# Patient Record
Sex: Female | Born: 1935
Health system: Southern US, Community
[De-identification: ages and names within clinical notes are randomized; demographics above are authoritative.]

## PROBLEM LIST (undated history)

## (undated) DIAGNOSIS — F419 Anxiety disorder, unspecified: Secondary | ICD-10-CM

## (undated) DIAGNOSIS — G629 Polyneuropathy, unspecified: Secondary | ICD-10-CM

## (undated) DIAGNOSIS — F32A Depression, unspecified: Secondary | ICD-10-CM

## (undated) DIAGNOSIS — E785 Hyperlipidemia, unspecified: Secondary | ICD-10-CM

## (undated) DIAGNOSIS — I6529 Occlusion and stenosis of unspecified carotid artery: Secondary | ICD-10-CM

## (undated) DIAGNOSIS — D649 Anemia, unspecified: Secondary | ICD-10-CM

## (undated) DIAGNOSIS — I4892 Unspecified atrial flutter: Secondary | ICD-10-CM

## (undated) DIAGNOSIS — K222 Esophageal obstruction: Secondary | ICD-10-CM

## (undated) DIAGNOSIS — E079 Disorder of thyroid, unspecified: Secondary | ICD-10-CM

## (undated) DIAGNOSIS — E669 Obesity, unspecified: Secondary | ICD-10-CM

## (undated) DIAGNOSIS — IMO0002 Reserved for concepts with insufficient information to code with codable children: Secondary | ICD-10-CM

## (undated) DIAGNOSIS — I10 Essential (primary) hypertension: Secondary | ICD-10-CM

## (undated) DIAGNOSIS — H348112 Central retinal vein occlusion, right eye, stable: Secondary | ICD-10-CM

## (undated) DIAGNOSIS — J449 Chronic obstructive pulmonary disease, unspecified: Secondary | ICD-10-CM

## (undated) DIAGNOSIS — K219 Gastro-esophageal reflux disease without esophagitis: Secondary | ICD-10-CM

## (undated) DIAGNOSIS — F329 Major depressive disorder, single episode, unspecified: Secondary | ICD-10-CM

## (undated) DIAGNOSIS — N189 Chronic kidney disease, unspecified: Secondary | ICD-10-CM

## (undated) DIAGNOSIS — I5032 Chronic diastolic (congestive) heart failure: Secondary | ICD-10-CM

## (undated) DIAGNOSIS — K8020 Calculus of gallbladder without cholecystitis without obstruction: Secondary | ICD-10-CM

## (undated) DIAGNOSIS — K449 Diaphragmatic hernia without obstruction or gangrene: Secondary | ICD-10-CM

## (undated) DIAGNOSIS — I272 Pulmonary hypertension, unspecified: Secondary | ICD-10-CM

## (undated) DIAGNOSIS — C801 Malignant (primary) neoplasm, unspecified: Secondary | ICD-10-CM

## (undated) DIAGNOSIS — I350 Nonrheumatic aortic (valve) stenosis: Secondary | ICD-10-CM

## (undated) HISTORY — DX: Malignant (primary) neoplasm, unspecified: C80.1

## (undated) HISTORY — DX: Polyneuropathy, unspecified: G62.9

## (undated) HISTORY — PX: ABDOMINAL HYSTERECTOMY: SHX81

## (undated) HISTORY — PX: PR VEIN BYPASS GRAFT,AORTO-FEM-POP: 35551

## (undated) HISTORY — DX: Depression, unspecified: F32.A

## (undated) HISTORY — DX: Disorder of thyroid, unspecified: E07.9

## (undated) HISTORY — PX: HIP SURGERY: SHX245

## (undated) HISTORY — DX: Nonrheumatic aortic (valve) stenosis: I35.0

## (undated) HISTORY — PX: BREAST LUMPECTOMY: SHX2

## (undated) HISTORY — DX: Gastro-esophageal reflux disease without esophagitis: K21.9

## (undated) HISTORY — DX: Obesity, unspecified: E66.9

## (undated) HISTORY — DX: Essential (primary) hypertension: I10

## (undated) HISTORY — PX: CHOLECYSTECTOMY: SHX55

## (undated) HISTORY — DX: Esophageal obstruction: K22.2

## (undated) HISTORY — DX: Chronic obstructive pulmonary disease, unspecified: J44.9

## (undated) HISTORY — DX: Calculus of gallbladder without cholecystitis without obstruction: K80.20

## (undated) HISTORY — DX: Hyperlipidemia, unspecified: E78.5

## (undated) HISTORY — DX: Reserved for concepts with insufficient information to code with codable children: IMO0002

## (undated) HISTORY — DX: Major depressive disorder, single episode, unspecified: F32.9

## (undated) HISTORY — DX: Anxiety disorder, unspecified: F41.9

## (undated) HISTORY — DX: Occlusion and stenosis of unspecified carotid artery: I65.29

## (undated) HISTORY — DX: Unspecified atrial flutter: I48.92

## (undated) HISTORY — DX: Pulmonary hypertension, unspecified: I27.20

## (undated) HISTORY — DX: Chronic diastolic (congestive) heart failure: I50.32

## (undated) HISTORY — DX: Diaphragmatic hernia without obstruction or gangrene: K44.9

## (undated) HISTORY — DX: Anemia, unspecified: D64.9

## (undated) HISTORY — DX: Central retinal vein occlusion, right eye, stable: H34.8112

## (undated) HISTORY — DX: Chronic kidney disease, unspecified: N18.9

---

## 1992-03-09 DIAGNOSIS — C801 Malignant (primary) neoplasm, unspecified: Secondary | ICD-10-CM

## 1992-03-09 HISTORY — DX: Malignant (primary) neoplasm, unspecified: C80.1

## 1997-08-30 ENCOUNTER — Ambulatory Visit: Admission: RE | Admit: 1997-08-30 | Discharge: 1997-08-30 | Payer: Self-pay | Admitting: Vascular Surgery

## 1997-08-31 ENCOUNTER — Inpatient Hospital Stay: Admission: RE | Admit: 1997-08-31 | Discharge: 1997-09-04 | Payer: Self-pay | Admitting: Vascular Surgery

## 1997-10-23 ENCOUNTER — Observation Stay (HOSPITAL_COMMUNITY): Admission: RE | Admit: 1997-10-23 | Discharge: 1997-10-24 | Payer: Self-pay | Admitting: Vascular Surgery

## 1997-10-29 ENCOUNTER — Inpatient Hospital Stay (HOSPITAL_COMMUNITY): Admission: RE | Admit: 1997-10-29 | Discharge: 1997-11-02 | Payer: Self-pay | Admitting: Vascular Surgery

## 1998-02-04 ENCOUNTER — Ambulatory Visit (HOSPITAL_COMMUNITY): Admission: RE | Admit: 1998-02-04 | Discharge: 1998-02-04 | Payer: Self-pay | Admitting: Vascular Surgery

## 1998-02-04 ENCOUNTER — Encounter: Payer: Self-pay | Admitting: Vascular Surgery

## 1998-02-05 ENCOUNTER — Ambulatory Visit: Admission: RE | Admit: 1998-02-05 | Discharge: 1998-02-05 | Payer: Self-pay | Admitting: Vascular Surgery

## 1998-02-06 ENCOUNTER — Inpatient Hospital Stay: Admission: RE | Admit: 1998-02-06 | Discharge: 1998-02-09 | Payer: Self-pay | Admitting: Vascular Surgery

## 1998-02-06 ENCOUNTER — Encounter: Payer: Self-pay | Admitting: Vascular Surgery

## 1998-05-23 ENCOUNTER — Encounter: Payer: Self-pay | Admitting: Vascular Surgery

## 1998-05-23 ENCOUNTER — Inpatient Hospital Stay: Admission: RE | Admit: 1998-05-23 | Discharge: 1998-05-27 | Payer: Self-pay | Admitting: Vascular Surgery

## 1998-08-19 ENCOUNTER — Inpatient Hospital Stay: Admission: RE | Admit: 1998-08-19 | Discharge: 1998-08-27 | Payer: Self-pay | Admitting: Vascular Surgery

## 1998-08-19 ENCOUNTER — Encounter: Payer: Self-pay | Admitting: Vascular Surgery

## 1998-08-22 ENCOUNTER — Encounter: Payer: Self-pay | Admitting: Vascular Surgery

## 1998-09-19 ENCOUNTER — Encounter: Payer: Self-pay | Admitting: Vascular Surgery

## 1998-09-19 ENCOUNTER — Inpatient Hospital Stay: Admission: RE | Admit: 1998-09-19 | Discharge: 1998-09-25 | Payer: Self-pay | Admitting: Vascular Surgery

## 1999-05-07 ENCOUNTER — Encounter: Payer: Self-pay | Admitting: Vascular Surgery

## 1999-05-08 ENCOUNTER — Ambulatory Visit: Admission: RE | Admit: 1999-05-08 | Discharge: 1999-05-08 | Payer: Self-pay | Admitting: Vascular Surgery

## 1999-05-09 ENCOUNTER — Inpatient Hospital Stay: Admission: RE | Admit: 1999-05-09 | Discharge: 1999-05-11 | Payer: Self-pay | Admitting: Vascular Surgery

## 1999-11-13 ENCOUNTER — Encounter: Payer: Self-pay | Admitting: Vascular Surgery

## 1999-11-14 ENCOUNTER — Ambulatory Visit: Admission: RE | Admit: 1999-11-14 | Discharge: 1999-11-14 | Payer: Self-pay | Admitting: Vascular Surgery

## 1999-11-17 ENCOUNTER — Encounter: Payer: Self-pay | Admitting: Vascular Surgery

## 1999-11-17 ENCOUNTER — Inpatient Hospital Stay: Admission: RE | Admit: 1999-11-17 | Discharge: 1999-11-21 | Payer: Self-pay | Admitting: Vascular Surgery

## 2002-04-02 ENCOUNTER — Inpatient Hospital Stay (HOSPITAL_COMMUNITY): Admission: EM | Admit: 2002-04-02 | Discharge: 2002-04-10 | Payer: Self-pay | Admitting: Emergency Medicine

## 2002-04-02 ENCOUNTER — Encounter: Payer: Self-pay | Admitting: Orthopaedic Surgery

## 2002-04-02 ENCOUNTER — Encounter: Payer: Self-pay | Admitting: *Deleted

## 2002-04-03 ENCOUNTER — Encounter: Payer: Self-pay | Admitting: Orthopaedic Surgery

## 2002-04-10 ENCOUNTER — Inpatient Hospital Stay (HOSPITAL_COMMUNITY)
Admission: RE | Admit: 2002-04-10 | Discharge: 2002-04-14 | Payer: Self-pay | Admitting: Physical Medicine & Rehabilitation

## 2004-12-01 ENCOUNTER — Encounter: Admission: RE | Admit: 2004-12-01 | Discharge: 2004-12-01 | Payer: Self-pay | Admitting: Family Medicine

## 2005-06-25 ENCOUNTER — Encounter: Admission: RE | Admit: 2005-06-25 | Discharge: 2005-06-25 | Payer: Self-pay | Admitting: Family Medicine

## 2005-07-31 ENCOUNTER — Encounter: Admission: RE | Admit: 2005-07-31 | Discharge: 2005-07-31 | Payer: Self-pay | Admitting: Family Medicine

## 2005-07-31 ENCOUNTER — Inpatient Hospital Stay (HOSPITAL_COMMUNITY): Admission: EM | Admit: 2005-07-31 | Discharge: 2005-08-07 | Payer: Self-pay | Admitting: Emergency Medicine

## 2005-08-04 ENCOUNTER — Encounter (INDEPENDENT_AMBULATORY_CARE_PROVIDER_SITE_OTHER): Payer: Self-pay | Admitting: *Deleted

## 2005-08-12 ENCOUNTER — Encounter: Admission: RE | Admit: 2005-08-12 | Discharge: 2005-08-12 | Payer: Self-pay | Admitting: Surgery

## 2005-09-07 ENCOUNTER — Encounter: Admission: RE | Admit: 2005-09-07 | Discharge: 2005-09-07 | Payer: Self-pay | Admitting: Surgery

## 2005-11-11 ENCOUNTER — Ambulatory Visit (HOSPITAL_BASED_OUTPATIENT_CLINIC_OR_DEPARTMENT_OTHER): Admission: RE | Admit: 2005-11-11 | Discharge: 2005-11-11 | Payer: Self-pay | Admitting: Surgery

## 2006-03-09 DIAGNOSIS — I4892 Unspecified atrial flutter: Secondary | ICD-10-CM

## 2006-03-09 HISTORY — DX: Unspecified atrial flutter: I48.92

## 2006-04-07 ENCOUNTER — Encounter: Admission: RE | Admit: 2006-04-07 | Discharge: 2006-04-07 | Payer: Self-pay | Admitting: Endocrinology

## 2006-05-10 ENCOUNTER — Inpatient Hospital Stay (HOSPITAL_COMMUNITY): Admission: EM | Admit: 2006-05-10 | Discharge: 2006-05-17 | Payer: Self-pay | Admitting: Emergency Medicine

## 2006-05-11 ENCOUNTER — Encounter (INDEPENDENT_AMBULATORY_CARE_PROVIDER_SITE_OTHER): Payer: Self-pay | Admitting: Specialist

## 2006-07-06 ENCOUNTER — Ambulatory Visit: Payer: Self-pay | Admitting: Vascular Surgery

## 2006-07-21 ENCOUNTER — Encounter: Admission: RE | Admit: 2006-07-21 | Discharge: 2006-07-21 | Payer: Self-pay | Admitting: Endocrinology

## 2006-11-26 ENCOUNTER — Ambulatory Visit: Payer: Self-pay | Admitting: Vascular Surgery

## 2006-12-10 ENCOUNTER — Encounter: Admission: RE | Admit: 2006-12-10 | Discharge: 2006-12-10 | Payer: Self-pay | Admitting: Gastroenterology

## 2007-03-10 DIAGNOSIS — IMO0002 Reserved for concepts with insufficient information to code with codable children: Secondary | ICD-10-CM

## 2007-03-10 HISTORY — DX: Reserved for concepts with insufficient information to code with codable children: IMO0002

## 2007-03-17 ENCOUNTER — Ambulatory Visit (HOSPITAL_COMMUNITY): Admission: RE | Admit: 2007-03-17 | Discharge: 2007-03-18 | Payer: Self-pay | Admitting: Surgery

## 2007-03-17 ENCOUNTER — Encounter (INDEPENDENT_AMBULATORY_CARE_PROVIDER_SITE_OTHER): Payer: Self-pay | Admitting: Surgery

## 2007-06-07 ENCOUNTER — Ambulatory Visit: Payer: Self-pay | Admitting: Vascular Surgery

## 2007-11-09 ENCOUNTER — Ambulatory Visit: Payer: Self-pay | Admitting: Vascular Surgery

## 2008-03-29 ENCOUNTER — Encounter: Admission: RE | Admit: 2008-03-29 | Discharge: 2008-03-29 | Payer: Self-pay | Admitting: Family Medicine

## 2008-05-21 ENCOUNTER — Encounter: Admission: RE | Admit: 2008-05-21 | Discharge: 2008-05-21 | Payer: Self-pay | Admitting: Family Medicine

## 2008-06-15 ENCOUNTER — Ambulatory Visit: Payer: Self-pay | Admitting: Vascular Surgery

## 2008-12-11 ENCOUNTER — Ambulatory Visit: Payer: Self-pay | Admitting: Vascular Surgery

## 2009-02-05 ENCOUNTER — Emergency Department (HOSPITAL_COMMUNITY): Admission: EM | Admit: 2009-02-05 | Discharge: 2009-02-05 | Payer: Self-pay | Admitting: Emergency Medicine

## 2009-02-06 HISTORY — PX: WRIST SURGERY: SHX841

## 2009-02-11 ENCOUNTER — Inpatient Hospital Stay (HOSPITAL_COMMUNITY): Admission: EM | Admit: 2009-02-11 | Discharge: 2009-02-13 | Payer: Self-pay | Admitting: Emergency Medicine

## 2009-03-04 ENCOUNTER — Encounter: Payer: Self-pay | Admitting: Plastic Surgery

## 2009-03-07 ENCOUNTER — Ambulatory Visit (HOSPITAL_COMMUNITY): Admission: RE | Admit: 2009-03-07 | Discharge: 2009-03-07 | Payer: Self-pay | Admitting: Plastic Surgery

## 2009-07-02 ENCOUNTER — Ambulatory Visit: Payer: Self-pay | Admitting: Vascular Surgery

## 2010-01-17 ENCOUNTER — Ambulatory Visit: Payer: Self-pay | Admitting: Vascular Surgery

## 2010-03-30 ENCOUNTER — Encounter: Payer: Self-pay | Admitting: Endocrinology

## 2010-06-09 LAB — BASIC METABOLIC PANEL
BUN: 24 mg/dL — ABNORMAL HIGH (ref 6–23)
Creatinine, Ser: 1.34 mg/dL — ABNORMAL HIGH (ref 0.4–1.2)
GFR calc non Af Amer: 39 mL/min — ABNORMAL LOW (ref >60)

## 2010-06-09 LAB — GLUCOSE, CAPILLARY
Glucose-Capillary: 113 mg/dL — ABNORMAL HIGH (ref 70–99)
Glucose-Capillary: 133 mg/dL — ABNORMAL HIGH (ref 70–99)

## 2010-06-09 LAB — CBC
HCT: 37.2 % (ref 36.0–46.0)
Platelets: 218 10*3/uL (ref 150–400)
RDW: 12.8 % (ref 11.5–15.5)

## 2010-06-10 LAB — BRAIN NATRIURETIC PEPTIDE: Pro B Natriuretic peptide (BNP): 114 pg/mL — ABNORMAL HIGH (ref 0.0–100.0)

## 2010-06-10 LAB — GLUCOSE, CAPILLARY
Glucose-Capillary: 150 mg/dL — ABNORMAL HIGH (ref 70–99)
Glucose-Capillary: 161 mg/dL — ABNORMAL HIGH (ref 70–99)
Glucose-Capillary: 165 mg/dL — ABNORMAL HIGH (ref 70–99)
Glucose-Capillary: 166 mg/dL — ABNORMAL HIGH (ref 70–99)
Glucose-Capillary: 179 mg/dL — ABNORMAL HIGH (ref 70–99)
Glucose-Capillary: 200 mg/dL — ABNORMAL HIGH (ref 70–99)
Glucose-Capillary: 201 mg/dL — ABNORMAL HIGH (ref 70–99)
Glucose-Capillary: 254 mg/dL — ABNORMAL HIGH (ref 70–99)

## 2010-06-10 LAB — DIFFERENTIAL
Basophils Absolute: 0 10*3/uL (ref 0.0–0.1)
Basophils Relative: 0 % (ref 0–1)
Monocytes Relative: 3 % (ref 3–12)
Neutro Abs: 6 10*3/uL (ref 1.7–7.7)
Neutrophils Relative %: 77 % (ref 43–77)

## 2010-06-10 LAB — BASIC METABOLIC PANEL
Calcium: 9 mg/dL (ref 8.4–10.5)
Calcium: 9.7 mg/dL (ref 8.4–10.5)
Creatinine, Ser: 1.63 mg/dL — ABNORMAL HIGH (ref 0.4–1.2)
GFR calc Af Amer: 38 mL/min — ABNORMAL LOW (ref >60)
GFR calc non Af Amer: 31 mL/min — ABNORMAL LOW (ref >60)
GFR calc non Af Amer: 32 mL/min — ABNORMAL LOW (ref >60)
Glucose, Bld: 300 mg/dL — ABNORMAL HIGH (ref 70–99)
Potassium: 4 mEq/L (ref 3.5–5.1)
Sodium: 137 mEq/L (ref 135–145)
Sodium: 139 mEq/L (ref 135–145)

## 2010-06-10 LAB — TSH: TSH: 4.422 u[IU]/mL (ref 0.350–4.500)

## 2010-06-10 LAB — POCT CARDIAC MARKERS: Myoglobin, poc: 140 ng/mL (ref 12–200)

## 2010-06-10 LAB — CBC
MCHC: 34.4 g/dL (ref 30.0–36.0)
Platelets: 202 10*3/uL (ref 150–400)
RBC: 3.61 MIL/uL — ABNORMAL LOW (ref 3.87–5.11)

## 2010-06-10 LAB — PROTIME-INR
INR: 1.16 (ref 0.00–1.49)
Prothrombin Time: 14.7 seconds (ref 11.6–15.2)

## 2010-06-10 LAB — CK TOTAL AND CKMB (NOT AT ARMC): Relative Index: 0.7 (ref 0.0–2.5)

## 2010-06-10 LAB — TROPONIN I

## 2010-06-10 LAB — CARDIAC PANEL(CRET KIN+CKTOT+MB+TROPI): CK, MB: 0.6 ng/mL (ref 0.3–4.0)

## 2010-07-22 NOTE — Procedures (Signed)
BYPASS GRAFT EVALUATION   INDICATION:  Left calf claudication, which has not changed since the  previous study.   HISTORY:  Diabetes:  No.  Cardiac:  No.  Hypertension:  Yes.  Smoking:  No.  Previous Surgery:  Dacron patch angioplasty of left common femoral  artery and profunda femoris artery.  Extension of left fem-to-peroneal  artery bypass graft to a distal portion of the peroneal artery on  11/17/99.   SINGLE LEVEL ARTERIAL EXAM                               RIGHT              LEFT  Brachial:                    200                200  Anterior tibial:             136                174  Posterior tibial:            156                168  Peroneal:                                       162  Ankle/brachial index:        0.78               0.87   PREVIOUS ABI:  Date: 06/07/07  RIGHT:  0.63  LEFT:  0.87   LOWER EXTREMITY BYPASS GRAFT DUPLEX EXAM:   DUPLEX:  Doppler arterial waveforms are biphasic proximal to, within,  and distal to the left fem-peroneal artery bypass graft.   IMPRESSION:  1. Ankle brachial indices are stable from the previous study      bilaterally.  2. Patent left femoral-to-peroneal artery bypass graft.   ___________________________________________  Nelda Severe. Kellie Simmering, M.D.   MC/MEDQ  D:  11/09/2007  T:  11/09/2007  Job:  [id number]

## 2010-07-22 NOTE — Procedures (Signed)
BYPASS GRAFT EVALUATION   INDICATION:  Follow up, left leg bypass graft.   HISTORY:  Diabetes:  No.  Cardiac:  No.  Hypertension:  Yes.  Smoking:  No.  Previous Surgery:  DPA of left common femoral and profunda femoris  arteries and extension of previous left femoral to peroneal artery  bypass graft to a more distal peroneal artery with reverse saphenous  vein on 11/17/1999 by Dr. Kellie Simmering.   SINGLE LEVEL ARTERIAL EXAM                               RIGHT              LEFT  Brachial:                    190                196  Anterior tibial:             140                146  Posterior tibial:            160                146  Peroneal:                                       164  Ankle/brachial index:        0.82               0.84   PREVIOUS ABI:  Date: 07/06/2006  RIGHT:  0.83  LEFT:  0.88   LOWER EXTREMITY BYPASS GRAFT DUPLEX EXAM:   DUPLEX:  Doppler arterial waveforms are monophasic and biphasic proximal  to, throughout, and distal to the graft.  Velocities are within normal  limits throughout.   IMPRESSION:  1. Patent left femoral to peroneal artery bypass graft.  2. Ankle brachial indices are stable bilaterally from previous exam.   ___________________________________________  Nelda Severe. Kellie Simmering, M.D.   DP/MEDQ  D:  11/26/2006  T:  11/27/2006  Job:  [id number]

## 2010-07-22 NOTE — Procedures (Signed)
BYPASS GRAFT EVALUATION   INDICATION:  Follow-up evaluation of left leg bypass graft.   HISTORY:  Diabetes:  No.  Cardiac:  No.  Hypertension:  Yes.  Smoking:  No.  Previous Surgery:  Dacron patch angioplasty of left common femoral  artery and profunda femoris artery and extension of left fem-to-peroneal  artery bypass graft to a distal anastomosis site of the peroneal artery  with reverse saphenous vein on 11/17/99 by Dr. Kellie Simmering.   SINGLE LEVEL ARTERIAL EXAM                               RIGHT              LEFT  Brachial:                    170                166  Anterior tibial:             100                148  Posterior tibial:            106                140  Peroneal:                                       148  Ankle/brachial index:        0.63               0.87   PREVIOUS ABI:  Date: 11/26/06  RIGHT:  0.82  LEFT:  0.84   LOWER EXTREMITY BYPASS GRAFT DUPLEX EXAM:   DUPLEX:  Doppler arterial waveforms are biphasic proximal to, within,  and distal to the left fem-to-peroneal artery bypass graft.   IMPRESSION:  1. Patent left femoral-to-peroneal artery bypass graft.  2. Ankle brachial indices are stable from previous studies      bilaterally.   ___________________________________________  Nelda Severe Kellie Simmering, M.D.   MC/MEDQ  D:  06/07/2007  T:  06/07/2007  Job:  [id number]

## 2010-07-22 NOTE — Procedures (Signed)
BYPASS GRAFT EVALUATION   INDICATION:  Followup left lower extremity bypass graft.   HISTORY:  Diabetes:  Yes.  Cardiac:  No.  Hypertension:  Yes.  Smoking:  Previous.  Previous Surgery:  Extension of left femoral to peroneal artery bypass  graft on 11/17/1999.   SINGLE LEVEL ARTERIAL EXAM                               RIGHT              LEFT  Brachial:                    169                172  Anterior tibial:             136                145  Posterior tibial:            131                132  Peroneal:                                       137  Ankle/brachial index:        0.79               0.84   PREVIOUS ABI:  Date:  07/02/2009  RIGHT:  0.76  LEFT:  0.88   LOWER EXTREMITY BYPASS GRAFT DUPLEX EXAM:   DUPLEX:  Monophasic Doppler waveforms noted throughout the left lower  extremity bypass graft and its native vessels with no increase in  velocities.   IMPRESSION:  1. Patent left femoral to peroneal artery bypass graft with no      evidence of stenosis.  2. Stable bilateral ankle brachial indices.   ___________________________________________  Nelda Severe Kellie Simmering, M.D.   CH/MEDQ  D:  01/17/2010  T:  01/17/2010  Job:  [id number]

## 2010-07-22 NOTE — Consult Note (Signed)
NEW PATIENT CONSULTATION   Jackie Alvarez, Jackie Alvarez  DOB:  [birth year]                                       07/02/2009  [id number]   REASON FOR VISIT:  Follow up peripheral vascular disease with history of  left femoral-peroneal bypass grafting.   Patient is a 77 year old Caucasian female with history of peripheral  vascular disease and aortoiliac occlusive disease.  She is status post  aortofemoral bypass grafting by Dr. Kellie Simmering in November 1998.  She then  underwent a left common femoral to popliteal above-knee bypass using a  vein from the left leg by Dr. Kellie Simmering in March 1999.  The left lower  extremity bypass showed signs of failing in June 1999.  She required  conversion of the left femoral above-knee popliteal bypass to a below-  knee popliteal bypass using vein from the right leg.  She also underwent  Dacron patch angioplasty of the right common femoral and proximal  superficial femoral artery at that time.  In August 1999, the bypass was  noted to be occluded, and she underwent left common femoral to tibial  peroneal trunk bypass using vein from the right leg and a vein patch  angioplasty of the distal portion of the femoral to tibial peroneal  trunk bypass graft for an area of narrowing.  She had a Dacron patch  angioplasty of left common and profunda femoris artery and  profundoplasty and limited endarterectomy of the left tibial peroneal  trunk.  Again, this graft showed signs of failing, and in December 1999,  the bypass was converted to a femoral to anterior tibial bypass using  vein from the left leg.  In March 2000, there was distal vein graft  stenosis.  Again, she was taken to the operating room for extension of  the left femoral to anterior tibial bypass to a more distal side of the  anterior tibial artery using vein.  This graft occluded, and in June  2000 this was converted to femoral to distal anterior tibial bypass  using 6 mm Gore-Tex.  In July  2000, again she required redo bypass, this  time left profunda femoris to peroneal artery bypass using reverse  lesser saphenous vein graft from the left leg.  In September 2001, she  had Dacron patch angioplasty of the left common femoral and profunda  femoral artery with profundoplasty and extension of the left femoral  peroneal bypass to a more distal side of the peroneal artery using her  reverse lesser saphenous vein graft in the right leg.  Fortunately this  graft has remained patent.  She has not actually been seen by Dr. Kellie Simmering  since October 2001.  She has, however, been coming to our office  approximately every 6 months for left lower extremity graft scans and  ABIs.  Today she is here for a routine followup.   Her history is significant for hysterectomy, breast cancer on the left,  diabetes mellitus type 2, hypertension, hemorrhoids, hypercholesteremia,  left hip fracture in January 2004, sacral decubitus in September 2007,  upper GI bleed in March 2008 secondary to gastritis, laparoscopic  cholecystectomy in January 2009, hospitalization for bradycardia believe  related to beta blocker therapy in December 2010 and right radial  fracture in December 2010.   FAMILY HISTORY:  Her mother died of stomach cancer at age 55.  Father  died at 45 for liver cancer.  She denies any premature cardiovascular  disease in her first-degree relatives.   SOCIAL HISTORY:  She is widowed with 1 child.  She is retired.  She quit  smoking on 01/26/1997 and smoked approximately 1 pack per day for 45  years prior to that.  She does not drink alcohol on a regular basis.   REVIEW OF SYSTEMS:  She reports of some weight gain with her weight now  at 180 pounds with a height of 5 feet 7 inches.  CARDIAC:  She denies any chest pain.  She did have bradycardia as  mentioned, but none since adjusting her carvedilol.  PULMONARY:  She denies any shortness of breath.  GI:  She denies any hematochezia.  GU:   She denies any dysuria.  VASCULAR:  She does report claudication in both legs about equal in  severity.  She is able to walk about 1 to 2 blocks before having a rest.  She denies any rest pain or nonhealing ulcers.  She said her  claudication symptoms have been stable for the last 10 years.  NEUROLOGIC:  She denies any syncope.  MUSCULOSKELETAL:  She does describe some chronic right wrist pain since  her fracture.  She has been seen at the local hand clinic.  PSYCHIATRIC:  She denies any anxiety or depression.  EENT:  She denies any visual changes.  HEMATOLOGIC:  She does report that she bruises easily.   MEDICATIONS:  Include amlodipine 10 mg daily, carvedilol 6.25 mg b.i.d.,  glipizide 5 mg 2 tablets, Actos 30 mg p.o. q.a.m., benazepril 40 mg p.o.  q.a.m., iron 665 mg p.o. daily, mirtazapine 15 mg p.o. q.a.m.,  omeprazole 20 mg p.o. q.a.m., Vytorin 10/20 mg p.o. daily, Os-Cal 50  with vitamin D 1 tablet b.i.d., hydrochlorothiazide 25 mg p.o. daily.   Bypass graft evaluation today showed ABI of 0.76 on the right and 0.88  on the left.  Her previous ABI was 0.84 on the right and 0.88 on the  left 6 months ago.  These ABIs were based on peroneal artery readings.  Duplex showed that there is patent left femoral-peroneal bypass without  evidence of stenosis.   Physical exam findings show blood pressure 170/65, heart rate 57,  respirations 20.  She is alert, pleasant, in no acute distress.  She  appears her stated age.  Her head is normocephalic atraumatic.  Pupils  are equal.  Sclera nonicteric.  Oral mucosa is pink and moist.  She does  wear dentures.  Lung sounds are clear throughout.  Her heart has a  regular rhythm.  No carotid bruits were auscultated.  She does have mild  bilateral lower extremity edema.  She has 2+ radial pulses.  Abdomen is  soft, nontender with good bowel sounds.  She has a well-healed midline  incisional scar.  Musculoskeletal shows no gross deformities.  She  does  have multiple scars on her lower extremities and her right wrist.  She  does have evidence of left second toe amputation which auto-amputated,  by her report.  She does have palpable femoral pulses 2+ bilaterally.  She has biphasic peroneal, posterior tibial, and dorsalis pedis pulses  bilaterally.  Neurologic:  Grossly intact.  No focal weakness was noted.  Skin:  The skin showed no obvious rashes.  There was no cervical  lymphadenopathy appreciated.   Patient continues to experience bilateral lower extremity claudication,  but none that she feels limiting to her activities of  daily living.  Her  symptoms have remained stable for approximately 10 years.  Her graft  remains patent with stable ABIs.  We will plan to continue to follow her  on up on a 48-month basis.  She will continue to follow up with her  family physician and cardiology for her hypertension and bradycardia.   Jacinta Shoe, PA   Nelda Severe. Kellie Simmering, M.D.  Electronically Signed   AWZ/MEDQ  D:  07/02/2009  T:  07/02/2009  Job:  [id number]   cc:   Teodoro Kil. Jonny Ruiz, M.D.  Fransico Him, M.D.

## 2010-07-22 NOTE — Procedures (Signed)
BYPASS GRAFT EVALUATION   INDICATION:  Followup of a left femoral to peroneal bypass graft.   HISTORY:  Diabetes:  No.  Cardiac:  No.  Hypertension:  Yes.  Smoking:  No.  Previous Surgery:  Angioplasty with fem-to-peroneal bypass graft on  11/17/99.   SINGLE LEVEL ARTERIAL EXAM                               RIGHT              LEFT  Brachial:                    216                190  Anterior tibial:             141                189  Posterior tibial:            182                182  Peroneal:  Ankle/brachial index:        0.84               0.88   PREVIOUS ABI:  Date:  06/15/08  RIGHT:  0.81  LEFT:  0.87   LOWER EXTREMITY BYPASS GRAFT DUPLEX EXAM:   DUPLEX:  Bypass graft is patent with no stenosis noted.   IMPRESSION:  Ankle brachial indices bilaterally suggest mild disease.   ___________________________________________  Nelda Severe. Kellie Simmering, M.D.   CJ/MEDQ  D:  12/11/2008  T:  12/12/2008  Job:  [id number]

## 2010-07-22 NOTE — Op Note (Signed)
Jackie Alvarez, Jackie Alvarez                 ACCOUNT NO.:  [id number]   MEDICAL RECORD NO.:  [id number]          PATIENT TYPE:  AMB   LOCATION:  DAY                          FACILITY:  [facility]   PHYSICIAN:  Fenton Malling. Lucia Gaskins, M.D.  DATE OF BIRTH:  [birth year]   DATE OF PROCEDURE:  03/17/2007  DATE OF DISCHARGE:                               OPERATIVE REPORT   PREOPERATIVE DIAGNOSIS:  Chronic cholecystitis and cholelithiasis.   POSTOPERATIVE DIAGNOSES:  1. Chronic cholecystitis and cholelithiasis.  2. Intra-abdominal adhesions secondary to her prior abdominal surgery.   PROCEDURE:  Laparoscopic cholecystectomy with intraoperative  cholangiogram.   SURGEON:  Fenton Malling. Lucia Gaskins, M.D.   FIRST ASSISTANT:  Isabel Caprice. Hassell Done, MD   ANESTHESIA:  General endotracheal with approximately 15 mL of 0.25%  Marcaine.   INDICATION FOR PROCEDURE:  Ms. Sieminski is a 77 year old white female  patient of Dr. Barney Drain.  She has had some epigastric abdominal  pain which began about 2 or 3 months ago when she ate at the [employer].  She saw Dr. Cristina Gong from a GI standpoint, who obtained an  ultrasound showing multiple gallstones.  She now comes for attempted  laparoscopic cholecystectomy.   The indications and potential complications of surgery were explained to  the patient.  Potential complications include but are not limited to  bleeding, infection, bile duct injury, the possibility of open surgery.   OPERATIVE NOTE:  The patient placed in a supine position, given a  general endotracheal anesthetic.  She was given a gram of Ancef at the  initiation of this procedure.  Because of a questionable BETADINE  allergy, her abdomen was prepped with Hibiclens and sterilely draped.  A  time out was held identigying patient and procedure.   She had a long midline incision from a prior abdominal aortic aneurysm  done by Nelda Severe. Kellie Simmering, M.D., in [phone].  I went through an  infraumbilical incision.  I encountered some  of these adhesions in the  midline.  I was able, however, to get a Hasson trocar and secure this  with 0 Vicryl suture.   I did place three additional trocars, a 10-mm subxiphoid trocar, a 5-mm  right midsubcostal and a 5-mm lateral subcostal trocar.  I spent about  15 minutes taking down some adhesions from her prior aortic surgery,  which included taking this down off the liver edge and the midline.  This appeared to be really related to her old surgery and not related to  her current gallbladder condition.   I then was able to grab the gallbladder, rotate it cephalad, identify  the gallbladder-cystic junction and place a clip on the gallbladder side  of the cystic duct.   I then shot an intraoperative cholangiogram using a cut-off Taut  catheter.  The Taut catheter was inserted into the abdominal cavity  through a 14-gauge Jelco catheter and secured on the side of the cut  cystic duct with an endoclip.   The intraoperative cholangiogram using about 8 mL of half-strength  renograffin showed free flow of contrast down a fairly long  cystic duct  into the common bile duct, into the duodenum, out the hepatic radicles.  There was no filling defect, no mass, and this was felt to be a normal  intraoperative cholangiogram.   The Taut catheter was then removed, the cystic duct triply endoclipped  and divided, the cystic duct divided, and the gallbladder was then  sharply and bluntly dissected from the gallbladder bed using primarily  hook coagulation.   Prior to completely removing the gallbladder from the gallbladder bed, I  revisualized the triangle of Calot.  I revisualized the gallbladder bed,  saw no bleeding, no bile leak.  I put the gallbladder in an EndoCatch  bag, delivered it through the umbilicus.  I irrigated the liver and  gallbladder bed with saline and saw no active bleeding or bile leak.   The trocars were then removed in turn, the umbilical port closed with a  0 Vicryl  suture, the skin at each site closed with a 5-0 Monocryl  suture, and the wounds painted with Dermabond.   The patient tolerated the procedure well, was transported to the  recovery room in good condition.  Of note, her preop blood glucose was  about 280.  I think her diabetes is probably poorly-controlled.  We will  check a hemoglobin A1c on her today.  I will tell her to get in touch  with her primary care doctor when she leaves the hospital.      Fenton Malling. Lucia Gaskins, M.D.  Electronically Signed     DHN/MEDQ  D:  03/17/2007  T:  03/17/2007  Job:  [id number]   cc:   Janifer Adie, M.D.  Fax: [phone]   Ronald Lobo, M.D.  Fax: [phone]   Jacelyn Pi, M.D.  Fax: [phone]

## 2010-07-22 NOTE — Procedures (Signed)
BYPASS GRAFT EVALUATION   INDICATION:  Patient reports no lower extremity ischemic symptoms at  this time.   HISTORY:  Diabetes:  No.  Cardiac:  No.  Hypertension:  Yes.  Smoking:  No.  Previous Surgery:  Dacron patch angioplasty of left common femoral  artery and profunda femoris artery.  Extension of left fem-to-peroneal  artery bypass graft to a distal portion of the peroneal artery on  11/17/99.   SINGLE LEVEL ARTERIAL EXAM                               RIGHT              LEFT  Brachial:                    186                188  Anterior tibial:             138                146  Posterior tibial:            152                168  Peroneal:                                       168  Ankle/brachial index:        0.81               0.89   PREVIOUS ABI:  Date: 11/09/07  RIGHT:  0.78  LEFT:  0.87   LOWER EXTREMITY BYPASS GRAFT DUPLEX EXAM:   DUPLEX:  Doppler arterial waveforms are monophasic proximal, within, and  distal to the left fem-to-peroneal artery bypass graft.   IMPRESSION:  1. Ankle brachial indices are stable compared to previous study      bilaterally.  2. Patent left femoral-to-peroneal artery bypass graft with no      significant change since the previous study.   ___________________________________________  Nelda Severe Kellie Simmering, M.D.   MC/MEDQ  D:  06/15/2008  T:  06/15/2008  Job:  [id number]

## 2010-07-22 NOTE — Procedures (Signed)
BYPASS GRAFT EVALUATION   INDICATION:  Follow up left femoral-peroneal artery bypass graft.   HISTORY:  Diabetes:  Yes.  Cardiac:  No.  Hypertension:  Yes.  Smoking:  Previous.  Previous Surgery:  Extension of left femoral-peroneal artery bypass  graft to more distal portion of the peroneal artery on 11/17/1999 by Dr.  Kellie Simmering.   SINGLE LEVEL ARTERIAL EXAM                               RIGHT              LEFT  Brachial:                    179                178  Anterior tibial:             114                138  Posterior tibial:            129                141  Peroneal:                    136                158  Ankle/brachial index:        0.76               0.88   PREVIOUS ABI:  Date: 12/11/2008  RIGHT:  0.84  LEFT:  0.88   LOWER EXTREMITY BYPASS GRAFT DUPLEX EXAM:   DUPLEX:  1. Patent left femoral-peroneal artery bypass graft with no evidence      of stenosis.  2. Graft is very deep, mid-to-distal thigh.   IMPRESSION:  1. Patent left femoral-peroneal artery bypass graft.  2. Bilateral ankle brachial indices appear stable when compared to      previous studies.  3. No significant changes from previous study.   ___________________________________________  Nelda Severe Kellie Simmering, M.D.   AS/MEDQ  D:  07/02/2009  T:  07/02/2009  Job:  [id number]

## 2010-07-25 NOTE — Discharge Summary (Signed)
Jackie Alvarez, Jackie Alvarez                 ACCOUNT NO.:  [id number]   MEDICAL RECORD NO.:  [id number]          PATIENT TYPE:  INP   LOCATION:  [id number]                         FACILITY:  [facility]   PHYSICIAN:  Marcello Moores A. Cornett, M.D.DATE OF BIRTH:  02-Aug-1931   DATE OF ADMISSION:  07/31/2005  DATE OF DISCHARGE:  08/07/2005                                 DISCHARGE SUMMARY   ADMISSION DIAGNOSES:  1.  Weight loss.  2.  Anorexia.  3.  Free air fluid noted on abdominal CT scan per workup from above.   DISCHARGE DIAGNOSES:  1.  Weight loss.  2.  Anorexia.  3.  Free air fluid noted on abdominal CT scan per workup from above.   PROCEDURES:  1.  Upper gastrointestinal series.  2.  Upper endoscopy.  3.  Colonoscopy.  4.  Abdominal CT scan.   BRIEF HISTORY:  The patient is a 77 year old female with multiple medical  problems but has had a 2-3 month history with weight loss, history of mild  epigastric abdominal pain.  She is noted to be anemic and malnourished.  She  was seen by Dr. Bradd Burner and was worked up by and abdominal/pelvic CT scan  which showed free epigastric air as well as fluid.  She was then sent to the  hospital and admitted by me with this diagnosis.  Upon examination, the  patient was asymptomatic.  Unfortunately, her INR from being on Coumadin due  to her peripheral vascular disease was 11, and she was admitted to have this  reversed and any further workup.  She denied any abdominal pain and had a  completely benign examination.  At this point, her I reviewed her abdominal  CT scan and then consulted with Dr. Cristina Gong and Dr. Penelope Coop for further help.  Her CT scan again showed free air under her diaphragm and some air fluid  levels.  Upper GI showed some abnormality of the stomach and EGD was done.  There was minimal change except for some mild nodularity in her stomach, but  no evidence of perforation in the stomach or duodenum.  She had some mild  gastritis and her H.pylori studies  were negative.  Dr. Penelope Coop then performed  a colonoscopy which showed no abnormality.  Her examination was completely  normal during her hospital stay.  Her white count was 20,000, and she was  admitted.  After a week of antibiotics, it was down to 6.  She was anemic,  and this was partially because of not eating as well as iron deficiency it  looked like.  No obvious source for anemia could be identified.  At that  point in time, she was on TNA and this was stopped.  Her diet was advanced,  and she tolerated her diet without difficulty.  She had a benign  examination.  At this point in time I found no source of her free air at  this point in time.  She was discharged home on hospital day #7 in good  condition.  She had no evidence of fever.  Tachycardia was completely  asymptomatic.  DISCHARGE INSTRUCTIONS:  I will have her repeat her CT scan next week.  I  will premedicate her with prednisone, Benadryl and Tylenol due to her dye  allergy.  She will resume her home medications, and I will have the  hospitalist review that for [place] prior to discharge.  I will see her back in  10-14 days at this point in time since I have  found the source.  I do not think she needs anymore antibiotics since she  has had a week's worth and she has normal white count.  She will follow up  with her medical doctor, Dr. Bradd Burner as he dictates.   CONDITION ON DISCHARGE:  Satisfactory.      Thomas A. Cornett, M.D.  Electronically Signed     TAC/MEDQ  D:  08/07/2005  T:  08/07/2005  Job:  [id number]   cc:   Janifer Adie, M.D.  Fax: [phone]

## 2010-07-25 NOTE — Consult Note (Signed)
Jackie Alvarez, GAVIN NO.:  [id number]   MEDICAL RECORD NO.:  [id number]          PATIENT TYPE:  EMS   LOCATION:  ED                           FACILITY:  [facility]   PHYSICIAN:  Jeryl Columbia, M.D.    DATE OF BIRTH:  [birth year]   DATE OF CONSULTATION:  05/10/2006  DATE OF DISCHARGE:                                 CONSULTATION   HISTORY:  The patient is seen at the request of the ER physician, Dr.  Roderic Palau, with upper GI bleeding, melena, hemoglobin of 4 and a PT too  high to measure. She is well known to some of my partners primarily  [place] who assisted with her hospitalization last May. She  has had multiple procedures by both of them in the past. Her last  colonoscopy in May was negative. Dr. Cristina Gong supposedly did an endoscopy  in our outpatient unit and said he wanted to follow her up in 4 months,  but the patient cannot tell me when that was. She has had no problems  with her procedures before. She has had the flu for about 3 [place] she  says with sinus problems, cough and some nausea, vomiting and diarrhea.  She was supposed to go back to Dr. Etheleen Sia to recheck a PT that was  high but unfortunately she was too sick to go back. On Saturday she  threw up some blood but supposedly she would not let her husband bring  her in and finally came in today when she fell and had increased fatigue  and weakness.   PAST MEDICAL HISTORY:  Significant for significant peripheral vascular  disease.  She also has some DVTs, history of breast cancer,  hysterectomy, appendectomy, type 2 diabetes, hypertension. A hip  fracture, left radius fracture and multiple aortic and vascular  surgeries including popliteal bypass. She has also had a hysterectomy.   CURRENT MEDICINES AT HOME:  Actos, captopril, clonidine, Coumadin,  glipizide, HCTZ, labetalol, metformin, Zocor, DynaCirc, methimazole,  amitriptyline, AcipHex and multivitamins.  She does not take any aspirin  or  nonsteroidals.   SOCIAL HISTORY:  Does not drink or smoke.   ALLERGIES:  IODINE.   FAMILY HISTORY:  Negative except for ulcers in both her father and her  brother.   REVIEW OF SYSTEMS:  Negative except above.   PHYSICAL EXAM:  GENERAL:  Lying comfortably on the bed answering all  questions appropriately.  VITAL SIGNS:  Currently stable. Her pulse is decreased from 140-120 with  fluids and transfusion.  LUNGS:  Clear.  HEART:  Regular rate and rhythm.  ABDOMEN:  Soft, nontender.  She has right greater than, I do not feel  any left pulses.  No pedal edema. Both feet are cold.   LABORATORY DATA:  Labs pertinent for white count of 29.5 with a  hemoglobin of 4, MCV 75, platelet count 519.  There is a left shift.  PT  according to the lab too high to measure. BUN of 38, creatinine 1.31.  Liver tests are normal except for an SGOT of 51, albumin of 2.  ASSESSMENT:  1. Multiple medical problems.  Patient on Coumadin I believe for her      vascular insufficiency.  2. Upper GI bleeding probably secondary to over anticoagulation. It      could be a Mallory Weiss tear.   PLAN:  Endoscopy p.r.n. or tomorrow at 12:30 once PT is much further  down or signs of life threatening bleeding but think correcting her PT  is what I would do first as well as transfuse to a safe level.  Will  begin Protonix, allow sips of clear liquids. Would proceed tomorrow. The  risks, benefits, and methods of that were discussed and in the meantime  will try to get her office chart to see when her previous endoscopy was.  Thank you very much for the consult.  Will follow with you and proceed  as above and I have asked to be called with her stat H&H and PT after 4  units of FFP and 4 units of packed cells.           ______________________________  Jeryl Columbia, M.D.     MEM/MEDQ  D:  05/10/2006  T:  05/10/2006  Job:  [id number]   cc:   Janifer Adie, M.D.  Fax: [phone]   [facility]
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
## 2010-07-25 NOTE — Consult Note (Signed)
NAMERUSSIA, CIABURRI                 ACCOUNT NO.:  [id number]   MEDICAL RECORD NO.:  [id number]          PATIENT TYPE:  INP   LOCATION:  [id number]                         [facility]   PHYSICIAN:  Ronald Lobo, M.D.   DATE OF BIRTH:  06-30-1931   DATE OF CONSULTATION:  08/02/2005  DATE OF DISCHARGE:                                   CONSULTATION   Dr. Brantley Stage asked [place] to see this 77 year old female because of an abnormal  radiographic appearance of the abdomen on CT scanning, associated with  significant anorexia and weight loss.   Mrs. Lysne has no past GI history of any significance apart from fairly  chronic heartburn which she has controlled through the years with Rolaids.   For roughly 5 or 6 months, she has had significant anorexia and perhaps a 50  pound unintentional weight loss.   About a month ago, she had the abrupt onset of severe excruciating left-  sided abdominal pain going into her left shoulder, with a recurrence of  similar but less severe pain a couple of [place] later, which is to say a  couple of [place] ago.  She has not had significant abdominal pain at other  times but the anorexia and weight loss have continued.   Because of these symptoms, an abdominal CT was obtained by her primary  physician which showed a fluid collection in the anterior upper abdomen, and  also a small amount of free air in the epigastric region.  This prompted  hospitalization and evaluation by Dr. Brantley Stage.  The patient was clinically  quite stable with no overt sepsis no abdominal tenderness, no fever and no  significant leukocytosis, so a decision was made to defer surgery while  further evaluation was undertaken.   PAST MEDICAL HISTORY:  Allergy to IV CONTRAST DYE.   OUTPATIENT MEDICATIONS:  Actos, Zocor, captopril, clonidine, Coumadin, HCTZ,  labetalol and metformin.   OPERATIONS:  Multiple (approximately 10) peripheral vascular operations on  her lower extremities, also status  post aortobifemoral bypass and a  hysterectomy.   MEDICAL PROBLEMS:  Type 2 diabetes, hypercholesterolemia, hypertension and  extensive peripheral vascular disease.  To my knowledge the patient has not  had any cardiopulmonary problems.   HABITS:  Former smoker, none for the past 10 years or so. Denies alcohol at  present per chart.   FAMILY HISTORY:  Negative for GI illnesses except ulcer disease in both her  father and brother.   SOCIAL HISTORY:  Married, lives at home, former [employer] cabin crew for  [employer]:  No ongoing dysphagia symptoms although possible  intermittent dysphagia suggestive of Schatzki's ring.  No chronic abdominal  pain.  She does have reflux symptoms treated with Rolaids for many years.  No ongoing problems with constipation or diarrhea although if she gets  diarrhea she might see minimal rectal bleeding.  Severe weight loss per HPI.   PHYSICAL EXAM:  Pertinent for heme-positive stool.  A pleasant female in no  evident distress, anicteric.  No frank pallor.  CHEST:  Unremarkable.  HEART:  Normal.  ABDOMEN:  Normal, no bruits.  No organomegaly, no guarding, mass or  tenderness.  Specifically no palpable abscess or tenderness in the  epigastric area where there has been free air in the fluid collection.  RECTAL:  Shows rather firm stool despite a stated history of diarrhea  following her CT scan contrast consumption.  No visible blood is seen but  the stool which is dark brown in color tests 2+ heme-positive at the bedside  guaiac test.  An additional specimen was sent to the lab for there  interpretation.   LABS:  White count on admission 14,200, today it is 12,500, hemoglobin 9.0,  MCV 86, platelets 405,000.  Differential count shows 78 poly's, 14- lymphs,  6 monocytes.  INR currently 1.5, was 1.9 on admission. Chemistry profile  pertinent for albumin of 2.0 on admission with normal liver chemistries,  normal renal  function.  Tumor antigens for CA19-9 and CEA are both negative,  prealbumin is low at 11.1   IMPRESSION:  1.  Evidence for perforation of an intra-abdominal viscous (intra-abdominal      fluid collection and a small amount of free air), possibly chronic or      controlled, since there is no clinical evidence of acute intra-abdominal      sepsis.  2.  Anorexia and 50 pound unintentional weight loss over the past 6 months.  3.  Heme-positive stool by my exam.  4.  Normocytic anemia.  5.  Two episodes of severe episodic abdominal pain over the past month,      possibly corresponding to transient perforation.   DISCUSSION AND RECOMMENDATIONS:  I share Dr. Josetta Huddle concern that this  could be a gastric malignancy, although perhaps chronic peptic disease could  present in a similar fashion.  Careful questioning discloses no evidence of  exposure to ulcerogenic medications, however.   So as to decrease the risk of causing reopening of a perforation, I agree  with Dr. Josetta Huddle plan to start with an upper GI series before we decide on  whether or not endoscopic evaluation is warranted.           ______________________________  Ronald Lobo, M.D.     RB/MEDQ  D:  08/02/2005  T:  08/03/2005  Job:  [id number]   cc:   Janifer Adie, M.D.  Fax: [place]. Cornett, M.D.  [facility] [address]

## 2010-07-25 NOTE — Discharge Summary (Signed)
Jackie Alvarez, Jackie Alvarez                         ACCOUNT NO.:  [id number]   MEDICAL RECORD NO.:  [id number]                   PATIENT TYPE:  INP   LOCATION:  5030                                 FACILITY:  [facility]   PHYSICIAN:  Rennis Harding, M.D.                     DATE OF BIRTH:  1931-12-28   DATE OF ADMISSION:  04/02/2002  DATE OF DISCHARGE:  04/10/2002                                 DISCHARGE SUMMARY   ADMISSION DIAGNOSES:  1. Left distal radius fracture.  2. Left hip intertrochanteric fracture.  3. Diabetes mellitus type 2.  4. Hypertension.  5. Peripheral vascular disease.  6. Reflux.  7. History of deep venous thromboses.  8. Legally blind in right eye.  9. History of breast cancer.   DISCHARGE DIAGNOSES:  1. Status post closed reduction of left distal radius fracture.  2. Status post left hip open reduction and internal fixation with _____.  3. Postoperative hemorrhagic anemia that required transfusion.  4. Hyponatremia improved prior to discharge.  5. Coumadin therapy.  6. Past history of deep venous thromboses.  7. Legally blind in right eye.  8. History of breast cancer.   CONSULTATIONS:  Internal medicine for medical management and preoperative  clearance.   PROCEDURES:  1. Left distal radius closed reduction and casting.  2. Left hip open reduction and internal fixation with ______ which was done     by Rennis Harding, M.D., assistant [facility]  Anesthesia used was     general.   BRIEF HISTORY:  The patient is a 77 year old female with a left hip fracture  and left distal radius fracture following a fall.  She had immediate onset  of pain in the wrist, as well as the hip, and she was transferred to [facility] Emergency Department and found to have fractures of both, and  Dr. Rennis Harding was consulted for orthopedic evaluation and management.  Risks  and benefits of the procedures were discussed with the patient with Dr.  Patrice Paradise, her and her family  indicated understanding and opted to proceed.   LABORATORY DATA:  On 04/02/02 CBC with diff was within normal limits with the  exception of red cells 2.6, hemoglobin 10.5, hematocrit 31.1.  Postoperatively H&H was monitored and did reach a low of 7.5 and 21.5,  respectively, on 04/04/02 she was transfused two units of packed red blood  cells and responded well increasing up to 9.2 and 26.9.  The following day  she remained at that level or above until the time of discharge.  She  remained asymptomatic thereafter and did not require any future blood  transfusions.  Admission PT, INR and PTT were 25.4, 2.8 and 45 respectively.  Postoperatively her Coumadin therapy was resumed and did return to her  therapeutic levels.  Basic complete metabolic panel on admission, sodium was  134, glucose 220, otherwise normal.  Her VMET was monitored each day  postoperatively, the sodium did reach a low of 128 on 04/05/02, following  treatment this increased up to normal at 135 by 04/09/02.  Her potassium was  slightly low on 04/05/02 at 3.4, otherwise each day was within normal limits.  Glucose remains slightly elevated between 167 and 221 each day in her VMET.  Calcium was slightly decreased at 8.2 and 7.8 respectively on 1/27 and  04/05/02.  On 04/04/02, cholesterol was 129, triglycerides were 181, HDL 35,  total cholesterol HDL ratio of 3.7, VLDL 36 and LDL 58.  Blood typing from  04/04/02 showed blood type O, RH positive, antibody screen negative.  EKG  from 04/02/02 showed normal sinus rhythm, no significant change by Dr. Roe Rutherford.  April 03, 2002, normal sinus rhythm read by Dr. Eden Lathe. Ganji.   X-rays from 04/02/02:  Portable chest showed minimal right bibasilar  atelectasis; 04/03/02 was utilized intraoperatively; 04/02/02 chest shows no  active cardiopulmonary disease; left wrist on 04/02/02 showed Colles fracture  of the distal radial metaphysis; pelvis and left hip show fracture of the  left hip and a  trochanteric fracture.   HOSPITAL COURSE:  Following admission internal medicine consult was obtained  for preoperative clearance as well as medical management throughout her  hospital stay, secondary to her numerous medical diagnoses.  Risks and  benefits of the procedure were discussed with the patient and the patient's  family.  They indicated understanding and opted to proceed.  Following  medical clearance, she was taken to the operating room on 04/03/02 for the  above noted procedures.  She tolerated the procedures very well without any  intraoperative complications  She was transferred to the recovery room in  stable condition.  Postoperatively appropriate antibiotic course was ordered  and delivered without difficulty.  Pain control was utilized with a  combination of PCA as well as p.o. analgesics and remained adequate.  She  was transitioned over to p.o. analgesics by postoperative day two and seemed  to tolerate adequately.  Neurovascular checks were started upon admission to  the hospital and remained intact without any problems or complications  throughout her hospital stay.  Her Coumadin was resumed postoperatively as  she was on when coming into the hospital and remained therapeutic throughout  her stay.  The medical team dealt with all of her medical diagnoses and  issues during the hospital stay.  She did develop some hyponatremia reaching  a low of 128, following treatment this did return to normal prior to  discharge.  On postoperative day two operative dressing was taken down,  revealed good looking incision without any signs or symptoms of infection,  daily dressing changes were done thereafter.  The incision continued to look  good without any signs or symptoms of infection.  On 04/04/02, the patient's  hemoglobin was 7.5 and she was transfused two units of packed red blood cells, she tolerated this well, hemoglobin responded nicely as previously  dictated, she remained  asymptomatic thereafter and did not require any  further transfusions.  Physical therapy and occupational therapy were  consulted for progressive ambulation as well as gait training and safety  issues.  Partial weightbearing to the left lower extremity to 25% and  nonweightbearing strictly to the left upper extremity,  however she was  allowed to bear weight through her elbow with a platform walker.  Rehab  consult was ordered secondary to the patient's limited assistance at home  and  significant limitation considering she has both a left upper as well as  lower extremity limitation.  They thought she would be a good candidate for  them, as a bed became available.  She did progress slowly with physical  therapy.  By 04/07/02, the patient was medically stable and ready for  discharge to [facility], however when a bed is available she will  continue as an inpatient working with therapy, a bed was not arranged in  rehab until 04/10/02, at which time she was transferred there.  She was doing  very well.  She had met all orthopedic goals.  She was medically stable and  ready for discharge to rehab.  She was neurovascularly intact, her incisions  looked good without any signs or symptoms of infection, swelling in the left  hand had improved.   DISCHARGE PLAN:  The patient is a 77 year old female status post left hip  ORIF as well as left distal radius closed reduction and casting, doing well.  Activity is partial weightbearing 25% to the left lower extremity,  nonweightbearing to the left upper extremity.  She may bear weight through  her elbow on the left upper extremity in a platform walker.  Dressing  changes daily to the left hip.  She may shower when there is no drainage  from the left hip.  She should keep the left upper extremity cast dry and  intact.  Diet is regular home diet as tolerated.    CONDITION ON DISCHARGE:  Stable and improved.   DISPOSITION:  The patient is being  discharged to [facility] on  04/10/02.     Karenann Cai, P.A.                       Rennis Harding, M.D.    CM/MEDQ  D:  05/18/2002  T:  05/19/2002  Job:  [id number]

## 2010-07-25 NOTE — H&P (Signed)
NAMEKRISHONDA, LOEFFEL                 ACCOUNT NO.:  [id number]   MEDICAL RECORD NO.:  [id number]          PATIENT TYPE:  EMS   LOCATION:  ED                           FACILITY:  [facility]   PHYSICIAN:  Carey Bullocks. Gertie Exon, MD     DATE OF BIRTH:  [birth year]   DATE OF ADMISSION:  05/10/2006  DATE OF DISCHARGE:                              HISTORY & PHYSICAL   CHIEF COMPLAINT:  Gastrointestinal bleeding and weakness.   HISTORY OF PRESENT ILLNESS:  Ms. Sadek is a 77 year old female with  multiple medical problems including diabetes mellitus and peripheral  veno-occlusive disease.  She was well until approximately 2 [place] ago,  when she developed some cough and runny nose.  This resolved after a few  days but in the intervening two [place] she began feeling lousy.  She was  not able to describe the symptoms any more specifically.  Approximately  one week ago she began to have diarrhea and vomiting, and her husband  noticed that she had blood in both her vomit and her stool 3-4 days ago.  She had two significant falls during these last few days, one on  Saturday, May 08, 2006 and one earlier today.  She describes these as  truly passing out because she has no memory of the events.  She did  injure her face during one of these falls.   PAST MEDICAL HISTORY:  1. Diabetes.  2. Hypertension.  3. Peripheral veno-occlusive disease, status post numerous surgeries.  4. She has had a recent hospital admission for a workup of free air      seen on a CT scan of the abdomen.  No specific diagnosis was found.  5. She has also had a hip fracture treated surgically in 2004.   The patient was quite weak at the time of the history and unable to  provide further details.   MEDICATIONS:  1. Actos, dose unknown.  2. Coumadin.  3. Captopril 75 mg twice daily.  4. Clonidine 0.3 mg before bed.  5. Glipizide 2.5 mg twice daily.  6. Hydrochlorothiazide 25 mg daily.  7. Labetalol 300 mg twice daily.  8. Metformin ER  1,000 mg before bed.  9. Zocor 40 mg daily.  10.DynaCirc CR 5 mg daily.  11.Methimazole 10 mg twice daily.  12.Amitriptyline 25 mg before bed.  13.Aciphex 30 mg daily.  14.Multivitamin one daily.   ALLERGIES:  IODINE.   PHYSICAL EXAMINATION:  VITAL SIGNS:  Temperature 98, heart rate 146,  blood pressure 150/78, respiratory rate 20, oxygen saturation 100% on  room air.  GENERAL APPEARANCE:  The patient was extremely pale and weak.  She had  difficulty providing any history.  HEENT:  Mucous membranes appeared slightly moist.  She had a very large  notable bruise over her left orbit including a scleral hemorrhage in the  left eye.  LUNGS:  Clear to auscultation bilaterally.  CARDIOVASCULAR:  Tachycardic.  Hyperdynamic with a flow murmur.  ABDOMEN:  Obese.  Normal bowel sounds.  Soft, nontender except on deep  palpation.  EXTREMITIES:  Were cool.  Pulses  were palpable.   LABORATORY STUDIES:  White blood cell count 29.5, hemoglobin 4,  hematocrit 12.7, platelets 519, neutrophils 87%, lymphocytes 7%.  Electrolytes:  Sodium 130, potassium 4.2, chloride 95, bicarb 21, BUN  38, creatinine 1.3, glucose 411.  Serum albumin 2.  Total protein 4.4.  The patient had a CT scan of the head and neck.  The results were not  available at the time of this dictation.  A chest x-ray was also  performed and the results were not available.   ASSESSMENT/PLAN:  1. Upper gastrointestinal bleed.  The patient has been evaluated by      gastroenterology.  In addition to replacing her clotting factors      and blood, she will be placed on Protonix 40 mg twice daily, kept      nothing by mouth for four hours an endoscopy which will happen at      approximately noon tomorrow.  2. Severe hemorrhagic anemia and coagulopathy.  The patient will be      given 4 units of fresh frozen plasma and transfused 4 units of      packed red blood cells.  She will also receive Lasix to prevent      volume overload.  3.  Hyponatremia.  This is mild.  I suspect this will resolve with      replacement of her red blood cells and she will have lower cardiac      output.  4. Tachycardia.  This should resolve when her anemia has been      corrected.  5. Leukocytosis and thrombocytosis.  Because the patient has been      bleeding for several days, I think this is a leukemoid reaction to      severe anemia.  There is no evidence that she has an acute      infection.  She will not be investigated for this.  I will repeat      her CBC tomorrow and if the white count has not corrected with her      anemia, then I will consider an evaluation for causes of infection.  6. Diabetes mellitus.  The patient will receive her routine home      medications as well as insulin coverage.  7. Hypertension.  I will continue the patient's regular medications.      It will be important to keep her blood pressure controlled in the      setting of an acute bleeding event.  8. Hypercholesterolemia.  The patient will continue to receive Zocor.  9. Peripheral veno-occlusive disease.  The patient's Coumadin will be      held.      Carey Bullocks. Gertie Exon, MD  Electronically Signed     PML/MEDQ  D:  05/10/2006  T:  05/10/2006  Job:  [id number]   cc:   Janifer Adie, M.D.  Fax: [phone]

## 2010-07-25 NOTE — Discharge Summary (Signed)
Jackie Alvarez, Jackie Alvarez                 ACCOUNT NO.:  [id number]   MEDICAL RECORD NO.:  [id number]          PATIENT TYPE:  INP   LOCATION:  1422                         FACILITY:  [facility]:  Carey Bullocks. Gertie Exon, MD     DATE OF BIRTH:  [birth year]   DATE OF ADMISSION:  05/10/2006  DATE OF DISCHARGE:  05/17/2006                               DISCHARGE SUMMARY   DISCHARGE DIAGNOSES:  1. Upper gastrointestinal bleeding.  2. Hemorrhagic anemia.  3. Supraventricular tachycardia.   HOSPITAL COURSE:  Jackie Alvarez is a 77 year old female with multiple  medical problems including peripheral venous occlusive disease and  atrial fibrillation for which she receives chronic Coumadin.  She was  well until approximately 2 [place] before admission when she developed  upper respiratory infection type symptoms.  1 week before admission, she  began to have diarrhea and vomiting and 3-4 days before admission, her  husband noticed that she was having blood in both her vomit and stool.  During the next few days she had two significant falls in which she  injured her side and her face.  She was ultimately brought to the  emergency department on March 3, where she was found to have a markedly  elevated INR of 7.4, a white blood cell count of 29.5, hemoglobin of 4,  and hematocrit of 12.9.  She received multiple units of packed red blood  cells and fresh frozen plasma.  Gastroenterology was consulted and the  patient was taken for endoscopy.  Endoscopy revealed a diffuse  nonspecific gastritis with some erosions and ulcers seen.  There was no  active single source of bleeding, however.   The patient remained in the intensive care unit for a few days.  During  this time, her course was complicated only by tachycardia.  The patient  had a supraventricular tachycardia with a rate of 140 for approximately  2 days.  This finally was corrected when she received a dose of digoxin,  some intravenous beta blockers and  electrolyte replacement.  Her rhythm  converted to atrial flutter with a rate in the 110's and low 100's.  She  then ultimately reverted to atrial fibrillation with a low rate between  50 and 100.  She was asymptomatic from her arrhythmia through this  entire time.  She was transferred ultimately to a regular medical floor  where she remained stable.   The patient did have one acute drop in her hematocrit, but this was not  associated with any clinical bleeding events.  This was treated with a  transfusion and her hemoglobin was 10.3 at the time of discharge.  Gastroenterology remained involved with her throughout this time and did  not feel that this represented a significant bleeding event.   ASSESSMENT:  1. Upper gastrointestinal bleed due to gastritis.  The patient will go      home on twice daily proton pump inhibitor therapy as well as      Carafate.  She has been given prescriptions for these.  She will      follow up with Herbie Baltimore  Buccini, M.D. as instructed.  2. Atrial arrhythmias and atrial fibrillation.  The patient has been      restarted on her Coumadin.  She should follow up with Janifer Adie, M.D. later this week to have her INR rechecked.      Carey Bullocks. Gertie Exon, MD  Electronically Signed     PML/MEDQ  D:  05/17/2006  T:  05/17/2006  Job:  [id number]   cc:   Ronald Lobo, M.D.  Fax: [phone]   Janifer Adie, M.D.  Fax: [phone]

## 2010-07-25 NOTE — Discharge Summary (Signed)
[facility]  Patient:    Jackie Alvarez, Jackie Alvarez [facility]                      MRN: [id number] Adm. Date:  [id number] Disc. Date: [id number] Attending:  Derry Lory Dictator:   Earnstine Regal, P.A. CC:         Janifer Adie, M.D.   Discharge Summary  DATE OF BIRTH:  April 12, 1931.  ADMISSION DIAGNOSES: 1. Ischemic left foot secondary to failing left femoral profunda to peroneal    bypass graft with a proximal stenosis of distal common femoral and profunda    femoris arteries and severe distal anastomosis of the vein graft to    peroneal artery. 2. Status post multiple revascularization procedures. 3. Adult onset diabetes mellitus, non-insulin-dependent. 4. Hypertension. 5. History of breast cancer. 6. Aortoiliac occlusive disease. 7. Hypercholesterolemia. 8. Chronic obstructive pulmonary disease with a long history of tobacco use.  DISCHARGE DIAGNOSES: 1. Ischemic left foot secondary to failing left femoral profunda to peroneal    bypass graft with a proximal stenosis of distal common femoral and profunda    femoris arteries and severe distal anastomosis of the vein graft to    peroneal artery. 2. Status post multiple revascularization procedures. 3. Adult onset diabetes mellitus, non-insulin-dependent. 4. Hypertension. 5. History of breast cancer. 6. Aortoiliac occlusive disease. 7. Hypercholesterolemia. 8. Chronic obstructive pulmonary disease with a long history of tobacco use.  PROCEDURES:  Dacron patch angioplasty of the left common femoral and femoral profunda, extension of the left femoral peroneal bypass graft to a more distal site using lesser saphenous vein from the right leg and intraoperative arteriogram, November 17, 1999, Dr. Kellie Simmering.  BRIEF HISTORY:  The patient is a 77 year old white female who had undergone multiple revascularization procedures, however, over the past few years she has been having increasing pain in the left  foot and continues to have incomplete healing of the first toe.  She has been known to have mild narrowing in the origin of the femoral peroneal bypass graft with elevated velocities, but evaluation in vascular lab on November 11, 1999, showed there is significant change in the ankle brachial index dropping from 0.74 to 0.47 with evidence of high velocity at the lower end of the bypass.  It was Dr. Mardi Mainland opinion that she has once again developed an anastomotic stenosis where the graft is hooked into the peroneal artery.  She needed arteriograms to confirm this.  Her Coumadin was held, and she was scheduled for angiogram on Friday, September 7 with plans for revision on Monday, September 10 at [facility].  For further History and Physical please see the dictated note.  PAST MEDICAL HISTORY:  Include peripheral vascular occlusive disease with multiple interventions, adult onset diabetes mellitus, non-insulin-dependent, hypertension, history of breast cancer, aortoiliac occlusive disease, hypercholesterolemia, mild COPD.  SOCIAL HISTORY:  History of one and one-half packs per day x 45 years cigarette use.  MEDICATIONS ON ADMISSION: 1. Captopril 50 mg one b.i.d. 2. Clonidine 0.3 mg one h.s. 3. Labetalol 300 mg b.i.d. 4. Zocor 20 mg one h.s. 5. Hydrochlorothiazide 25 mg q.d. 6. Actos 15 mg p.o. q.d. 7. Glucotrol XL 10 mg p.o. q.d. 8. Coumadin 2 mg one Monday, Wednesday, Friday and one-half tablet Tuesday,    Thursday, Saturday, and Sunday.  ALLERGIES: ARTERIOGRAM DYE causes chills, nausea, and vomiting.  She was laced on a prednisone dosepak prior to admission for arteriography.  HOSPITAL COURSE:  The patient  was admitted, taken to the operating room, and underwent left Dacron patch angioplasty of the common and femoral profunda arteries, extension of the distal femoral peroneal vein graft to a more distal site of the peroneal using lesser saphenous vein of the right  leg, and intraoperative arteriogram, November 17, 1999, Dr. Kellie Simmering.  The patient tolerated the procedure well and postoperatively had good Doppler flow.  First postoperative morning hematocrit was 25, potassium was 4.1, creatinine was 1.0, pro time was 17.6 seconds with an INR of 1.8 and a PTT of 35 seconds. She looked good.  She had Doppler flow in her DP and peroneal, and Dr. Kellie Simmering was quite pleased with the results.  Unfortunately, Dopplers showed and index of 0.63 on the right and 0.37 on the left which was below the preoperative studies.  These were repeated the following day as Dr. Kellie Simmering did not believe them.  Repeat ABIs were 1.0 on the right and 0.8 on the left.  The patient has been slowly mobilized.  Her heparin was discontinued on November 20, 1999. At this point her pro time is 20.7 seconds with an INR of 2.4.  She is mildly hypokalemic with this being replaced.  Her hemoglobin is 8.9 with hematocrit of 25.3, and her platelets are 168,000.  Heparin was discontinued, she has been mobilized, and it was Dr. Antonietta Barcelona opinion if she continued to do well she could go home in the a.m., November 21, 1999.  DISCHARGE MEDICATIONS:  1. Captopril 50 mg one b.i.d.  2. Clonidine 0.3 mg one h.s.  3. Labetalol 300 mg b.i.d.  4. Zocor 20 mg one h.s.  5. Hydrochlorothiazide 25 mg q.d.  6. Actos 15 mg p.o. q.d.  7. Glucotrol XL 10 mg p.o. q.d.  8. Coumadin 2 mg one Monday, Wednesday, Friday and one-half tablet Tuesday,     Thursday, Saturday, and Sunday.  9. Tylox one to two p.o. q.4h. p.r.n. for pain. 10. A multivitamin with iron one q.d.  FOLLOWUP:  She will return next week for staple removal and see Dr. Kellie Simmering in two [place], and our office will call and make those appointments for the patient.  She will return to see Dr. Bradd Burner next week to have her Coumadin checked.  DISCHARGE INSTRUCTIONS:  Activity:  Light to moderate, no lifting over 10 pounds, no driving, no strenuous  activity.  CONDITION ON DISCHARGE:  Improving.  DD:  11/20/99 TD:  11/23/99 Job: [id number]

## 2010-07-25 NOTE — Consult Note (Signed)
NAMECARDIN, MILTIMORE                 ACCOUNT NO.:  [id number]   MEDICAL RECORD NO.:  [id number]          PATIENT TYPE:  INP   LOCATION:  [id number]                         FACILITY:  [facility]   PHYSICIAN:  Cyril Mourning, D.O.    DATE OF BIRTH:  01/10/1932   DATE OF CONSULTATION:  07/31/2005  DATE OF DISCHARGE:                                   CONSULTATION   PRIMARY CARE PHYSICIAN:  Sadie Haber, Dr. Jake Bathe. They have been notified and will  follow her subsequently to this initial consultation.   CONSULTATIONS:  Dr. Brantley Stage of [facility].   REASON FOR CONSULTATION:  Hypertension management.   HISTORY OF PRESENT ILLNESS:  Ms. Liberato is a pleasant 77 year old Caucasian  female with extensive history of peripheral vascular disease, who has for  several Garin, been following with Dr. Jake Michaelis for failure to thrive, feeling  un-well, poor appetite and weight loss. Dr. Bradd Burner ordered a CT scan and  discovered that she has some free extraluminal fluid and air. She is  subsequently admitted for exploratory laparotomy. Remarkable, she is mostly  asymptomatic without acute abdomen on examination and normotensive and  hemodynamically stable and ambulatory. Dr. Brantley Stage has initiated her on  Zosyn empirically and she is NPO for her procedure.   PAST MEDICAL HISTORY:  Significant for left hip fracture. She has had a left  radius fracture. She has type 2 diabetes, hypertension, peripheral vascular  disease with multiple, she states over 10 lower extremity procedures. She  states that she has had some aortic surgery as well. She cannot expand on  the nature of this. She has had a femoral popliteal bypass. She has had a  history of a DVT in the past, history of breast cancer, hysterectomy, and  appendectomy.   MEDICATIONS:  She uses [facility] on [employer]. She does not know the  dosages of her medications but are as follows:  Actos, Captopril, Clonidine,  Coumadin, DynaCirc, Glipizide, hydrochlorothiazide,  Labetalol, metformin,  and Zocor.   Her inpatient medications are as follow:  Capoten, Catapres, HCTZ,  Labetalol, and Zosyn. She is being held NPO at this time.   ALLERGIES:  CONTRAST, IODINE.   SOCIAL HISTORY:  She is married. She has one 54 year old son. She formerly  worked as the Consulting civil engineer. She denies alcohol. Does have a distant  tobacco history in the past. No longer is smoking.   FAMILY HISTORY:  Mother died at age 20 with a gastric cancer. Father is 78  with liver cancer.   REVIEW OF SYSTEMS:  Failure to thrive at home. Poor appetite. Some loose  stools. She has had some nausea but no vomiting. Otherwise, no chest pain or  shortness of breath.   PHYSICAL EXAMINATION:  VITAL SIGNS:  Blood pressure 194/84. Respiratory rate  18. Heart rate 98. Temperature 100.3. O2 saturation on room air 100%.  GENERAL:  The patient is alert and oriented. No acute distress. She is  ambulatory, walking around in the room.  HEENT:  Normocephalic and atraumatic. Extraocular muscles intact.  NECK:  Supple. Nontender. No palpable organomegaly or mass.  CARDIOVASCULAR:  Normal S1 and S2. There is a soft mid systolic crescendo to  crescendo murmur along the right sternal border without radiation to the  carotids. She does not exhibit pulses parvus or pulses tardus.  LUNGS: Clear to auscultation. She exhibits normal effort and there is no  dullness to percussion.  ABDOMEN:  Soft. There is an audible abdominal bruit. There is no rebound or  rigidity. She has normal to hyper-active bowel sounds.  EXTREMITIES:  No edema.  NEUROLOGIC:  No focal deficits.   LABORATORY DATA:  Her CT scan revealed some extraluminal free air fluid with  suspicion for perforated gastric ulcer.   Her chemistry revealed a sodium of 124, potassium 3.3, BUN 22, creatinine  1.2, glucose 101, AST and ALT 29/27. Albumin 2.2, calcium 8.3. WBC is 20.  Hemoglobin 8.9. Platelet count 479,000. MCV of 86.   ASSESSMENT:  1.   Questionable perforated ulcer/viscous. For OR exploration per Dr.      Brantley Stage.  2.  Poor controlled hypertension.  3.  Protein and calorie malnutrition.  4.  Peripheral vascular disease.  5.  Anemia, normocytic.  6.  Diabetes mellitus.  7.  Hypernatremia and hypokalemia, perhaps related to hydrochlorothiazide      diuretic.  8.  Abdominal bruit.   PLAN:  At this time, in reference to assisting in the management of Ms.  Glatfelter' hypertension, we will initiate while she is currently NPO, IV  Lopressor and transdermal Clonidine for now. Follow her clinical course.  Will replete her electrolytes including her potassium. Administer IV fluids  and type and cross 2 units of packed red blood cells for OR tomorrow. Her  hemoglobin is 8.9. Will follow this closely and transfuse if she does drop.  She is currently empirically on Zosyn per Dr. Brantley Stage. Would recommend  sequential compression devices for DVT prophylaxis and  low molecular weight heparin, as soon as she is cleared for this peri-  operatively. Would continue IV beta blocker as her rate and pressure  tolerates. Follow her clinical course. [facility] Hospitalist will follow  subsequent to this initial consultation. I have spoken to Dr. Doree Barthel, as she is a patient of Dr. Bradd Burner.      Cyril Mourning, D.O.  Electronically Signed     ESS/MEDQ  D:  07/31/2005  T:  08/02/2005  Job:  [id number]   cc:   Thomas A. Cornett, M.D.  [address]  [place] [address]   Janifer Adie, M.D.  Fax: [place]. Conley Canal, MD

## 2010-07-25 NOTE — Procedures (Signed)
[facility]  Patient:    Jackie Alvarez, Jackie Alvarez                          MRN: [id number] Proc. Date: 11/14/99 Attending:  Nelda Severe. Kellie Simmering, M.D. CC:         [facility]   Procedure Report  PREOPERATIVE DIAGNOSIS:  Failing left femoral-to-peroneal bypass graft secondary to proximal and distal stenoses.  PROCEDURE:  Bilateral iliac angiography via right common femoral approach and selective catheterization of left limb of aortobifemoral bypass graft with bilateral lower extremity angiographies.  SURGEON:  Nelda Severe. Kellie Simmering, M.D.  ANESTHESIA:  Local Xylocaine and Versed 2 mg intravenously.  DESCRIPTION OF PROCEDURE:  Patient was taken to the [facility] and placed in the supine position, at which time the right groin was prepped with Betadine solution and draped in a routine sterile manner.  After infiltration with 1% Xylocaine, the right limb of the aortobifemoral graft was entered percutaneously and the guidewire passed proximally without difficulty.  There was difficulty entering the guidewire with the 5-French sheath and dilator because of buckling at the entrance area and because of the scar tissue; therefore, it was dilated with a 7-French dilator and an end-hole catheter was maneuvered over the guidewire into the right limb of the graft and the guidewire exchanged for an Amplatz super-stiff wire.  A ______ catheter was then passed over the Amplatz wire and reformed in the distal portion of the aortobifemoral graft and the left limb of the aortobifemoral graft was cannulated.  A left lower extremity angiogram was then performed through the catheter, injecting 40 cc of contrast at 8 cc/sec. This revealed the left limb to be widely patent and anastomosed to the common femoral artery.  There was a 50% stenosis distal to the anastomosis in the origin of the profunda and then a false aneurysm originating medially from this  vessel, which probably represents the cul-de-sac of an old femoropopliteal Gore-Tex graft.  Distal to this, the profunda was widely patent and the vein graft originated about six inches distally and was a small but widely patent vein graft that was anastomosed to the proximal peroneal artery.  There was a distal anastomotic stenosis approximating 95% and one-vessel runoff primarily through the peroneal artery, also filling a diseased posterior tibial artery.  The original film was using LAO projection. This was repeated with the RAO projection, confirming the above findings. The ______ catheter was then removed over the guidewire, the guidewire removed and the right lower extremity angiogram performed through the sheath, injecting 40 cc of contrast at 8 cc/sec.  This revealed the right limb of the graft to be widely patent and anastomosed to the common femoral artery.  The right profunda femoris artery was widely patent.  The superficial femoral artery was patent but did have some mild-to-moderate disease, with some irregularity in the distal thigh with about an approximate 70% but was patent across the knee joint and there was three-vessel runoff through mildly diseased tibial vessels on the right.  Having tolerated the procedure well, the sheath was removed, adequate compression applied and the patient taken to outpatient medical in stable condition. DD:  11/14/99 TD:  11/14/99 Job: [id number]

## 2010-07-25 NOTE — Op Note (Signed)
Jackie Alvarez, Jackie Alvarez                 ACCOUNT NO.:  [id number]   MEDICAL RECORD NO.:  [id number]          PATIENT TYPE:  AMB   LOCATION:  [facility]                         FACILITY:  [facility]   PHYSICIAN:  Thomas A. Cornett, M.D.DATE OF BIRTH:  07-Dec-1931   DATE OF PROCEDURE:  11/11/2005  DATE OF DISCHARGE:                                 OPERATIVE REPORT   PREOPERATIVE DIAGNOSIS:  A 2 x 2 cm sacral decubitus.   POSTOPERATIVE DIAGNOSIS:  A 2 x 2 cm sacral decubitus.   PROCEDURE:  Full-thickness debridement of sacral decubitus with closure.   SURGEON:  Marcello Moores A. Cornett, M.D.   ANESTHESIA:  MAC with 0.25% Sensorcaine local.   ESTIMATED BLOOD LOSS:  20 mL.   DRAINS:  None.   INDICATIONS FOR PROCEDURE:  The patient is a 77 year old female with a  nonhealing small sacral decubitus.  She is here today for debridement and  closure of this to promote better wound healing.  The procedure was  discussed with the patient.  She understands and agrees to proceed.   DESCRIPTION OF PROCEDURE:  The patient brought to the operating suite and  placed prone.  The area over her sacrum and coccyx was prepped and draped in  a sterile fashion.  There was a small open to 2 x 2 cm wound.  After  infiltration of local anesthesia, I excised the skin and subcu fat down to  the muscle and to the coccyx.  This was excised back for about a centimeter  to clear clean, healthy tissue.  The was closed in 2 layers with 3-0 Vicryl  and 4-0 Monocryl.  Steri-Strips and dry dressings were applied.  All final  counts of sponge, needle and instruments were found to be correct.  The  patient was taken to recovery in satisfactory condition.      Thomas A. Cornett, M.D.  Electronically Signed     TAC/MEDQ  D:  11/11/2005  T:  11/11/2005  Job:  [id number]   cc:   Janifer Adie, M.D.  Fax: [phone]

## 2010-07-25 NOTE — H&P (Signed)
[facility]  Patient:    Jackie Alvarez, Jackie Alvarez                          MRN: [id number] Adm. Date:  11/14/99 Attending:  Nelda Severe. Kellie Simmering, M.D. Dictator:   Sharene Butters, P.A.                         History and Physical  DATE OF BIRTH:  May 10, 1931  CHIEF COMPLAINT:  Left peripheral vascular occlusive disease.  HISTORY OF PRESENT ILLNESS:  Jackie Alvarez is a pleasant 77 year old white female with significant history of PVOD with multiple lower extremity revascularizations, the last on May 19, 1999, with status post revision of the left femoral/popliteal/to peroneal bypass with an angioplasty of the distal aspect of the vein graft and peroneal artery by Dr. Nelda Severe. Kellie Simmering.  Since surgery, the patient has been followed up at the office.  On September 30, 1999, the patient was seen at [facility]. Dopplers from previous visits have shown good flow and good perfusion.  No abnormalities were seen; however, the patient returned on November 11, 1999, with the complaint of increasing pain in the left foot, and incomplete healing of the first toe since surgery.  She has been known to have mild narrowing at the origin of the left femoral/peroneal bypass graft, with increasing velocities, but todays Dopplers revealed decrease of the left ABIs from 74% to 47%, with evidence of increased velocities at the lower end of the bypass, which is suspicious of an anastomotic stenosis where the graft is hooked to the peroneal artery.  Dr. Kellie Simmering recommended a revision, which is scheduled on November 17, 1999, in order to achieve limb salvage.  Prior to that, the patient will undergo an angiogram scheduled for November 14, 1999.  The patient denies any buttocks pain or hip pain.  The pain is concentrated in the leg, and the foot.  This pain is present at rest and at night.  There is significant edema.  No gangrenous changes, but there is  significant erythema in the area.  She denies  any shortness of breath, but she oes have dyspnea on exertion after walking, secondary to this pain.  No paroxysmal nocturnal dyspnea or orthopnea.  No chest pain or palpitations.  PAST MEDICAL HISTORY: 1. PVOD with multiple interventions, severe. 2. AODM, non-insulin-dependent. 3. Hypertension. 4. History of breast cancer. 5. AIOD. 6. History of hemorrhoids. 7. Hypercholesterolemia.  PAST SURGICAL HISTORY: 1. Status post multiple revascularizations, last procedure in March 2001,    by Dr. Kellie Simmering, as described above. 2. Status post total hysterectomy in 1980.  CURRENT MEDICATIONS: 1. Coumadin 2 mg on Tuesday through Friday, 1 mg on Monday.  The patient    stopped on November 11, 1999. 2. Norvasc 5 mg q.d. 3. Captopril 50 mg p.o. q.d. 4. Clonidine 0.3 mg p.o. q.d. 5. HCTZ 25 mg p.o. q.d. 6. Glucotrol XL 10 mg p.o. q.d. 7. Actos 50 mg p.o. q.d. 8. Pravachol 20 mg p.o. q.h.s.  ALLERGIES:  No known drug allergies.  REVIEW OF SYSTEMS:  See the HPI and past medical history for significant positives. The patient denies any history of PE or DVT.  No dysuria or hematuria.  No symptoms of CVA, TIA, or amaurosis fugax.  FAMILY HISTORY:  Mother died of stomach cancer at age 37.  Father died at age 54 of liver cancer.  SOCIAL HISTORY:  Married, with one  child.  She is on disability.  She used to be a Consulting civil engineer.  She quit a 1-1/2 pack-per-day of cigarettes for 45 years, about three months ago.  She denies any alcohol intake.  PHYSICAL EXAMINATION:  GENERAL:  A well-developed, well-nourished 77 year old white female, in no acut4e distress, alert and oriented x 3.  VITAL SIGNS:  Blood pressure 140/62 on the left, pulse 68, respirations 20.  HEENT:  Head normocephalic, atraumatic.  PERRLA.  EOMI.  Funduscopic examination within normal limits.  NECK:  Supple, no jugular venous distention, no bruits, thyromegaly, or lymphadenopathy.  CHEST:  Symmetrical on  inspiration with no wheezes, rhonchi, or rales. Respirations are unlabored.  NODES:  No axillary or supraclavicular lymphadenopathy.  CARDIOVASCULAR:  A regular rate and rhythm with a 1/6 systolic murmur, with no ubs or gallops.  ABDOMEN:  Soft, nontender.  Bowel sounds x 4.  No hepatosplenomegaly or masses palpable.  There is a very soft periumbilical bruit present.  GENITOURINARY:  Deferred.  RECTAL:  Deferred.  EXTREMITIES:  No clubbing.  There is a significant erythema in the left lower extremity, which extends up to the tibia, accompanied by peripheral edema, and his area is tender to palpation.  The skin reveals no ulcerations or gangrenous changes visible.  There is a mild decrease in temperature to touch.  The second toe in he left lower extremity is amputated, and there are no visible areas of drainage or openings.  PERIPHERAL PULSES:  Carotids 2+ bilaterally with no bruits.  Femorals 2+ bilaterally with a bruit on the left.  Popliteal, dorsalis pedis, and posterior  tibialis nonpalpable on the left.  On the right are 1+.  NEUROLOGIC:  Nonfocal.  The patient takes slow steps secondary to the pain. Deep tendon reflexes 2+ bilaterally.  Muscle strength 5/5.  ASSESSMENT/PLAN:  A 77 year old white female with a significant history of peripheral vascular occlusive disease, with multiple revascularizations, who will undergo an angiogram on November 14, 1999, followed by a revision on September 0, 2001, of the left femoral/popliteal bypass graft with saphenous vein graft from the right leg, by Dr. Kellie Simmering.  Dr. Kellie Simmering has seen and evaluated this patient prior to the admission and has explained the risks and benefits involving the procedure, and the patient has agreed to continue. DD:  11/13/99 TD:  11/13/99 Job: [id number]

## 2010-07-25 NOTE — Op Note (Signed)
Jackie Alvarez, Jackie Alvarez                 ACCOUNT NO.:  [id number]   MEDICAL RECORD NO.:  [id number]          PATIENT TYPE:  INP   LOCATION:  1225                         FACILITY:  [facility]   PHYSICIAN:  Jeryl Columbia, M.D.    DATE OF BIRTH:  11-03-1931   DATE OF PROCEDURE:  05/11/2006  DATE OF DISCHARGE:                               OPERATIVE REPORT   PROCEDURE:  EGD with biopsy.   INDICATION:  Upper GI bleeding.  Consent was signed after risks,  benefits, methods, options thoroughly discussed multiple times with the  patient and her family including yesterday in the ER.   MEDICINES USED:  Fentanyl 50 mcg, Versed 4 mg.   PROCEDURE:  The video endoscope was inserted by direct vision.  Esophagus was normal.  She did have a small hiatal hernia.  Scope passed  in the stomach, a little bit of old food was seen proximally.  Stomach  was pertinent for significant linear ulcers, erosions and gastritis,  moderately impressive, almost looked ischemia or like possible Crohn's.  Supposedly she is not on any aspirin or nonsteroidals.  Scope was  inserted through a normal pylorus, into a normal duodenal bulb and  around the C-loop to a normal second portion of the duodenum.  No blood  was seen distally.  Scope was withdrawn back to the bulb and a good look  there ruled out abnormalities in that location.  Scope was withdrawn  back to the stomach which was evaluated on retroflex and straight  visualization.  The small amount of food in the fundus could not be  washed or suctioned.  Good visualization was obtained of the rest of the  stomach which confirmed above findings.  A few biopsies of the proximal  and distal stomach of areas of erosions and ulcerations were obtained.  There was no significant bleeding, despite her PT still being up.  After  a few biopsies, we elected not to biopsy any further.  Air was suctioned  and scope was slowly withdrawn.  Again a good look at the esophagus was  normal.   Scope was removed.  The patient tolerated the procedure well.  There was no obvious immediate complication.   ENDOSCOPIC DIAGNOSES:  1. Small hiatal hernia.  2. Multiple gastric erosions, tiny and linear ulcers, status post few      biopsies with also gastritis.  3. Otherwise normal esophagogastroduodenoscopy without any active      bleeding being seen.   PLAN:  Await pathology.  Appearance looks almost like Crohn's or  ischemia.  Increase pump inhibitors to b.i.d.  Consider adding Carafate  or even Cytotec as an outpatient.  Reevaluate Coumadin needs.  Dr. Penelope Coop  to follow in the morning but will allow clear liquids and decrease  frequency of blood drawn since no active bleeding.           ______________________________  Jeryl Columbia, M.D.     MEM/MEDQ  D:  05/11/2006  T:  05/11/2006  Job:  [id number]   cc:   Ronald Lobo, M.D.  Fax: [phone]   Herbie Baltimore  Carlynn Spry, M.D.  Fax: [phone]   [facility]

## 2010-07-25 NOTE — Op Note (Signed)
NAMELAINEE, WISHON                 ACCOUNT NO.:  [id number]   MEDICAL RECORD NO.:  [id number]          PATIENT TYPE:  INP   LOCATION:  [id number]                         FACILITY:  [facility]   PHYSICIAN:  Wonda Horner, M.D.   DATE OF BIRTH:  12-Mar-1931   DATE OF PROCEDURE:  08/06/2005  DATE OF DISCHARGE:                                 OPERATIVE REPORT   INDICATIONS FOR PROCEDURE:  Weight loss, anemia, rule out colon lesion.   Informed consent was obtained after explanation of the risks of bleeding,  infection and perforation.   PREMEDICATION:  Fentanyl 60 mcg IV, Versed 6 mg IV.   PROCEDURE:  With the patient in the left lateral decubitus position, a  rectal exam was performed.  No masses were felt.  The Olympus colonoscope  was inserted into the rectum and advanced around the colon to the cecum.  Cecal landmarks were identified.  The cecum and descending colon were  normal.  The transverse colon normal.  The descending colon, sigmoid and  rectum were normal.  She tolerated the procedure well without complications.   IMPRESSION:  Normal colonoscopy to the cecum.           ______________________________  Wonda Horner, M.D.     SFG/MEDQ  D:  08/06/2005  T:  08/06/2005  Job:  [id number]   cc:   Janifer Adie, M.D.  Fax: [phone]   Ronald Lobo, M.D.  Fax: [place], D.O.

## 2010-07-25 NOTE — Consult Note (Signed)
Jackie Alvarez, Jackie Alvarez                 ACCOUNT NO.:  [id number]   MEDICAL RECORD NO.:  [id number]          PATIENT TYPE:  EMS   LOCATION:  ED                           FACILITY:  [facility]   PHYSICIAN:  Joyice Faster. Cornett, M.D.DATE OF BIRTH:  1931-05-30   DATE OF CONSULTATION:  DATE OF DISCHARGE:                                   CONSULTATION   PRIMARY CARE PHYSICIAN:  Dr. Bradd Burner   REASON FOR CONSULTATION:  Abdominal pain.   HISTORY OF PRESENT ILLNESS:  The patient is a 77 year old female who has had  a 50-month history of progressive weight loss, anorexia, epigastric and left  shoulder pain.  This presented about 3 months ago.  She has been worked up  extensively but no cause has been found.  I was asked to see the patient at  the request Dr. Bradd Burner due to the fact that she has had the epigastric  pain, weight loss and had a CT scan today which showed small loculus of  epigastric free air and some air fluid collection in the epigastrium,  worrisome for gastric perforation. The patient denies any current abdominal  pain.  She feels fine with no complaints.  She has been drinking plenty of  liquids without difficulty but has intermittent severe abdominal pain which  comes and goes.  It is not persistent.  It is located in epigastrium and  radiates to her left shoulder from time to time but is not constant.  She  does not eat much anymore since she has lost her appetite, and has had a 25  pound weight loss in the last 2-3 months.  Denies any blood in her stool and  does not have any emesis of blood.  Currently, she feels well with no pain  medicine.  Denies any dysuria, polyuria or any back pain.   PAST MEDICAL HISTORY:  1.  Severe peripheral vascular disease.  2.  Severe hypertension.  3.  Hypercholesterolemia.  4.  Type 2 diabetes mellitus.   PAST SURGICAL HISTORY:  1.  Multiple peripheral vascular procedures for claudication and rest pain.  2.  Aortobifemoral bypass.  3.  Abdominal  hysterectomy.   ALLERGIES:  IV CONTRAST DYE.   MEDICATIONS:  1.  Actos 50 mg daily.  2.  Zocor 20 mg daily.  3.  Captopril 50 mg daily.  4.  Clonidine 0.3 mg daily.  5.  Coumadin 2 mg daily.  6.  Hydrochlorothiazide 25 mg daily.  7.  Labetalol 200 mg daily.  8.  Metformin 5 mg b.i.d.   FAMILY HISTORY:  No history of malignancy, as stated by the patient, are  known by her.   SOCIAL HISTORY:  Used to be a smoker, stopped in 1998. Positive alcohol use.  Is married.   REVIEW OF SYSTEMS:  A 10-point review of systems was done.  Positive for the  above.  Denies any problems with rest pain but does have claudication.  Otherwise 10-point review of systems is negative.   PHYSICAL EXAMINATION:  VITAL SIGNS:  Temperature 98.4, blood pressure is  123/53, pulse 86.  GENERAL APPEARANCE:  Pleasant female in no apparent distress.  HEENT:  Extraocular movements are intact.  No evidence of scleral icterus.  She is blind in her left eye. Normocephalic, atraumatic.  NECK:  Supple, nontender, no thyromegaly, no masses.  CHEST:  Chest wall motion is normal.  Left breast surgically absent.  LUNGS:  Sounds are clear bilaterally.  CARDIOVASCULAR:  Regular rate and rhythm without rub, murmur or gallop.  No  tachycardia.  Poor peripheral pulses but warm extremities.  ABDOMEN:  Midline incision noted.  It is soft, nontender, no rebound, no  guarding or signs of peritonitis whatsoever.  Minimal epigastric discomfort  on palpation. No mass.  GU:  Normal female external genitalia.  No evidence of hernia.  EXTREMITIES:  Muscle tone is normal.  There is no evidence of muscle  wasting.  Multiple scars noted.  Range of motion is excellent.  PSYCHIATRIC EXAMINATION:  Mood and sensation are intact.  The patient is  appropriate in answering questions, oriented to place.  NEUROLOGIC EXAMINATION:  Intact to motor and sensory function.  Cranial  nerves II-XII are intact.  SKIN:  No obvious suspicious lesions  noted.  No nodules noted.   DIAGNOSTIC STUDIES:  I reviewed her CT scan done during her stay.  There are  fluid levels that are in the peritoneal cavity.  There are some small  loculus of air.  This was concerning for possible perforated viscus.  She  has no mass, lesion that I can see in the stomach or upper abdomen.   IMPRESSION:  History of a 25-pound weight loss, abdominal pain but otherwise  with a benign examination with  CT scan showing multiple loculus of intraperitoneal air and some air-fluid  levels worrisome for gastric perforation.   DISCUSSION:  The patient is completely asymptomatic despite her CT scan.  She received pain medicine and has no signs of peritonitis or sepsis.  Apparently she had pain about a month ago which was quite severe and went  away.  I am concerned she has a possible malignancy given the appearance of  her CT scan but I do not feel she needs any emergent exploration at this  point in time.  I would like to get a medical consult on her since she has  multiple medical problems and is a high operative risk.  She will need  surgical exploration at some point, given these findings and my concern for  possible gastric malignancy.  I am reluctant to do an endoscopy on her for  fear that if she does  have a perforation this may accentuate that. I would like to get a medical  consult to get her cleared for surgery when we get her admitted to hospital  for further testing and observation.  I have discussed this with the  patient.  She understands and agrees to proceed.      Thomas A. Cornett, M.D.  Electronically Signed     TAC/MEDQ  D:  07/31/2005  T:  08/01/2005  Job:  [id number]   cc:   Janifer Adie, M.D.  Fax: [phone]

## 2010-07-25 NOTE — Op Note (Signed)
Jackie Alvarez, Jackie Alvarez                         ACCOUNT NO.:  [id number]   MEDICAL RECORD NO.:  [id number]                   PATIENT TYPE:  INP   LOCATION:  5030                                 FACILITY:  [facility]   PHYSICIAN:  Rennis Harding, M.D.                     DATE OF BIRTH:  1931/06/03   DATE OF PROCEDURE:  DATE OF DISCHARGE:                                 OPERATIVE REPORT   DIAGNOSES:  1. Left intertrochanteric hip fracture.  2. Displaced left Colles fracture.   PROCEDURES:  1. Closed reduction and casting of left distal radius fracture using     fluoroscopy.  2. Left hip open reduction and internal fixation utilizing an intramedullary     hip screw.   SURGEON:  Rennis Harding, M.D.   ASSISTANT:  Karenann Cai, P.A.   ANESTHESIA:  General endotracheal.   COMPLICATIONS:  None.   INDICATIONS:  The patient is a 77 year old female who fell yesterday and  presented to the [facility] Emergency Department.  She was found to have a  displaced left Colles fracture and an intertrochanteric left hip fracture.  Risks, benefits, and alternatives to closed reduction of the wrist and  intramedullary nailing of the left hip were reviewed with her in detail.  She elected to proceed.  She is anticoagulated for peripheral vascular  disease and was given vitamin K before the procedure to reverse the  anticoagulation.  Postoperatively, she will be managed by the medical  service.   PROCEDURE:  The patient was preoperatively identified in the holding area,  taken to the operating room.  She underwent general endotracheal anesthesia  without difficulty.  The left wrist was examined under fluoroscopy.  Using  gentle traction and manipulation, the fracture was reduced.  We then placed  a well-padded short arm cast with a good mold dorsally.  Final AP and  lateral fluoroscopic images showed acceptable reduction of the fracture.  These images were saved.   At this point, the patient was  transferred to the fracture table.  The left  leg was placed into the fracture apparatus, gentle traction applied, and  internal rotation.  AP and lateral fluoroscopic images were used to confirm  acceptable reduction of the fracture fragments.  The hip was then prepped  and draped in the usual sterile fashion using a shower curtain.  A 3-cm  incision was made just proximal to the greater trochanter.  Dissection was  carried down through the deep fascia.  An awl was used to enter the tip of  the greater trochanter done under fluoroscopy.  A guide wire was passed  across the fracture fragments.  We used a 17-mm reamer proximally.  The 130-  degree short Gamma nail was then placed over the guide wire, across the  fracture fragments.  The guide wire was removed.  An 105-mm lag screw was  placed into  the center of the femoral head in both the AP and lateral  planes.  Gentle compression was applied across the fracture fragments.  The  set screw was placed in a sliding mode.  A 30-mm distal locking screw was  placed using the jig.  The wounds were irrigated.  Final AP and lateral  fluoroscopic images were saved.  Deep fascia was closed with an 0 Vicryl,  subcutaneous layer closed with a 2-0 Vicryl, followed by skin staples on the  skin.  Sterile dressing applied.  The patient extubated without difficulty  and transferred to the recovery room in stable condition.                                               Rennis Harding, M.D.    MC/MEDQ  D:  04/03/2002  T:  04/03/2002  Job:  [id number]

## 2010-07-25 NOTE — Op Note (Signed)
[facility]  Patient:    Jackie Alvarez, Jackie Alvarez                          MRN: [id number] Proc. Date: 11/17/99 Attending:  Nelda Severe. Kellie Simmering, M.D.                           Operative Report  PREOPERATIVE DIAGNOSIS:  Ischemic left foot secondary to failing left profunda femoris to peroneal bypass graft with proximal stenosis of distal common femoral and profunda femoris artery, and severe distal stenosis at the anastomosis of the vein graft of the peroneal artery.  POSTOPERATIVE DIAGNOSIS:  Ischemic left foot secondary to failing left profunda femoris to peroneal bypass graft with proximal stenosis of distal common femoral and profunda femoris artery, and severe distal stenosis at the anastomosis of the vein graft of the peroneal artery.  OPERATIONS: 1. Dacron patch angioplasty of left common femoral and profunda femoris artery    (profundoplasty). 2. Extension of left femoral peroneal bypass graft to a more distal site on    the peroneal artery using a reversed lesser saphenous vein graft from right    leg with intraoperative arteriogram.  SURGEON:  Nelda Severe. Kellie Simmering, M.D.  FIRST ASSISTANT:  Earnstine Regal, P.A.  ANESTHESIA:  General endotracheal.  PROCEDURE:  The patient was taken to the operating room and placed in the supine position at which time satisfactory general endotracheal anesthesia was administered.  She was then placed in the prone position and the posterior aspect of the right leg was prepped and draped in a routine sterile manner. The lesser saphenous vein was removed from the mid calf up to the distal thigh and branches ligated with 4-0 and 5-0 silk ties and divided.  It was gently dilated with heparinized saline and marked for orientation purposes.  It was a satisfactory vein being about 3 mm in size throughout.  The incision was then closed in layers with Vicryl and clips, sterile dressing applied and the patient turned into the supine  position and the left leg was prepped with Betadine scrubbing solution and draped in a routine sterile manner.  A medial incision was made below the knee through the previous scar and the distal end of the femoral peroneal vein graft was exposed at the popliteal space.  Distal to this it was enveloped with dense scar tissue where previous patch angioplasty had been performed at the distal end of the vein graft.  About five inches distally, the peroneal artery was exposed through this medial incision.  It was a very small and hypoprofused vessel, but was widely patent on the angiogram.  Attention was turned to the groin where an incision was made through the previous scar and distal end of the left limb with aortobifemoral graft was dissected free as well as the proximal common femoral artery and the femoral vessel was then dissected free completely down including the profunda femoris artery and the stump of an old femoral popliteal Gore-Tex graft which was still patent on angiogram for about 3 cm. The patient was then heparinized.  The femoral vessels were occluded with vascular clamps.  The old Gore-Tex graft was excised from the distal common femoral artery where there was a 50% stenosis on the angiogram.  Through this opening, arteriotomy was extended down the profunda about 4 cm and up into the foot of the common femoral artery anastomosis on the Dacron from the  aorta bifemoral graft.  A new piece of Dacron patch was then fashioned appropriately and sewn into place using continuous 5-0 Prolene forming a profundoplasty enlarging the outflow to the profunda which fed the vein graft which was anastomosed distally on the profunda femoris artery.  When this had been sewn in place, the clamps were released and there was an excellent pulse. Attention was turned to the calf region where the distal end of the vein graft was occluded proximally and distally with vessel loops, open with #15  blade, extended with Potts scissors.  The lesser saphenous vein was used in a reversed fashion, spatulated and anastomosed end-to-side to this graftotomy using continuous 6-0 Prolene.  Vessel loops were released and there was good flow through the vein graft.  The peroneal artery was occluded with vessel loops, open with #15 blade, extended with Potts scissors.  It would accept a 2.5 mm dilator distally and had some mild disease.  The vein was carefully measured and spatulated and anastomosed end-to-side using continuous 6-0 Prolene for the heel and interrupted 7-0 Prolene for the toe.  Following appropriate flushing, this was completed.  Vessel loops were released and there was an excellent pulse and Doppler flow in the vein graft and good Doppler flow in the peroneal artery distally.  Intraoperative arteriogram revealed a widely patent anastomosis into the peroneal artery which filled the dorsalis pedis by collaterals foot.  Protamine was then given to reverse the heparin.  Following adequate hemostasis, the wound was irrigated with saline, closed in layers of Vicryl and clips.  Sterile dressing was applied.  The patient was taken to the recovery room in satisfactory condition. DD:  11/17/99 TD:  11/17/99 Job: [id number]

## 2010-07-25 NOTE — Op Note (Signed)
NAMESHANICKA, KALTZ                 ACCOUNT NO.:  [id number]   MEDICAL RECORD NO.:  [id number]          PATIENT TYPE:  INP   LOCATION:  [id number]                         FACILITY:  [facility]   PHYSICIAN:  Ronald Lobo, M.D.   DATE OF BIRTH:  07/27/1931   DATE OF PROCEDURE:  08/04/2005  DATE OF DISCHARGE:                                 OPERATIVE REPORT   PROCEDURE:  Upper endoscopy with biopsies.   ENDOSCOPIST:  Ronald Lobo, M.D.   INDICATIONS:  This is a 77 year old female with two main GI problems, namely  a 50 pound weight loss over the past 6 months and a couple of episodes of  extreme excruciating abdominal pain over the past month, now associated with  a fluid and air collection adjacent to the stomach seen on CT scanning.  The  Gastrografin GI series raised the question of submucosal mass along the  greater curve.   FINDINGS:  No traumatic abnormalities observed.  No obvious source for  weight loss or gastric perforation.   DESCRIPTION OF PROCEDURE:  The nature, purpose, risks of the procedure had  been reviewed with the patient who provided written consent.  She was  brought in a fasted state from her hospital room to the endoscopy unit.  Sedation was fentanyl 100 mcg and Versed 10 mg without arrhythmias or  desaturation.  The Olympus video endoscope was passed under direct vision.   There was very mild distal reflux esophagitis characterized by some linear  erythema and perhaps even minimal erosions extending several centimeters of  the distal esophagus.  I would probably rate this grade 1 or grade 2  esophagitis.   The stomach contained a small amount of bile.  There were no gastric masses  as such, but on the anterior aspect of the greater curve in the midbody of  the stomach, there was an area that appeared to correlate with the area of  abnormality seen on the GI series, specifically, some redundant or puckered  mucosa, although the mucosa itself looked normal, without  nodularity  inflammation or ulceration.  There was a small central defect  raising  question of a possible earlier fistulous tract.  This area was biopsied  extensively.   Contralateral to this on the posterior aspect of the greater curve was an  area of a stellate shallow ulceration with surrounding erythema and slight  friability, without again any obvious mass effect, although part of the  stomach did seem to be slightly indented compared to the remainder of the  wall.  There was certainly no exophytic mass evident.  Multiple biopsies  were obtained from this area.   There was a small hiatal hernia with an esophageal mucosal ring.  Retroflexed viewing of the cardia was normal except for the small hiatal  hernia as seen from its inferior perspective.  The antrum, pylorus, duodenal  bulb and second duodenum looked normal but duodenal biopsies were obtained  to help exclude amyloidosis, celiac disease or other sources of weight loss.  The scope was then removed from the patient who tolerated the procedure well  and without apparent complication.   IMPRESSION:  Multiple mild and nonspecific abnormalities as described above,  without obvious source of weight loss or apparent recent gastric perforation  identified.   PLAN:  Await pathology results.           ______________________________  Ronald Lobo, M.D.     RB/MEDQ  D:  08/04/2005  T:  08/04/2005  Job:  [id number]   cc:   Nelda Severe. Kellie Simmering, M.D.  [address]  [place]  [place] [address]   Janifer Adie, M.D.  Fax: [place]. Cornett, M.D.  [place] [address]

## 2010-11-27 LAB — URINALYSIS, ROUTINE W REFLEX MICROSCOPIC
Bilirubin Urine: NEGATIVE
Glucose, UA: 100 — AB
Hgb urine dipstick: NEGATIVE
Specific Gravity, Urine: 1.021
Urobilinogen, UA: 1

## 2010-11-27 LAB — COMPREHENSIVE METABOLIC PANEL
ALT: 15
Albumin: 3.6
Alkaline Phosphatase: 78
Calcium: 9.4
Potassium: 5.7 — ABNORMAL HIGH
Sodium: 141
Total Protein: 6.1

## 2010-11-27 LAB — BASIC METABOLIC PANEL
Chloride: 102
GFR calc non Af Amer: 35 — ABNORMAL LOW
Potassium: 4.5
Sodium: 136

## 2010-11-27 LAB — CBC
MCHC: 33.8
Platelets: 208
RDW: 14.9

## 2010-11-27 LAB — URINE MICROSCOPIC-ADD ON

## 2010-11-27 LAB — DIFFERENTIAL
Basophils Relative: 0
Eosinophils Absolute: 0.2
Lymphs Abs: 1.3
Monocytes Absolute: 0.3
Monocytes Relative: 6
Neutro Abs: 3.6
Neutrophils Relative %: 65

## 2010-11-27 LAB — HEMOGLOBIN A1C
Hgb A1c MFr Bld: 8.2 — ABNORMAL HIGH
Mean Plasma Glucose: 215

## 2011-01-19 ENCOUNTER — Encounter (INDEPENDENT_AMBULATORY_CARE_PROVIDER_SITE_OTHER): Payer: Medicare Other | Admitting: Vascular Surgery

## 2011-01-19 DIAGNOSIS — I739 Peripheral vascular disease, unspecified: Secondary | ICD-10-CM

## 2011-01-19 DIAGNOSIS — Z48812 Encounter for surgical aftercare following surgery on the circulatory system: Secondary | ICD-10-CM

## 2011-01-21 ENCOUNTER — Encounter: Payer: Self-pay | Admitting: Vascular Surgery

## 2011-01-21 NOTE — Procedures (Unsigned)
BYPASS GRAFT EVALUATION  INDICATION:  Followup peripheral vascular disease  HISTORY: Diabetes:  Yes Cardiac:  No Hypertension:  Yes Smoking:  Previous Previous Surgery:  Left femoral to peroneal artery bypass graft on 11/17/1999  SINGLE LEVEL ARTERIAL EXAM                              RIGHT              LEFT Brachial: Anterior tibial: Posterior tibial: Peroneal: Ankle/brachial index:        0.65               0.65  PREVIOUS ABI:  Date:  01/17/2010  RIGHT:  0.79  LEFT:  0.84  LOWER EXTREMITY BYPASS GRAFT DUPLEX EXAM:  DUPLEX:  Elevated velocity involving the mid/proximal left femoral to peroneal artery bypass graft with peak systolic velocity of 123XX123 cm/s suggesting 50%-75% stenosis. Elevated velocities involving the left distal external iliac and profunda femoral artery suggesting 50%-75% stenosis.  IMPRESSION: 1. Native artery stenosis present as noted above. 2. Left femoral to peroneal artery bypass graft is patent with     stenosis present as noted above. 3. Bilateral ankle brachial indices are 0.65 and have decreased since     the previous study on 01/17/2010.  ___________________________________________ Nelda Severe. Kellie Simmering, M.D.  SH/MEDQ  D:  01/19/2011  T:  01/19/2011  Job:  [id number]

## 2011-01-23 ENCOUNTER — Other Ambulatory Visit: Payer: Self-pay | Admitting: Vascular Surgery

## 2011-01-27 ENCOUNTER — Encounter: Payer: Self-pay | Admitting: Vascular Surgery

## 2011-02-16 ENCOUNTER — Encounter: Payer: Self-pay | Admitting: Vascular Surgery

## 2011-02-17 ENCOUNTER — Ambulatory Visit (INDEPENDENT_AMBULATORY_CARE_PROVIDER_SITE_OTHER): Payer: Medicare Other | Admitting: Vascular Surgery

## 2011-02-17 ENCOUNTER — Other Ambulatory Visit (INDEPENDENT_AMBULATORY_CARE_PROVIDER_SITE_OTHER): Payer: Medicare Other

## 2011-02-17 ENCOUNTER — Encounter: Payer: Self-pay | Admitting: Vascular Surgery

## 2011-02-17 ENCOUNTER — Other Ambulatory Visit: Payer: Self-pay

## 2011-02-17 VITALS — BP 193/66 | HR 60 | Resp 16 | Ht 67.0 in | Wt 199.0 lb

## 2011-02-17 DIAGNOSIS — I6529 Occlusion and stenosis of unspecified carotid artery: Secondary | ICD-10-CM

## 2011-02-17 DIAGNOSIS — I70219 Atherosclerosis of native arteries of extremities with intermittent claudication, unspecified extremity: Secondary | ICD-10-CM | POA: Insufficient documentation

## 2011-02-17 DIAGNOSIS — Z01818 Encounter for other preprocedural examination: Secondary | ICD-10-CM

## 2011-02-17 NOTE — Progress Notes (Signed)
Subjective:     Patient ID: Jackie Alvarez, female   DOB: Jan 25, 1932, 77 y.o.   MRN: [id number]  HPI this 77 year old female is well known to me having had left femoral peroneal bypass grafting many years ago with revision in 2001. The graft has remained patent and we have been following it a regular basis. It now appears that she has developed a vein graft stenosis in the midportion of the bypass in the mid thigh. This could be as significant as 75-80% in severity. She complains of bilateral leg claudication symptoms which are not limiting her but no history of rest pain, numbness, or nonhealing ulcers. He denies any neurologic symptoms such as lateralizing weakness,   aphasia, amaurosis fugax, diplopia, or vision, or syncope.  Past Medical History  Diagnosis Date  . Diabetes mellitus   . Hypertension   . Leg pain   . Peripheral vascular disease   . Cancer     breast  . Hyperlipidemia   . Hemorrhoids   . Carotid artery occlusion   . Ulcer   . COPD (chronic obstructive pulmonary disease)     History  Substance Use Topics  . Smoking status: Former Smoker -- 1.0 packs/day for 45 years    Types: Cigarettes    Quit date: 01/26/1997  . Smokeless tobacco: Not on file  . Alcohol Use: No    Family History  Problem Relation Age of Onset  . Cancer Mother     stomach  . Cancer Father     liver    Allergies  Allergen Reactions  . Omnipaque (Iohexol)     Current outpatient prescriptions:amLODipine (NORVASC) 10 MG tablet, Take 10 mg by mouth daily.  , Disp: , Rfl: ;  benazepril (LOTENSIN) 40 MG tablet, Take 40 mg by mouth daily.  , Disp: , Rfl: ;  calcium-vitamin D (OSCAL 500/200 D-3) 500-200 MG-UNIT per tablet, Take 1 tablet by mouth 2 (two) times daily.  , Disp: , Rfl: ;  carvedilol (COREG) 6.25 MG tablet, Take 6.25 mg by mouth 2 (two) times daily with a meal.  , Disp: , Rfl:  ezetimibe-simvastatin (VYTORIN) 10-20 MG per tablet, Take 1 tablet by mouth at bedtime.  , Disp: , Rfl: ;   Ferrous Sulfate (IRON) 325 (65 FE) MG TABS, Take 65 mg by mouth daily.  , Disp: , Rfl: ;  glipiZIDE (GLUCOTROL) 5 MG tablet, Take 10 mg by mouth 2 (two) times daily before a meal.  , Disp: , Rfl: ;  hydrochlorothiazide (HYDRODIURIL) 25 MG tablet, Take 25 mg by mouth daily.  , Disp: , Rfl:  mirtazapine (REMERON) 15 MG tablet, Take 15 mg by mouth at bedtime.  , Disp: , Rfl: ;  omeprazole (PRILOSEC) 20 MG capsule, Take 20 mg by mouth daily.  , Disp: , Rfl: ;  pioglitazone (ACTOS) 30 MG tablet, Take 30 mg by mouth daily.  , Disp: , Rfl:   BP 193/66  Pulse 60  Resp 16  Ht 5\' 7"  (1.702 m)  Wt 199 lb (90.266 kg)  BMI 31.17 kg/m2  SpO2 95%  Body mass index is 31.17 kg/(m^2).       Review of Systems she denies chest pain, dyspnea on exertion, PND, orthopnea. She does complain of easy fatigability. She has bilateral calf claudication symptoms. She denies any neurologic symptoms. She denies hemoptysis. All other systems are negative and complete review of systems     Objective:   Physical Exam blood pressure 193/66 heart rate 60 respirations  69 General she is well-developed well-nourished female in no apparent distress alert and oriented x3 HEENT normal for age Lungs no rhonchi or wheezing Cardiovascular regular rhythm no murmurs carotid pulses are 3+ with a harsh bruit over the right carotid bifurcation no bruit on the left Abdomen soft nontender-no masses palpable Neurologic exam normal Skin free of rashes Musculoskeletal 3 major deformities Lower extremity exam reveals 3+ femoral popliteal pulse on the right well perfused right foot. Left leg has a 3+ femoral pulse no graft pulse is palpable but she does have 1-2+ posterior tibial pulse in the left foot.  I reviewed her vascular lab study done recently which revealed decrease in the ABI of her left leg from 0.84-0.65 with an area of increased velocity in the midportion of the bypass Also ordered a carotid duplex exam today because of her  carotid bruit over the right side and her history of diffuse atherosclerosis. I reviewed her carotid duplex exam and interpreted. She has mild to moderate right internal carotid flow reduction with no flow reduction in the left internal carotid.    Assessment:    functioning left femoral peroneal bypass with possible vein graft stenosis in midportion No evidence of severe carotid occlusive disease by duplex scanning    Plan:    will schedule angiography of left leg by Dr. Trula Slade on January 8 to see if vein graft stenosis needs treatment which could include PTCA or surgical treatment

## 2011-02-23 NOTE — Procedures (Unsigned)
CAROTID DUPLEX EXAM  INDICATION:  Right carotid bruit.  HISTORY: Diabetes:  Yes. Cardiac:  No. Hypertension:  Yes. Smoking:  Previously. Previous Surgery:  Status post surgery to revascularize the left lower extremity. CV History: Amaurosis Fugax No, Paresthesias No, Hemiparesis No.                                      RIGHT             LEFT Brachial systolic pressure: Brachial Doppler waveforms: Vertebral direction of flow:        Antegrade         Antegrade DUPLEX VELOCITIES (cm/sec) CCA peak systolic                   104               [id number] ECA peak systolic                   203               [id number] ICA peak systolic                   69                123456 ICA end diastolic                   18                21 PLAQUE MORPHOLOGY:                  Mixed             Mixed PLAQUE AMOUNT:                      Mild-to-moderate  Mild-to-moderate PLAQUE LOCATION:                    CCA, ICA, ECA     CCA, ICA, ECA  IMPRESSION: 1. 20% to 39% bilateral internal carotid artery stenosis. 2. Right external carotid artery stenosis.     ___________________________________________ Nelda Severe Kellie Simmering, M.D.  CI/MEDQ  D:  02/17/2011  T:  02/17/2011  Job:  [id number]

## 2011-02-27 ENCOUNTER — Encounter (HOSPITAL_COMMUNITY): Payer: Self-pay

## 2011-03-17 ENCOUNTER — Encounter (HOSPITAL_COMMUNITY)
Admission: RE | Disposition: A | Discharge status: Home / Self Care | Payer: Self-pay | Source: Ambulatory Visit | Attending: Surgery

## 2011-03-17 ENCOUNTER — Ambulatory Visit (HOSPITAL_COMMUNITY)
Admission: RE | Admit: 2011-03-17 | Discharge: 2011-03-17 | Disposition: A | Discharge status: Home / Self Care | Payer: Medicare Other | Source: Ambulatory Visit | Attending: Surgery | Admitting: Surgery

## 2011-03-17 DIAGNOSIS — I10 Essential (primary) hypertension: Secondary | ICD-10-CM | POA: Diagnosis not present

## 2011-03-17 DIAGNOSIS — J449 Chronic obstructive pulmonary disease, unspecified: Secondary | ICD-10-CM | POA: Insufficient documentation

## 2011-03-17 DIAGNOSIS — E119 Type 2 diabetes mellitus without complications: Secondary | ICD-10-CM | POA: Insufficient documentation

## 2011-03-17 DIAGNOSIS — I70409 Unspecified atherosclerosis of autologous vein bypass graft(s) of the extremities, unspecified extremity: Secondary | ICD-10-CM | POA: Insufficient documentation

## 2011-03-17 DIAGNOSIS — J4489 Other specified chronic obstructive pulmonary disease: Secondary | ICD-10-CM | POA: Insufficient documentation

## 2011-03-17 HISTORY — PX: ABDOMINAL AORTAGRAM: SHX5454

## 2011-03-17 LAB — POCT ACTIVATED CLOTTING TIME: Activated Clotting Time: 171 seconds

## 2011-03-17 SURGERY — ABDOMINAL AORTAGRAM
Anesthesia: LOCAL

## 2011-03-17 MED ORDER — MIDAZOLAM HCL 2 MG/2ML IJ SOLN
INTRAMUSCULAR | Status: AC
  Filled 2011-03-17: qty 2

## 2011-03-17 MED ORDER — FENTANYL CITRATE 0.05 MG/ML IJ SOLN
INTRAMUSCULAR | Status: AC
  Filled 2011-03-17: qty 2

## 2011-03-17 MED ORDER — ACETAMINOPHEN 325 MG PO TABS: 650.0000 mg | ORAL_TABLET | ORAL | Status: DC | PRN

## 2011-03-17 MED ORDER — SODIUM CHLORIDE 0.9 % IV SOLN: 1.0000 mL/kg/h | INTRAVENOUS | Status: DC

## 2011-03-17 MED ORDER — HEPARIN (PORCINE) IN NACL 2-0.9 UNIT/ML-% IJ SOLN
INTRAMUSCULAR | Status: AC
  Filled 2011-03-17: qty 1000

## 2011-03-17 MED ORDER — HYDRALAZINE HCL 20 MG/ML IJ SOLN: 10.0000 mg | INTRAMUSCULAR | Status: DC | PRN

## 2011-03-17 MED ORDER — OXYCODONE-ACETAMINOPHEN 5-325 MG PO TABS: 1.0000 | ORAL_TABLET | ORAL | Status: DC | PRN

## 2011-03-17 MED ORDER — LIDOCAINE HCL (PF) 1 % IJ SOLN
INTRAMUSCULAR | Status: AC
  Filled 2011-03-17: qty 30

## 2011-03-17 MED ORDER — LABETALOL HCL 5 MG/ML IV SOLN: 10.0000 mg | INTRAVENOUS | Status: DC | PRN

## 2011-03-17 MED ORDER — ONDANSETRON HCL 4 MG/2ML IJ SOLN: 4.0000 mg | Freq: Four times a day (QID) | INTRAMUSCULAR | Status: DC | PRN

## 2011-03-17 MED ORDER — HEPARIN SODIUM (PORCINE) 1000 UNIT/ML IJ SOLN
INTRAMUSCULAR | Status: AC
  Filled 2011-03-17: qty 1

## 2011-03-17 MED ORDER — SODIUM CHLORIDE 0.9 % IV SOLN
INTRAVENOUS | Status: DC
  Administered 2011-03-17: 07:00:00 via INTRAVENOUS

## 2011-03-17 MED ORDER — METOPROLOL TARTRATE 1 MG/ML IV SOLN: 2.0000 mg | INTRAVENOUS | Status: DC | PRN

## 2011-03-17 MED ORDER — MORPHINE SULFATE 2 MG/ML IJ SOLN: 2.0000 mg | INTRAMUSCULAR | Status: DC | PRN

## 2011-03-17 NOTE — H&P (View-Only) (Signed)
Subjective:     Patient ID: Jackie Alvarez, female   DOB: 1931-06-06, 78 y.o.   MRN: [id number]  HPI this 77 year old female is well known to me having had left femoral peroneal bypass grafting many years ago with revision in 2001. The graft has remained patent and we have been following it a regular basis. It now appears that she has developed a vein graft stenosis in the midportion of the bypass in the mid thigh. This could be as significant as 75-80% in severity. She complains of bilateral leg claudication symptoms which are not limiting her but no history of rest pain, numbness, or nonhealing ulcers. He denies any neurologic symptoms such as lateralizing weakness,   aphasia, amaurosis fugax, diplopia, or vision, or syncope.  Past Medical History  Diagnosis Date  . Diabetes mellitus   . Hypertension   . Leg pain   . Peripheral vascular disease   . Cancer     breast  . Hyperlipidemia   . Hemorrhoids   . Carotid artery occlusion   . Ulcer   . COPD (chronic obstructive pulmonary disease)     History  Substance Use Topics  . Smoking status: Former Smoker -- 1.0 packs/day for 45 years    Types: Cigarettes    Quit date: 01/26/1997  . Smokeless tobacco: Not on file  . Alcohol Use: No    Family History  Problem Relation Age of Onset  . Cancer Mother     stomach  . Cancer Father     liver    Allergies  Allergen Reactions  . Omnipaque (Iohexol)     Current outpatient prescriptions:amLODipine (NORVASC) 10 MG tablet, Take 10 mg by mouth daily.  , Disp: , Rfl: ;  benazepril (LOTENSIN) 40 MG tablet, Take 40 mg by mouth daily.  , Disp: , Rfl: ;  calcium-vitamin D (OSCAL 500/200 D-3) 500-200 MG-UNIT per tablet, Take 1 tablet by mouth 2 (two) times daily.  , Disp: , Rfl: ;  carvedilol (COREG) 6.25 MG tablet, Take 6.25 mg by mouth 2 (two) times daily with a meal.  , Disp: , Rfl:  ezetimibe-simvastatin (VYTORIN) 10-20 MG per tablet, Take 1 tablet by mouth at bedtime.  , Disp: , Rfl: ;   Ferrous Sulfate (IRON) 325 (65 FE) MG TABS, Take 65 mg by mouth daily.  , Disp: , Rfl: ;  glipiZIDE (GLUCOTROL) 5 MG tablet, Take 10 mg by mouth 2 (two) times daily before a meal.  , Disp: , Rfl: ;  hydrochlorothiazide (HYDRODIURIL) 25 MG tablet, Take 25 mg by mouth daily.  , Disp: , Rfl:  mirtazapine (REMERON) 15 MG tablet, Take 15 mg by mouth at bedtime.  , Disp: , Rfl: ;  omeprazole (PRILOSEC) 20 MG capsule, Take 20 mg by mouth daily.  , Disp: , Rfl: ;  pioglitazone (ACTOS) 30 MG tablet, Take 30 mg by mouth daily.  , Disp: , Rfl:   BP 193/66  Pulse 60  Resp 16  Ht 5\' 7"  (1.702 m)  Wt 199 lb (90.266 kg)  BMI 31.17 kg/m2  SpO2 95%  Body mass index is 31.17 kg/(m^2).       Review of Systems she denies chest pain, dyspnea on exertion, PND, orthopnea. She does complain of easy fatigability. She has bilateral calf claudication symptoms. She denies any neurologic symptoms. She denies hemoptysis. All other systems are negative and complete review of systems     Objective:   Physical Exam blood pressure 193/66 heart rate 60 respirations  42 General she is well-developed well-nourished female in no apparent distress alert and oriented x3 HEENT normal for age Lungs no rhonchi or wheezing Cardiovascular regular rhythm no murmurs carotid pulses are 3+ with a harsh bruit over the right carotid bifurcation no bruit on the left Abdomen soft nontender-no masses palpable Neurologic exam normal Skin free of rashes Musculoskeletal 3 major deformities Lower extremity exam reveals 3+ femoral popliteal pulse on the right well perfused right foot. Left leg has a 3+ femoral pulse no graft pulse is palpable but she does have 1-2+ posterior tibial pulse in the left foot.  I reviewed her vascular lab study done recently which revealed decrease in the ABI of her left leg from 0.84-0.65 with an area of increased velocity in the midportion of the bypass Also ordered a carotid duplex exam today because of her  carotid bruit over the right side and her history of diffuse atherosclerosis. I reviewed her carotid duplex exam and interpreted. She has mild to moderate right internal carotid flow reduction with no flow reduction in the left internal carotid.    Assessment:    functioning left femoral peroneal bypass with possible vein graft stenosis in midportion No evidence of severe carotid occlusive disease by duplex scanning    Plan:    will schedule angiography of left leg by Dr. Trula Slade on January 8 to see if vein graft stenosis needs treatment which could include PTCA or surgical treatment

## 2011-03-17 NOTE — Op Note (Signed)
[facility]  Patient name: Jackie Alvarez MRN: [id number] DOB: 12/21/1931 Sex: female  03/17/2011 Pre-operative Diagnosis: Left bypass graft stenosis Post-operative diagnosis:  Same Surgeon:  Eldridge Abrahams Procedure Performed:  1.  bilateral ultrasound-guided access, bilateral common femoral artery  2.  abdominal aortogram  3.  first order catheterization  4.  bilateral lower sternum a runoff  5.  angioplasty left femoral peroneal bypass graft  6.  followup x1   Indications:  The patient was seen in the office by Dr. Kellie Simmering. She is status post aortobifemoral bypass graft and left femoral peroneal bypass graft. Surveillance ultrasound revealed a approximately 85% stenosis within the bypass graft. Her ankle-brachial indices had dropped approximately 20%. She was having symptoms of mild claudication on the left leg. She comes in today for arteriogram and possible intervention.  Procedure:  The patient was identified in the holding area and taken to room 8.  The patient was then placed supine on the table and prepped and draped in the usual sterile fashion.  A time out was called.  Ultrasound was used to evaluate the right common femoral artery.  It was patent .  A digital ultrasound image was acquired.  One percent lidocaine was used for local anesthesia. The right femoral artery was then accessed under ultrasound guidance with an 18-gauge needle. An 035 wire was advanced into the aorta under fluoroscopic visualization and a 5 French sheath was placed. Over the wire an omni-flush catheter was advanced to the level of L1 and an abdominal aortogram was performed. The catheter was then pulled down to the bifurcation and bilateral lower sternum a runoff was performed. In order to obtain more selected images of the left leg a SOS 1 catheter was placed into the left common iliac artery and left leg runoff additional images were acquired.   Findings:   Aortogram:  The  visualized portions of the suprarenal abdominal aorta showed no significant disease. There is no evidence of renal artery stenosis. An aortobifemoral bypass graft is visualized which is widely patent.  Right Lower Extremity:  The right femoral anastomosis is widely patent. The profunda femoral artery is patent. The superficial femoral artery is patent however there is approximately 80% stenosis in the adductor canal. The popliteal artery is patent throughout it's course with three-vessel runoff.  Left Lower Extremity:  The left femoral anastomosis is widely patent. There is a femoral peroneal bypass graft which looks to originate off of the profundofemoral artery. There appears to be a high-grade stenosis at the proximal anastomosis. The bypass graft otherwise is patent throughout it's course the distal anastomosis is widely patent. They're single vessel runoff via the peroneal artery which reconstitutes the posterior tibial artery at the ankle..  Intervention:  At this point in time the decision was made to intervene. Antegrade access in the left groin was required. Ultrasound identified the left femoral artery and a ultrasound image was acquired. The left femoral artery was then accessed under ultrasound guidance with a micropuncture needle. An 018 wire was advanced without resistance and a micropuncture sheath was placed. Over a Careers adviser a 5 French sheath was placed. The patient was then fully heparinized. I then used an 014 [place] Core wire to access the bypass graft. I then selected a 4 x 2 balloon and performed balloon angioplasty within the area of stenosis. The balloon was taken to 12 atmospheres and held for 90 seconds. A completion arteriogram was performed which revealed improved a  suboptimal result. I then upsized to a 5 x 2 balloon to perform balloon angioplasty. The balloon was taken to 11 atmospheres and held for 90 seconds. Completion evaluation revealed significant improvement throughout the  area of stenosis. Runoff remained unchanged. At this point the decision was made to terminate the procedure. Catheters and wires were removed. The patient taken the holding area for sheath pull once her coag laser profile corrects.  Impression:  #1  widely patent aorta bifemoral bypass graft  #2  successful balloon angioplasty of the left femoral peroneal bypass graft bypass graft stenosis using a 5 mm balloon  #3  high grade, 80% stenosis within the right superficial femoral artery     V. Annamarie Major, M.D. [facility] [place] Office: [phone] Pager:  [phone]

## 2011-03-17 NOTE — Interval H&P Note (Signed)
History and Physical Interval Note:  03/17/2011 7:54 AM  Jackie Alvarez  has presented today for surgery, with the diagnosis of pvd / left femoral peroneal vein graft stenosis  The various methods of treatment have been discussed with the patient and family. After consideration of risks, benefits and other options for treatment, the patient has consented to  Procedure(s): ABDOMINAL AORTAGRAM as a surgical intervention .  The patients' history has been reviewed, patient examined, no change in status, stable for surgery.  I have reviewed the patients' chart and labs.  Questions were answered to the patient's satisfaction.     BRABHAM IV, V. WELLS

## 2011-03-17 NOTE — Progress Notes (Signed)
Notified Dr Trula Slade that computer says pt is allergic to the dye, pt states caused her to vomit 1 time.  Reviewed pt's chart , pt has had many CT scans no mention of dye allergy or any pre-treatment for dye allergy.  No new orders.

## 2011-03-19 LAB — POCT I-STAT, CHEM 8
Chloride: 108 mEq/L (ref 96–112)
Glucose, Bld: 195 mg/dL — ABNORMAL HIGH (ref 70–99)
HCT: 34 % — ABNORMAL LOW (ref 36.0–46.0)
Potassium: 4.3 mEq/L (ref 3.5–5.1)
Sodium: 140 mEq/L (ref 135–145)

## 2011-03-27 HISTORY — PX: ABDOMINAL AORTAGRAM: SHX5706

## 2011-04-01 DIAGNOSIS — I10 Essential (primary) hypertension: Secondary | ICD-10-CM | POA: Diagnosis not present

## 2011-04-01 DIAGNOSIS — F329 Major depressive disorder, single episode, unspecified: Secondary | ICD-10-CM | POA: Diagnosis not present

## 2011-04-01 DIAGNOSIS — E1129 Type 2 diabetes mellitus with other diabetic kidney complication: Secondary | ICD-10-CM | POA: Diagnosis not present

## 2011-04-01 DIAGNOSIS — E7800 Pure hypercholesterolemia, unspecified: Secondary | ICD-10-CM | POA: Diagnosis not present

## 2011-04-01 DIAGNOSIS — I798 Other disorders of arteries, arterioles and capillaries in diseases classified elsewhere: Secondary | ICD-10-CM | POA: Diagnosis not present

## 2011-04-06 ENCOUNTER — Encounter: Payer: Self-pay | Admitting: Vascular Surgery

## 2011-04-07 ENCOUNTER — Encounter: Payer: Self-pay | Admitting: Vascular Surgery

## 2011-04-07 ENCOUNTER — Ambulatory Visit (INDEPENDENT_AMBULATORY_CARE_PROVIDER_SITE_OTHER): Payer: Medicare Other | Admitting: Vascular Surgery

## 2011-04-07 VITALS — BP 183/77 | HR 55 | Resp 16 | Ht 67.0 in | Wt 203.0 lb

## 2011-04-07 DIAGNOSIS — I70219 Atherosclerosis of native arteries of extremities with intermittent claudication, unspecified extremity: Secondary | ICD-10-CM | POA: Diagnosis not present

## 2011-04-07 NOTE — Progress Notes (Signed)
Subjective:     Patient ID: Jackie Alvarez, female   DOB: 1931-03-16, 78 y.o.   MRN: [id number]  HPI this 78 year old female returns for initial followup regarding the recent angiogram performed by Dr. Trula Slade to assess a possible vein graft stenosis in the left leg. She has a left femoral to peroneal bypass performed by me in 2001 there was a proximal anastomotic stenosis between the vein graft and the profunda femoris artery which appeared to be 75% and this was treated with balloon angioplasty by Dr. Trula Slade. This looked very good following the angioplasty she states that she is not having significant claudication symptoms in either leg at the present time.  Past Medical History  Diagnosis Date  . Diabetes mellitus   . Hypertension   . Leg pain   . Peripheral vascular disease   . Cancer     breast  . Hyperlipidemia   . Hemorrhoids   . Carotid artery occlusion   . Ulcer   . COPD (chronic obstructive pulmonary disease)     History  Substance Use Topics  . Smoking status: Former Smoker -- 1.0 packs/day for 45 years    Types: Cigarettes    Quit date: 01/26/1997  . Smokeless tobacco: Not on file  . Alcohol Use: No    Family History  Problem Relation Age of Onset  . Cancer Mother     stomach  . Cancer Father     liver    Allergies  Allergen Reactions  . Omnipaque (Iohexol) Other (See Comments)    unknown    Current outpatient prescriptions:amLODipine (NORVASC) 10 MG tablet, Take 10 mg by mouth daily.  , Disp: , Rfl: ;  benazepril (LOTENSIN) 40 MG tablet, Take 40 mg by mouth daily.  , Disp: , Rfl: ;  calcium-vitamin D (OSCAL 500/200 D-3) 500-200 MG-UNIT per tablet, Take 1 tablet by mouth 2 (two) times daily.  , Disp: , Rfl: ;  carvedilol (COREG) 6.25 MG tablet, Take 6.25 mg by mouth 2 (two) times daily with a meal.  , Disp: , Rfl:  Ferrous Sulfate (IRON) 325 (65 FE) MG TABS, Take 325 mg by mouth daily. , Disp: , Rfl: ;  fish oil-omega-3 fatty acids 1000 MG capsule, Take 1 g by  mouth 2 (two) times daily.  , Disp: , Rfl: ;  glipiZIDE (GLUCOTROL) 5 MG tablet, Take 10 mg by mouth 2 (two) times daily before a meal.  , Disp: , Rfl: ;  hydrochlorothiazide (HYDRODIURIL) 25 MG tablet, Take 25 mg by mouth daily.  , Disp: , Rfl:  mirtazapine (REMERON) 15 MG tablet, Take 15 mg by mouth every morning. , Disp: , Rfl: ;  omeprazole (PRILOSEC) 20 MG capsule, Take 20 mg by mouth daily.  , Disp: , Rfl: ;  pioglitazone (ACTOS) 45 MG tablet, Take 45 mg by mouth daily.  , Disp: , Rfl: ;  simvastatin (ZOCOR) 20 MG tablet, Take 20 mg by mouth at bedtime.  , Disp: , Rfl: ;  sitaGLIPtin (JANUVIA) 100 MG tablet, Take 50 mg by mouth daily.  , Disp: , Rfl:   BP 183/77  Pulse 55  Resp 16  Ht 5\' 7"  (1.702 m)  Wt 203 lb (92.08 kg)  BMI 31.79 kg/m2  SpO2 99%  Body mass index is 31.79 kg/(m^2).        Review of Systems*pain, dyspnea on exertion, PND, orthopnea, hemoptysis, or severe claudication or rest pain     Objective:   Physical Exam pressure 183/77 heart  rate 55 respirations 16 General well-developed well-nourished female in no apparent distress alert and oriented x3 Lower extremity exam reveals 2-3+ femoral pulses palpable bilaterally. Popliteal pulses are difficult to palpate. Both feet are well perfused. 2+ left posterior tibial pulse palpable.  Today I reviewed the angiogram and angioplasty performed by Dr. Trula Slade on January 13.    Assessment:    doing well post angioplasty proximal anastomosis left profunda to peroneal bypass graft Plan:     Return in 6 months for scan of left leg bypass to watch for restenosis

## 2011-04-07 NOTE — Progress Notes (Signed)
Patient ID: Jackie Alvarez, female   DOB: 08-16-1931, 78 y.o.   MRN: [id number]

## 2011-04-28 DIAGNOSIS — L988 Other specified disorders of the skin and subcutaneous tissue: Secondary | ICD-10-CM | POA: Diagnosis not present

## 2011-04-28 DIAGNOSIS — B353 Tinea pedis: Secondary | ICD-10-CM | POA: Diagnosis not present

## 2011-04-28 DIAGNOSIS — L608 Other nail disorders: Secondary | ICD-10-CM | POA: Diagnosis not present

## 2011-04-28 DIAGNOSIS — E1149 Type 2 diabetes mellitus with other diabetic neurological complication: Secondary | ICD-10-CM | POA: Diagnosis not present

## 2011-06-30 DIAGNOSIS — R05 Cough: Secondary | ICD-10-CM | POA: Diagnosis not present

## 2011-06-30 DIAGNOSIS — E7800 Pure hypercholesterolemia, unspecified: Secondary | ICD-10-CM | POA: Diagnosis not present

## 2011-06-30 DIAGNOSIS — E1129 Type 2 diabetes mellitus with other diabetic kidney complication: Secondary | ICD-10-CM | POA: Diagnosis not present

## 2011-06-30 DIAGNOSIS — I10 Essential (primary) hypertension: Secondary | ICD-10-CM | POA: Diagnosis not present

## 2011-07-08 DIAGNOSIS — R05 Cough: Secondary | ICD-10-CM | POA: Diagnosis not present

## 2011-07-08 DIAGNOSIS — E1129 Type 2 diabetes mellitus with other diabetic kidney complication: Secondary | ICD-10-CM | POA: Diagnosis not present

## 2011-07-08 DIAGNOSIS — I10 Essential (primary) hypertension: Secondary | ICD-10-CM | POA: Diagnosis not present

## 2011-08-04 DIAGNOSIS — M79609 Pain in unspecified limb: Secondary | ICD-10-CM | POA: Diagnosis not present

## 2011-08-04 DIAGNOSIS — B351 Tinea unguium: Secondary | ICD-10-CM | POA: Diagnosis not present

## 2011-08-05 DIAGNOSIS — Z1231 Encounter for screening mammogram for malignant neoplasm of breast: Secondary | ICD-10-CM | POA: Diagnosis not present

## 2011-09-29 ENCOUNTER — Other Ambulatory Visit: Payer: Self-pay | Admitting: *Deleted

## 2011-09-29 DIAGNOSIS — G609 Hereditary and idiopathic neuropathy, unspecified: Secondary | ICD-10-CM | POA: Diagnosis not present

## 2011-09-29 DIAGNOSIS — E1149 Type 2 diabetes mellitus with other diabetic neurological complication: Secondary | ICD-10-CM | POA: Diagnosis not present

## 2011-09-29 DIAGNOSIS — I70219 Atherosclerosis of native arteries of extremities with intermittent claudication, unspecified extremity: Secondary | ICD-10-CM

## 2011-09-29 DIAGNOSIS — E1129 Type 2 diabetes mellitus with other diabetic kidney complication: Secondary | ICD-10-CM | POA: Diagnosis not present

## 2011-09-29 DIAGNOSIS — E7800 Pure hypercholesterolemia, unspecified: Secondary | ICD-10-CM | POA: Diagnosis not present

## 2011-09-29 DIAGNOSIS — Z48812 Encounter for surgical aftercare following surgery on the circulatory system: Secondary | ICD-10-CM

## 2011-09-29 DIAGNOSIS — J310 Chronic rhinitis: Secondary | ICD-10-CM | POA: Diagnosis not present

## 2011-09-29 DIAGNOSIS — I10 Essential (primary) hypertension: Secondary | ICD-10-CM | POA: Diagnosis not present

## 2011-10-05 ENCOUNTER — Encounter: Payer: Self-pay | Admitting: Neurosurgery

## 2011-10-06 ENCOUNTER — Encounter (INDEPENDENT_AMBULATORY_CARE_PROVIDER_SITE_OTHER): Payer: Medicare Other | Admitting: *Deleted

## 2011-10-06 ENCOUNTER — Ambulatory Visit (INDEPENDENT_AMBULATORY_CARE_PROVIDER_SITE_OTHER): Payer: Medicare Other | Admitting: Neurosurgery

## 2011-10-06 ENCOUNTER — Encounter: Payer: Self-pay | Admitting: Neurosurgery

## 2011-10-06 VITALS — BP 177/61 | HR 52 | Resp 18 | Ht 67.0 in | Wt 204.0 lb

## 2011-10-06 DIAGNOSIS — I739 Peripheral vascular disease, unspecified: Secondary | ICD-10-CM

## 2011-10-06 DIAGNOSIS — Z48812 Encounter for surgical aftercare following surgery on the circulatory system: Secondary | ICD-10-CM

## 2011-10-06 DIAGNOSIS — I70219 Atherosclerosis of native arteries of extremities with intermittent claudication, unspecified extremity: Secondary | ICD-10-CM

## 2011-10-06 DIAGNOSIS — I6529 Occlusion and stenosis of unspecified carotid artery: Secondary | ICD-10-CM | POA: Diagnosis not present

## 2011-10-06 NOTE — Progress Notes (Signed)
[facility] Office Note  CC: Six-month surveillance status post bypass graft for PVD Referring Physician: Kellie Simmering  History of Present Illness: 78 year old female patient of Dr. Kellie Simmering who is a significant peripheral vascular disease history with intervention. Most notable is a left femoral to perineal bypass graft in 2001. The patient also underwent a PTA of the left femoral bypass graft with Dr. Trula Slade in January 2013. The patient states she does have leg pain when she walks however, she states this is nothing new and has been the same for "years". The patient denies rest pain, she denies open ulcerations on the lower extremities.  Past Medical History  Diagnosis Date  . Diabetes mellitus   . Hypertension   . Leg pain   . Peripheral vascular disease   . Cancer     breast  . Hyperlipidemia   . Hemorrhoids   . Carotid artery occlusion   . Ulcer   . COPD (chronic obstructive pulmonary disease)     ROS: [x]  Positive   [ ]  Denies    General: [ ]  Weight loss, [ ]  Fever, [ ]  chills Neurologic: [ ]  Dizziness, [ ]  Blackouts, [ ]  Seizure [ ]  Stroke, [ ]  "Mini stroke", [ ]  Slurred speech, [ ]  Temporary blindness; [ ]  weakness in arms or legs, [ ]  Hoarseness Cardiac: [ ]  Chest pain/pressure, [ ]  Shortness of breath at rest [ ]  Shortness of breath with exertion, [ ]  Atrial fibrillation or irregular heartbeat Vascular: [ ]  Pain in legs with walking, [ ]  Pain in legs at rest, [ ]  Pain in legs at night,  [ ]  Non-healing ulcer, [ ]  Blood clot in vein/DVT,   Pulmonary: [ ]  Home oxygen, [ ]  Productive cough, [ ]  Coughing up blood, [ ]  Asthma,  [ ]  Wheezing Musculoskeletal:  [ ]  Arthritis, [ ]  Low back pain, [ ]  Joint pain Hematologic: [ ]  Easy Bruising, [ ]  Anemia; [ ]  Hepatitis Gastrointestinal: [ ]  Blood in stool, [ ]  Gastroesophageal Reflux/heartburn, [ ]  Trouble swallowing Urinary: [ ]  chronic Kidney disease, [ ]  on HD - [ ]  MWF or [ ]  TTHS, [ ]  Burning  with urination, [ ]  Difficulty urinating Skin: [ ]  Rashes, [ ]  Wounds Psychological: [ ]  Anxiety, [ ]  Depression   Social History History  Substance Use Topics  . Smoking status: Former Smoker -- 1.0 packs/day for 45 years    Types: Cigarettes    Quit date: 01/26/1997  . Smokeless tobacco: Not on file  . Alcohol Use: No    Family History Family History  Problem Relation Age of Onset  . Cancer Mother     stomach  . Cancer Father     liver    Allergies  Allergen Reactions  . Omnipaque (Iohexol) Other (See Comments)    unknown    Current Outpatient Prescriptions  Medication Sig Dispense Refill  . amLODipine (NORVASC) 10 MG tablet Take 10 mg by mouth daily.        . benazepril (LOTENSIN) 40 MG tablet Take 40 mg by mouth daily.        . calcium-vitamin D (OSCAL 500/200 D-3) 500-200 MG-UNIT per tablet Take 1 tablet by mouth 2 (two) times daily.        . carvedilol (COREG) 6.25 MG tablet Take 6.25 mg by mouth 2 (two) times daily with a meal.        . Ferrous Sulfate (IRON) 325 (65 FE) MG TABS Take 325  mg by mouth daily.       . fish oil-omega-3 fatty acids 1000 MG capsule Take 1 g by mouth 2 (two) times daily.        Marland Kitchen glipiZIDE (GLUCOTROL) 5 MG tablet Take 10 mg by mouth 2 (two) times daily before a meal.        . hydrochlorothiazide (HYDRODIURIL) 25 MG tablet Take 25 mg by mouth daily.        . mirtazapine (REMERON) 15 MG tablet Take 15 mg by mouth every morning.       Marland Kitchen omeprazole (PRILOSEC) 20 MG capsule Take 20 mg by mouth daily.        . pioglitazone (ACTOS) 45 MG tablet Take 45 mg by mouth daily.        . simvastatin (ZOCOR) 20 MG tablet Take 20 mg by mouth at bedtime.        . sitaGLIPtin (JANUVIA) 100 MG tablet Take 50 mg by mouth daily.          Physical Examination  Filed Vitals:   10/06/11 1154  BP: 177/61  Pulse: 52  Resp: 18    Body mass index is 31.95 kg/(m^2).  General:  WDWN in NAD Gait: Normal HEENT: WNL Eyes: Pupils equal Pulmonary: normal  non-labored breathing , without Rales, rhonchi,  wheezing Cardiac: RRR, without  Murmurs, rubs or gallops; No carotid bruits Abdomen: soft, NT, no masses Skin: no rashes, ulcers noted Vascular Exam/Pulses: Lower extremity pulses are not palpable, femoral pulses are palpable bilaterally, no bruits are heard  Extremities without ischemic changes, no Gangrene , no cellulitis; no open wounds;  Musculoskeletal: no muscle wasting or atrophy  Neurologic: A&O X 3; Appropriate Affect ; SENSATION: normal; MOTOR FUNCTION:  moving all extremities equally. Speech is fluent/normal  Non-Invasive Vascular Imaging: ABIs today are 0.64 on the right, 0.85 on the left, in November 2012 the right was 0.65 the left was 0.65. Bypass graft evaluation shows a patent left lower extremity graft.  ASSESSMENT/PLAN: This patient is doing well with chronic lower extremity pain but declines any further diagnostics or intervention at this time. The patient will followup in 6 months for repeat bypass graft evaluation and ABIs. The patient's questions were encouraged and answered, she is in agreement with this plan.  Beatris Ship ANP  Clinic M.D.: Kellie Simmering

## 2011-10-06 NOTE — Addendum Note (Signed)
Addended by: Mena Goes on: 10/06/2011 02:34 PM   Modules accepted: Orders

## 2011-10-13 DIAGNOSIS — E1149 Type 2 diabetes mellitus with other diabetic neurological complication: Secondary | ICD-10-CM | POA: Diagnosis not present

## 2011-10-13 DIAGNOSIS — M79609 Pain in unspecified limb: Secondary | ICD-10-CM | POA: Diagnosis not present

## 2011-10-13 DIAGNOSIS — B351 Tinea unguium: Secondary | ICD-10-CM | POA: Diagnosis not present

## 2011-10-19 NOTE — Procedures (Unsigned)
BYPASS GRAFT EVALUATION  INDICATION:  Follow up left lower extremity bypass graft.  HISTORY: Diabetes:  Yes. Cardiac:  No. Hypertension:  Yes. Smoking:  Previous. Previous Surgery:  Aortobifemoral bypass graft November 1988, 2001 left profunda femoris-to-peroneal bypass graft using lesser saphenous vein with multiple revisions of the distal anastomosis 03/17/2011, PTA of left femoral-peroneal bypass graft.  SINGLE LEVEL ARTERIAL EXAM                              RIGHT              LEFT Brachial: Anterior tibial: Posterior tibial: Peroneal: Ankle/brachial index:        0.65               0.85  PREVIOUS ABI:  Date:  01/19/2011  RIGHT:  0.65  LEFT:  0.65  LOWER EXTREMITY BYPASS GRAFT DUPLEX EXAM:  DUPLEX:  Velocities are within normal limits throughout the left lower extremity bypass graft.  IMPRESSION: 1. Patent left profunda femoris-to-peroneal bypass graft. 2. Technically difficult exam due to body habitus/depth of graft and     multiple revascularizations. 3. See attached.  ___________________________________________ Nelda Severe. Kellie Simmering, M.D.  SS/MEDQ  D:  10/06/2011  T:  10/06/2011  Job:  [id number]

## 2011-12-01 DIAGNOSIS — E1129 Type 2 diabetes mellitus with other diabetic kidney complication: Secondary | ICD-10-CM | POA: Diagnosis not present

## 2011-12-01 DIAGNOSIS — Z23 Encounter for immunization: Secondary | ICD-10-CM | POA: Diagnosis not present

## 2011-12-01 DIAGNOSIS — I10 Essential (primary) hypertension: Secondary | ICD-10-CM | POA: Diagnosis not present

## 2011-12-28 ENCOUNTER — Telehealth: Payer: Self-pay

## 2011-12-28 DIAGNOSIS — M7989 Other specified soft tissue disorders: Secondary | ICD-10-CM

## 2011-12-28 NOTE — Telephone Encounter (Signed)
Pt. Called to report swelling and bruising of right lower extremity from foot to knee.  Stated she noticed that the side of her right leg was swollen and bruised yesterday AM.  Denies any pain or tenderness in the right lower leg.  Also states that after elevating yesterday, the swelling did not go down.  Discussed w/ Dr. Kellie Simmering.  Recommends pt. Be scheduled 12/29/11 for a venous duplex and office exam with the nurse practitioner.

## 2011-12-28 NOTE — Telephone Encounter (Signed)
I scheduled an appt per Carol's instructions for 12/29/11 at 9:30am for her venous doppler and to see NP at 10:20am. The pt is aware of this appt. awt

## 2011-12-29 ENCOUNTER — Ambulatory Visit (INDEPENDENT_AMBULATORY_CARE_PROVIDER_SITE_OTHER): Payer: Medicare Other | Admitting: Neurosurgery

## 2011-12-29 ENCOUNTER — Ambulatory Visit (INDEPENDENT_AMBULATORY_CARE_PROVIDER_SITE_OTHER): Payer: Medicare Other | Admitting: Vascular Surgery

## 2011-12-29 ENCOUNTER — Encounter: Payer: Self-pay | Admitting: Neurosurgery

## 2011-12-29 VITALS — BP 186/58 | HR 57 | Resp 18 | Ht 66.5 in | Wt 203.0 lb

## 2011-12-29 DIAGNOSIS — M7989 Other specified soft tissue disorders: Secondary | ICD-10-CM

## 2011-12-29 DIAGNOSIS — T148XXA Other injury of unspecified body region, initial encounter: Secondary | ICD-10-CM | POA: Diagnosis not present

## 2011-12-29 DIAGNOSIS — I739 Peripheral vascular disease, unspecified: Secondary | ICD-10-CM

## 2011-12-29 NOTE — Progress Notes (Signed)
[facility] [place] [facility] Office Note  CC: Bruising to right lower extremity Referring Physician: Kellie Simmering  History of Present Illness: 78 year old female patient of Dr. Kellie Simmering who is a significant peripheral vascular disease history with intervention. Most notable is a left femoral to perineal bypass graft in 2001. The patient also underwent a PTA of the left femoral bypass graft with Dr. Trula Slade in January 2013. The patient called the office yesterday asking to be seen due to to a "bruise" on the right lower extremity. We brought the patient in for evaluation. There is a small reddened area on the lateral right calf. The patient states she does not remember hitting her leg but noticed this Sunday morning when she was getting ready for church and was concerned that there may be a vascular problem.   Past Medical History  Diagnosis Date  . Diabetes mellitus   . Hypertension   . Leg pain   . Peripheral vascular disease   . Hyperlipidemia   . Hemorrhoids   . Carotid artery occlusion   . Ulcer   . COPD (chronic obstructive pulmonary disease)   . Cancer     Left Breast    ROS: [x]  Positive   [ ]  Denies    General: [ ]  Weight loss, [ ]  Fever, [ ]  chills Neurologic: [ ]  Dizziness, [ ]  Blackouts, [ ]  Seizure [ ]  Stroke, [ ]  "Mini stroke", [ ]  Slurred speech, [ ]  Temporary blindness; [ ]  weakness in arms or legs, [ ]  Hoarseness Cardiac: [ ]  Chest pain/pressure, [ ]  Shortness of breath at rest [ ]  Shortness of breath with exertion, [ ]  Atrial fibrillation or irregular heartbeat Vascular: [ ]  Pain in legs with walking, [ ]  Pain in legs at rest, [ ]  Pain in legs at night,  [ ]  Non-healing ulcer, [ ]  Blood clot in vein/DVT,   Pulmonary: [ ]  Home oxygen, [ ]  Productive cough, [ ]  Coughing up blood, [ ]  Asthma,  [ ]  Wheezing Musculoskeletal:  [ ]  Arthritis, [ ]  Low back pain, [ ]  Joint pain Hematologic: [ ]  Easy Bruising, [ ]  Anemia; [ ]  Hepatitis Gastrointestinal: [ ]   Blood in stool, [ ]  Gastroesophageal Reflux/heartburn, [ ]  Trouble swallowing Urinary: [ ]  chronic Kidney disease, [ ]  on HD - [ ]  MWF or [ ]  TTHS, [ ]  Burning with urination, [ ]  Difficulty urinating Skin: [ ]  Rashes, [ ]  Wounds Psychological: [ ]  Anxiety, [ ]  Depression   Social History History  Substance Use Topics  . Smoking status: Former Smoker -- 1.0 packs/day for 45 years    Types: Cigarettes    Quit date: 01/26/1997  . Smokeless tobacco: Not on file  . Alcohol Use: No    Family History Family History  Problem Relation Age of Onset  . Cancer Mother     stomach  . Cancer Father     liver  . Heart disease Sister     Allergies  Allergen Reactions  . Omnipaque (Iohexol) Other (See Comments)    unknown    Current Outpatient Prescriptions  Medication Sig Dispense Refill  . amLODipine (NORVASC) 10 MG tablet Take 10 mg by mouth daily.        . benazepril (LOTENSIN) 40 MG tablet Take 40 mg by mouth daily.        . calcium-vitamin D (OSCAL 500/200 D-3) 500-200 MG-UNIT per tablet Take 1 tablet by mouth 2 (two) times daily.        Marland Kitchen  carvedilol (COREG) 6.25 MG tablet Take 6.25 mg by mouth 2 (two) times daily with a meal.        . Ferrous Sulfate (IRON) 325 (65 FE) MG TABS Take 325 mg by mouth daily.       . fish oil-omega-3 fatty acids 1000 MG capsule Take 1 g by mouth 2 (two) times daily.        Marland Kitchen glipiZIDE (GLUCOTROL) 5 MG tablet Take 10 mg by mouth 2 (two) times daily before a meal.        . hydrochlorothiazide (HYDRODIURIL) 25 MG tablet Take 25 mg by mouth daily.        . mirtazapine (REMERON) 15 MG tablet Take 15 mg by mouth every morning.       Marland Kitchen omeprazole (PRILOSEC) 20 MG capsule Take 20 mg by mouth daily.        . pioglitazone (ACTOS) 45 MG tablet Take 45 mg by mouth daily.        . simvastatin (ZOCOR) 20 MG tablet Take 20 mg by mouth at bedtime.        . sitaGLIPtin (JANUVIA) 100 MG tablet Take 50 mg by mouth daily.          Physical Examination  Filed Vitals:     12/29/11 1043  BP: 186/58  Pulse: 57  Resp: 18    Body mass index is 32.27 kg/(m^2).  General:  WDWN in NAD Gait: Normal HEENT: WNL Eyes: Pupils equal Pulmonary: normal non-labored breathing , without Rales, rhonchi,  wheezing Cardiac: RRR, without  Murmurs, rubs or gallops; No carotid bruits Abdomen: soft, NT, no masses Skin: no rashes, ulcers noted Vascular Exam/Pulses: DP palpable bilaterally, there is a slight reddened area on the lateral aspect of the patient's right calf without obvious bruising or swelling  Extremities without ischemic changes, no Gangrene , no cellulitis; no open wounds;  Musculoskeletal: no muscle wasting or atrophy  Neurologic: A&O X 3; Appropriate Affect ; SENSATION: normal; MOTOR FUNCTION:  moving all extremities equally. Speech is fluent/normal  Non-Invasive Vascular Imaging: Lower extremity venous duplex shows no evidence of a thrombus, contralateral common femoral vein is patent and compressible, patent and compressible right lower extremity deep venous system  ASSESSMENT/PLAN: Very faint bruise to right lateral calf etiology unknown, no vascular issues identified with duplex today. The patient's related to hear she does not have any venous problems that are causing this, the patient will followup as scheduled with ABIs, her questions were encouraged and answered.  Beatris Ship ANP  Clinic M.D.: Kellie Simmering

## 2011-12-29 NOTE — Progress Notes (Signed)
Right lower extremity venous duplex performed @ [facility] 12/29/2011

## 2012-01-05 DIAGNOSIS — M79609 Pain in unspecified limb: Secondary | ICD-10-CM | POA: Diagnosis not present

## 2012-01-05 DIAGNOSIS — B351 Tinea unguium: Secondary | ICD-10-CM | POA: Diagnosis not present

## 2012-02-24 DIAGNOSIS — E1149 Type 2 diabetes mellitus with other diabetic neurological complication: Secondary | ICD-10-CM | POA: Diagnosis not present

## 2012-02-24 DIAGNOSIS — E1129 Type 2 diabetes mellitus with other diabetic kidney complication: Secondary | ICD-10-CM | POA: Diagnosis not present

## 2012-02-24 DIAGNOSIS — E7800 Pure hypercholesterolemia, unspecified: Secondary | ICD-10-CM | POA: Diagnosis not present

## 2012-02-24 DIAGNOSIS — A088 Other specified intestinal infections: Secondary | ICD-10-CM | POA: Diagnosis not present

## 2012-02-24 DIAGNOSIS — H9209 Otalgia, unspecified ear: Secondary | ICD-10-CM | POA: Diagnosis not present

## 2012-02-24 DIAGNOSIS — I10 Essential (primary) hypertension: Secondary | ICD-10-CM | POA: Diagnosis not present

## 2012-03-11 DIAGNOSIS — H35379 Puckering of macula, unspecified eye: Secondary | ICD-10-CM | POA: Diagnosis not present

## 2012-03-11 DIAGNOSIS — H26499 Other secondary cataract, unspecified eye: Secondary | ICD-10-CM | POA: Diagnosis not present

## 2012-03-11 DIAGNOSIS — H04129 Dry eye syndrome of unspecified lacrimal gland: Secondary | ICD-10-CM | POA: Diagnosis not present

## 2012-03-11 DIAGNOSIS — H3410 Central retinal artery occlusion, unspecified eye: Secondary | ICD-10-CM | POA: Diagnosis not present

## 2012-03-11 DIAGNOSIS — Z961 Presence of intraocular lens: Secondary | ICD-10-CM | POA: Diagnosis not present

## 2012-03-23 DIAGNOSIS — E1149 Type 2 diabetes mellitus with other diabetic neurological complication: Secondary | ICD-10-CM | POA: Diagnosis not present

## 2012-03-23 DIAGNOSIS — I10 Essential (primary) hypertension: Secondary | ICD-10-CM | POA: Diagnosis not present

## 2012-03-23 DIAGNOSIS — E669 Obesity, unspecified: Secondary | ICD-10-CM | POA: Diagnosis not present

## 2012-03-23 DIAGNOSIS — E7800 Pure hypercholesterolemia, unspecified: Secondary | ICD-10-CM | POA: Diagnosis not present

## 2012-03-23 DIAGNOSIS — G609 Hereditary and idiopathic neuropathy, unspecified: Secondary | ICD-10-CM | POA: Diagnosis not present

## 2012-03-23 DIAGNOSIS — I739 Peripheral vascular disease, unspecified: Secondary | ICD-10-CM | POA: Diagnosis not present

## 2012-03-23 DIAGNOSIS — E1129 Type 2 diabetes mellitus with other diabetic kidney complication: Secondary | ICD-10-CM | POA: Diagnosis not present

## 2012-04-05 DIAGNOSIS — B351 Tinea unguium: Secondary | ICD-10-CM | POA: Diagnosis not present

## 2012-04-05 DIAGNOSIS — M79609 Pain in unspecified limb: Secondary | ICD-10-CM | POA: Diagnosis not present

## 2012-04-11 ENCOUNTER — Encounter: Payer: Self-pay | Admitting: Neurosurgery

## 2012-04-12 ENCOUNTER — Other Ambulatory Visit (INDEPENDENT_AMBULATORY_CARE_PROVIDER_SITE_OTHER): Payer: Medicare Other | Admitting: *Deleted

## 2012-04-12 ENCOUNTER — Encounter: Payer: Self-pay | Admitting: Neurosurgery

## 2012-04-12 ENCOUNTER — Ambulatory Visit (INDEPENDENT_AMBULATORY_CARE_PROVIDER_SITE_OTHER): Payer: Medicare Other | Admitting: Neurosurgery

## 2012-04-12 ENCOUNTER — Encounter (INDEPENDENT_AMBULATORY_CARE_PROVIDER_SITE_OTHER): Payer: Medicare Other | Admitting: *Deleted

## 2012-04-12 VITALS — BP 140/60 | HR 56 | Resp 16 | Ht 67.0 in | Wt 200.0 lb

## 2012-04-12 DIAGNOSIS — I6529 Occlusion and stenosis of unspecified carotid artery: Secondary | ICD-10-CM

## 2012-04-12 DIAGNOSIS — I739 Peripheral vascular disease, unspecified: Secondary | ICD-10-CM

## 2012-04-12 DIAGNOSIS — Z48812 Encounter for surgical aftercare following surgery on the circulatory system: Secondary | ICD-10-CM

## 2012-04-12 NOTE — Progress Notes (Signed)
[facility] [place] [facility] Office Note  CC: PAD and. carotid surveillance Referring Physician: Kellie Simmering  History of Present Illness: 79 year old female patient of Dr. Kellie Simmering followed for known carotid stenosis as well as a past vascular history of multiple surgeries including an aortobifem bypass graft in 1998 and 2001 a left profunda femoris to peroneal bypass graft using lesser saphenous vein with multiple revisions of the distal anastomosis. The patient states she does have Tranxene claudication and rest pain but this is chronic and she states it is no worse than it has been. The patient denies any signs or symptoms of CVA, TIA, amaurosis fugax or any neural deficit.  Past Medical History  Diagnosis Date  . Diabetes mellitus   . Hypertension   . Leg pain   . Peripheral vascular disease   . Hyperlipidemia   . Hemorrhoids   . Carotid artery occlusion   . Ulcer   . COPD (chronic obstructive pulmonary disease)   . Chronic kidney disease   . Cancer 1994    Left Breast    ROS: [x]  Positive   [ ]  Denies    General: [ ]  Weight loss, [ ]  Fever, [ ]  chills Neurologic: [ ]  Dizziness, [ ]  Blackouts, [ ]  Seizure [ ]  Stroke, [ ]  "Mini stroke", [ ]  Slurred speech, [ ]  Temporary blindness; [ ]  weakness in arms or legs, [ ]  Hoarseness Cardiac: [ ]  Chest pain/pressure, [ ]  Shortness of breath at rest [ ]  Shortness of breath with exertion, [ ]  Atrial fibrillation or irregular heartbeat Vascular: [ ]  Pain in legs with walking, [ ]  Pain in legs at rest, [ ]  Pain in legs at night,  [ ]  Non-healing ulcer, [ ]  Blood clot in vein/DVT,   Pulmonary: [ ]  Home oxygen, [ ]  Productive cough, [ ]  Coughing up blood, [ ]  Asthma,  [ ]  Wheezing Musculoskeletal:  [ ]  Arthritis, [ ]  Low back pain, [ ]  Joint pain Hematologic: [ ]  Easy Bruising, [ ]  Anemia; [ ]  Hepatitis Gastrointestinal: [ ]  Blood in stool, [ ]  Gastroesophageal Reflux/heartburn, [ ]  Trouble swallowing Urinary: [ ]  chronic  Kidney disease, [ ]  on HD - [ ]  MWF or [ ]  TTHS, [ ]  Burning with urination, [ ]  Difficulty urinating Skin: [ ]  Rashes, [ ]  Wounds Psychological: [ ]  Anxiety, [ ]  Depression   Social History History  Substance Use Topics  . Smoking status: Former Smoker -- 1.0 packs/day for 45 years    Types: Cigarettes    Quit date: 01/26/1997  . Smokeless tobacco: Never Used  . Alcohol Use: No    Family History Family History  Problem Relation Age of Onset  . Cancer Mother     stomach  . Cancer Father     liver  . Heart disease Sister     Allergies  Allergen Reactions  . Omnipaque (Iohexol) Other (See Comments)    unknown    Current Outpatient Prescriptions  Medication Sig Dispense Refill  . amLODipine (NORVASC) 10 MG tablet Take 10 mg by mouth daily.        . benazepril (LOTENSIN) 40 MG tablet Take 40 mg by mouth daily.        . calcium-vitamin D (OSCAL 500/200 D-3) 500-200 MG-UNIT per tablet Take 1 tablet by mouth 2 (two) times daily.        . carvedilol (COREG) 6.25 MG tablet Take 6.25 mg by mouth 2 (two) times daily with a meal.        .  Ferrous Sulfate (IRON) 325 (65 FE) MG TABS Take 325 mg by mouth daily.       . fish oil-omega-3 fatty acids 1000 MG capsule Take 1 g by mouth 2 (two) times daily.        Marland Kitchen glipiZIDE (GLUCOTROL) 5 MG tablet Take 10 mg by mouth 2 (two) times daily before a meal.        . hydrochlorothiazide (HYDRODIURIL) 25 MG tablet Take 25 mg by mouth daily.        . mirtazapine (REMERON) 15 MG tablet Take 15 mg by mouth every morning.       Marland Kitchen omeprazole (PRILOSEC) 20 MG capsule Take 20 mg by mouth daily.        . pioglitazone (ACTOS) 45 MG tablet Take 45 mg by mouth daily.        . simvastatin (ZOCOR) 20 MG tablet Take 20 mg by mouth at bedtime.        . sitaGLIPtin (JANUVIA) 100 MG tablet Take 50 mg by mouth daily.          Physical Examination  Filed Vitals:   04/12/12 1530  BP: 140/60  Pulse: 56  Resp: 16    Body mass index is 31.32  kg/(m^2).  General:  WDWN in NAD Gait: Normal HEENT: WNL Eyes: Pupils equal Pulmonary: normal non-labored breathing , without Rales, rhonchi,  wheezing Cardiac: RRR, without  Murmurs, rubs or gallops; No carotid bruits Abdomen: soft, NT, no masses Skin: no rashes, ulcers noted Vascular Exam/Pulses: Palpable femoral pulses however lower extremity pulses are not palpable, carotid pulses to auscultation no bruits are heard  Extremities without ischemic changes, no Gangrene , no cellulitis; no open wounds;  Musculoskeletal: no muscle wasting or atrophy  Neurologic: A&O X 3; Appropriate Affect ; SENSATION: normal; MOTOR FUNCTION:  moving all extremities equally. Speech is fluent/normal  Non-Invasive Vascular Imaging: ABIs today are 0.6 to monophasic on the right, 0.9 and monophasic on the left with a patent left lower extremity bypass with some elevation in velocity at the proximal anastomosis of 331 cm a second today which is consistent with previous exam in July 2013. This was reviewed with Dr. Kellie Simmering who recommends continued surveillance.  Carotid duplex today shows less than 40% ICA stenosis bilaterally  ASSESSMENT/PLAN: Patient with chronic claudication and rest pain that tolerates this well, she is asymptomatic regarding her carotids. Per Dr. Kellie Simmering we will restudy her ABIs and left lower extremity bypass graft in 6 months, we will repeat her carotid duplex in one year. The patient's questions were encouraged and answered, she is in agreement with this plan.  Beatris Ship ANP  Clinic M.D.: Kellie Simmering

## 2012-04-13 ENCOUNTER — Other Ambulatory Visit: Payer: Self-pay | Admitting: *Deleted

## 2012-04-13 DIAGNOSIS — I6529 Occlusion and stenosis of unspecified carotid artery: Secondary | ICD-10-CM

## 2012-04-13 DIAGNOSIS — Z48812 Encounter for surgical aftercare following surgery on the circulatory system: Secondary | ICD-10-CM

## 2012-04-13 DIAGNOSIS — I739 Peripheral vascular disease, unspecified: Secondary | ICD-10-CM

## 2012-06-22 DIAGNOSIS — H6120 Impacted cerumen, unspecified ear: Secondary | ICD-10-CM | POA: Diagnosis not present

## 2012-06-22 DIAGNOSIS — R42 Dizziness and giddiness: Secondary | ICD-10-CM | POA: Diagnosis not present

## 2012-06-22 DIAGNOSIS — E1129 Type 2 diabetes mellitus with other diabetic kidney complication: Secondary | ICD-10-CM | POA: Diagnosis not present

## 2012-06-22 DIAGNOSIS — I10 Essential (primary) hypertension: Secondary | ICD-10-CM | POA: Diagnosis not present

## 2012-06-22 DIAGNOSIS — I498 Other specified cardiac arrhythmias: Secondary | ICD-10-CM | POA: Diagnosis not present

## 2012-06-22 DIAGNOSIS — E669 Obesity, unspecified: Secondary | ICD-10-CM | POA: Diagnosis not present

## 2012-06-23 DIAGNOSIS — I498 Other specified cardiac arrhythmias: Secondary | ICD-10-CM | POA: Diagnosis not present

## 2012-06-23 DIAGNOSIS — I10 Essential (primary) hypertension: Secondary | ICD-10-CM | POA: Diagnosis not present

## 2012-06-28 DIAGNOSIS — E1149 Type 2 diabetes mellitus with other diabetic neurological complication: Secondary | ICD-10-CM | POA: Diagnosis not present

## 2012-06-28 DIAGNOSIS — M79609 Pain in unspecified limb: Secondary | ICD-10-CM | POA: Diagnosis not present

## 2012-06-28 DIAGNOSIS — B351 Tinea unguium: Secondary | ICD-10-CM | POA: Diagnosis not present

## 2012-06-30 DIAGNOSIS — I10 Essential (primary) hypertension: Secondary | ICD-10-CM | POA: Diagnosis not present

## 2012-06-30 DIAGNOSIS — I498 Other specified cardiac arrhythmias: Secondary | ICD-10-CM | POA: Diagnosis not present

## 2012-08-05 DIAGNOSIS — Z853 Personal history of malignant neoplasm of breast: Secondary | ICD-10-CM | POA: Diagnosis not present

## 2012-08-05 DIAGNOSIS — Z1231 Encounter for screening mammogram for malignant neoplasm of breast: Secondary | ICD-10-CM | POA: Diagnosis not present

## 2012-08-12 DIAGNOSIS — E669 Obesity, unspecified: Secondary | ICD-10-CM | POA: Diagnosis not present

## 2012-08-12 DIAGNOSIS — I4892 Unspecified atrial flutter: Secondary | ICD-10-CM | POA: Diagnosis not present

## 2012-08-12 DIAGNOSIS — I10 Essential (primary) hypertension: Secondary | ICD-10-CM | POA: Diagnosis not present

## 2012-08-12 DIAGNOSIS — I495 Sick sinus syndrome: Secondary | ICD-10-CM | POA: Diagnosis not present

## 2012-09-22 DIAGNOSIS — I4892 Unspecified atrial flutter: Secondary | ICD-10-CM | POA: Diagnosis not present

## 2012-09-22 DIAGNOSIS — R0602 Shortness of breath: Secondary | ICD-10-CM | POA: Diagnosis not present

## 2012-09-22 DIAGNOSIS — I10 Essential (primary) hypertension: Secondary | ICD-10-CM | POA: Diagnosis not present

## 2012-09-22 DIAGNOSIS — R21 Rash and other nonspecific skin eruption: Secondary | ICD-10-CM | POA: Diagnosis not present

## 2012-09-22 DIAGNOSIS — I495 Sick sinus syndrome: Secondary | ICD-10-CM | POA: Diagnosis not present

## 2012-09-27 DIAGNOSIS — L608 Other nail disorders: Secondary | ICD-10-CM | POA: Diagnosis not present

## 2012-09-27 DIAGNOSIS — Q828 Other specified congenital malformations of skin: Secondary | ICD-10-CM | POA: Diagnosis not present

## 2012-09-27 DIAGNOSIS — E1149 Type 2 diabetes mellitus with other diabetic neurological complication: Secondary | ICD-10-CM | POA: Diagnosis not present

## 2012-09-29 DIAGNOSIS — L259 Unspecified contact dermatitis, unspecified cause: Secondary | ICD-10-CM | POA: Diagnosis not present

## 2012-10-05 DIAGNOSIS — I498 Other specified cardiac arrhythmias: Secondary | ICD-10-CM | POA: Diagnosis not present

## 2012-10-05 DIAGNOSIS — E1149 Type 2 diabetes mellitus with other diabetic neurological complication: Secondary | ICD-10-CM | POA: Diagnosis not present

## 2012-10-05 DIAGNOSIS — E1129 Type 2 diabetes mellitus with other diabetic kidney complication: Secondary | ICD-10-CM | POA: Diagnosis not present

## 2012-10-05 DIAGNOSIS — L299 Pruritus, unspecified: Secondary | ICD-10-CM | POA: Diagnosis not present

## 2012-10-05 DIAGNOSIS — D649 Anemia, unspecified: Secondary | ICD-10-CM | POA: Diagnosis not present

## 2012-10-05 DIAGNOSIS — I10 Essential (primary) hypertension: Secondary | ICD-10-CM | POA: Diagnosis not present

## 2012-10-05 DIAGNOSIS — G609 Hereditary and idiopathic neuropathy, unspecified: Secondary | ICD-10-CM | POA: Diagnosis not present

## 2012-10-05 DIAGNOSIS — D692 Other nonthrombocytopenic purpura: Secondary | ICD-10-CM | POA: Diagnosis not present

## 2012-10-07 DIAGNOSIS — D649 Anemia, unspecified: Secondary | ICD-10-CM | POA: Diagnosis not present

## 2012-10-07 DIAGNOSIS — E039 Hypothyroidism, unspecified: Secondary | ICD-10-CM | POA: Diagnosis not present

## 2012-10-11 ENCOUNTER — Encounter (INDEPENDENT_AMBULATORY_CARE_PROVIDER_SITE_OTHER): Payer: Medicare Other | Admitting: *Deleted

## 2012-10-11 ENCOUNTER — Telehealth: Payer: Self-pay | Admitting: Hematology and Oncology

## 2012-10-11 ENCOUNTER — Ambulatory Visit: Payer: Medicare Other | Admitting: Neurosurgery

## 2012-10-11 DIAGNOSIS — I739 Peripheral vascular disease, unspecified: Secondary | ICD-10-CM | POA: Diagnosis not present

## 2012-10-11 DIAGNOSIS — Z48812 Encounter for surgical aftercare following surgery on the circulatory system: Secondary | ICD-10-CM | POA: Diagnosis not present

## 2012-10-11 NOTE — Telephone Encounter (Signed)
S/W PT IN RE NP APPT 08/25 @ 10:30 W/DR. [place] Angelina Pih DX-ANEMIA;THROMBOCYTOPENIA WELCOME PACKET MAILED.   REFERRING OFFICE IS AWARE OF APPT.

## 2012-10-11 NOTE — Telephone Encounter (Signed)
LVOM FOR PT TO RETURN CALL IN RE NP APPT.  °

## 2012-10-12 ENCOUNTER — Telehealth: Payer: Self-pay

## 2012-10-12 NOTE — Telephone Encounter (Signed)
C/D 10/12/12 for appt. 10/31/12

## 2012-10-13 ENCOUNTER — Encounter: Payer: Self-pay | Admitting: Vascular Surgery

## 2012-10-13 ENCOUNTER — Other Ambulatory Visit: Payer: Self-pay | Admitting: *Deleted

## 2012-10-13 DIAGNOSIS — I739 Peripheral vascular disease, unspecified: Secondary | ICD-10-CM

## 2012-10-13 DIAGNOSIS — Z48812 Encounter for surgical aftercare following surgery on the circulatory system: Secondary | ICD-10-CM

## 2012-10-18 DIAGNOSIS — I10 Essential (primary) hypertension: Secondary | ICD-10-CM | POA: Diagnosis not present

## 2012-10-18 DIAGNOSIS — I495 Sick sinus syndrome: Secondary | ICD-10-CM | POA: Diagnosis not present

## 2012-10-18 DIAGNOSIS — D500 Iron deficiency anemia secondary to blood loss (chronic): Secondary | ICD-10-CM | POA: Diagnosis not present

## 2012-10-18 DIAGNOSIS — E039 Hypothyroidism, unspecified: Secondary | ICD-10-CM | POA: Diagnosis not present

## 2012-10-31 ENCOUNTER — Ambulatory Visit (HOSPITAL_BASED_OUTPATIENT_CLINIC_OR_DEPARTMENT_OTHER): Payer: Medicare Other | Admitting: Lab

## 2012-10-31 ENCOUNTER — Telehealth: Payer: Self-pay | Admitting: Hematology and Oncology

## 2012-10-31 ENCOUNTER — Ambulatory Visit (HOSPITAL_BASED_OUTPATIENT_CLINIC_OR_DEPARTMENT_OTHER): Payer: Medicare Other | Admitting: Hematology and Oncology

## 2012-10-31 ENCOUNTER — Encounter: Payer: Self-pay | Admitting: Hematology and Oncology

## 2012-10-31 ENCOUNTER — Ambulatory Visit: Payer: Medicare Other

## 2012-10-31 VITALS — BP 162/54 | HR 60 | Temp 97.0°F | Resp 20 | Ht 67.0 in | Wt 204.2 lb

## 2012-10-31 DIAGNOSIS — J449 Chronic obstructive pulmonary disease, unspecified: Secondary | ICD-10-CM | POA: Diagnosis not present

## 2012-10-31 DIAGNOSIS — N189 Chronic kidney disease, unspecified: Secondary | ICD-10-CM

## 2012-10-31 DIAGNOSIS — R58 Hemorrhage, not elsewhere classified: Secondary | ICD-10-CM

## 2012-10-31 DIAGNOSIS — I998 Other disorder of circulatory system: Secondary | ICD-10-CM | POA: Diagnosis not present

## 2012-10-31 DIAGNOSIS — D649 Anemia, unspecified: Secondary | ICD-10-CM | POA: Diagnosis not present

## 2012-10-31 DIAGNOSIS — D696 Thrombocytopenia, unspecified: Secondary | ICD-10-CM | POA: Diagnosis not present

## 2012-10-31 DIAGNOSIS — J4489 Other specified chronic obstructive pulmonary disease: Secondary | ICD-10-CM

## 2012-10-31 LAB — CBC & DIFF AND RETIC
BASO%: 0.3 % (ref 0.0–2.0)
EOS%: 5.8 % (ref 0.0–7.0)
Immature Retic Fract: 12.2 % — ABNORMAL HIGH (ref 1.60–10.00)
MCH: 30.7 pg (ref 25.1–34.0)
MCHC: 32.3 g/dL (ref 31.5–36.0)
RDW: 14.4 % (ref 11.2–14.5)
Retic Ct Abs: 71.6 10*3/uL (ref 33.70–90.70)
lymph#: 1 10*3/uL (ref 0.9–3.3)

## 2012-10-31 NOTE — Telephone Encounter (Signed)
[facility] AD[facility] PRINTED APPT SCHED AND AVS FOR PT...SENT PT BACK TO THE LAB

## 2012-10-31 NOTE — Progress Notes (Signed)
ID: Jackie Alvarez OB: 1931/08/09  MR#: [id number]  [place]  [phone] [phone] [place]  PCP: Hulen Shouts, MD  Reason for Referral: Anemia, Thrombocytopenia.   HISTORY OF PRESENT ILLNESS: Patient is a 79 y.o. Female who was sent for evaluation of anemia and thrombocytopenia. She had long time history of anemia. Her Hgb was 9.8 on 03/17/2007; 11.7 on 02/13/2009; 12.7 on 03/07/2009; 11.6 on 03/17/2011; 12.4 on 06/22/2012; 10.3 on 10/05/2012 her platelets count was normal until 10/05/2012 when it was 127,000. The patient was send for evaluation to hematology clinic.She reported feeling cold and be dyspneic: not able to take deep breath.. Occasionally she has cough. She complaints on clear nasal discharge in morning. Patient hearing is diminished on right side. Her appetite is fine and she gain weight. Occasionally she has diarrhea. She is bruising easily. The patient denied fever, chills, night sweats.  She denied headaches, double vision, blurry vision, nasal congestion,  odynophagia or dysphagia. No chest pain, palpitations,  abdominal pain, nausea, vomiting, constipation, hematochezia. The patient denied dysuria, nocturia, polyuria, hematuria, myalgia, numbness, tingling, psychiatric problems.  Review of Systems  Constitutional: Negative for fever, chills, weight loss, malaise/fatigue and diaphoresis.  HENT: Positive for hearing loss. Negative for ear pain, nosebleeds, congestion, sore throat, neck pain, tinnitus and ear discharge.   Eyes: Negative for blurred vision, double vision, photophobia and pain.  Respiratory: Positive for cough and shortness of breath. Negative for hemoptysis, sputum production and wheezing.   Cardiovascular: Negative for chest pain, palpitations, orthopnea, claudication, leg swelling and PND.  Gastrointestinal: Positive for diarrhea. Negative for heartburn, nausea, vomiting,  constipation, blood in stool and melena.  Genitourinary: Negative for dysuria, urgency, frequency and hematuria.  Musculoskeletal: Negative for myalgias, back pain and joint pain.  Skin: Negative for itching and rash.  Neurological: Negative for dizziness, tingling, tremors, sensory change, speech change, focal weakness, seizures, loss of consciousness, weakness and headaches.  Endo/Heme/Allergies: Bruises/bleeds easily.  Psychiatric/Behavioral: Negative.      PAST MEDICAL HISTORY: Past Medical History  Diagnosis Date  . Diabetes mellitus   . Hypertension   . Leg pain   . Peripheral vascular disease   . Hyperlipidemia   . Hemorrhoids   . Carotid artery occlusion   . Ulcer   . COPD (chronic obstructive pulmonary disease)   . Chronic kidney disease   . Cancer 1994    Left Breast    PAST SURGICAL HISTORY: Past Surgical History  Procedure Laterality Date  . Wrist surgery  02/2009  . Cholecystectomy      Gall Bladder  . Pr vein bypass graft,aorto-fem-pop    . Abdominal hysterectomy    . Carotid endarterectomy    . Breast lumpectomy    . Abdominal aortagram  Jan. 18, 2013    FAMILY HISTORY Family History  Problem Relation Age of Onset  . Cancer Mother     stomach  . Cancer Father     liver  . Heart disease Sister     HEALTH MAINTENANCE: History  Substance Use Topics  . Smoking status: Former Smoker -- 1.00 packs/day for 45 years    Types: Cigarettes    Quit date: 01/26/1997  . Smokeless tobacco: Never Used  . Alcohol Use: No    Allergies  Allergen Reactions  . Omnipaque [Iohexol] Nausea And Vomiting    unknown    Current Outpatient Prescriptions  Medication Sig Dispense Refill  . amLODipine (NORVASC) 10 MG tablet Take  10 mg by mouth daily.        . benazepril (LOTENSIN) 40 MG tablet Take 40 mg by mouth daily.        . calcium-vitamin D (OSCAL 500/200 D-3) 500-200 MG-UNIT per tablet Take 1 tablet by mouth 2 (two) times daily.        . carvedilol (COREG)  6.25 MG tablet Take 6.25 mg by mouth 2 (two) times daily with a meal.        . doxazosin (CARDURA) 2 MG tablet Take 6 mg by mouth at bedtime.      . Ferrous Sulfate (IRON) 325 (65 FE) MG TABS Take 325 mg by mouth daily.       . fish oil-omega-3 fatty acids 1000 MG capsule Take 1 g by mouth 2 (two) times daily.        Marland Kitchen glipiZIDE (GLUCOTROL) 5 MG tablet Take 10 mg by mouth 2 (two) times daily before a meal.        . hydrochlorothiazide (HYDRODIURIL) 25 MG tablet Take 25 mg by mouth daily.        Marland Kitchen levothyroxine (SYNTHROID, LEVOTHROID) 25 MCG tablet Take 25 mcg by mouth daily.      . mirtazapine (REMERON) 15 MG tablet Take 15 mg by mouth every morning.       Marland Kitchen omeprazole (PRILOSEC) 20 MG capsule Take 20 mg by mouth daily.        . pioglitazone (ACTOS) 45 MG tablet Take 45 mg by mouth daily.        . simvastatin (ZOCOR) 20 MG tablet Take 20 mg by mouth at bedtime.        . sitaGLIPtin (JANUVIA) 100 MG tablet Take 50 mg by mouth daily.         No current facility-administered medications for this visit.    OBJECTIVE: Filed Vitals:   10/31/12 1045  BP: 162/54  Pulse: 60  Temp: 97 F (36.1 C)  Resp: 20     Body mass index is 31.97 kg/(m^2).      PHYSICAL EXAMINATION:  HEENT: Sclerae anicteric.  Conjunctivae were pink. Pupils round and reactive bilaterally. Oral mucosa is moist without ulceration or thrush. No occipital, submandibular, cervical, supraclavicular or axillar adenopathy. Lungs: clear to auscultation without wheezes. No rales or rhonchi. Heart: regular rate and rhythm. No murmur, gallop or rubs. Abdomen: soft, non tender. No guarding or rebound tenderness. Bowel sounds are present. No palpable hepatosplenomegaly. MSK: no focal spinal tenderness. Extremities: No clubbing or cyanosis.No calf tenderness to palpitation, no peripheral edema. The patient had grossly intact strength in upper and lower extremities. Skin exam was without ecchymosis, petechiae. Neuro: non-focal, alert  and oriented to time, person and place, appropriate affect  LAB RESULTS:  CMP     Component Value Date/Time   NA 140 03/17/2011 0652   K 4.3 03/17/2011 0652   CL 108 03/17/2011 0652   CO2 23 03/07/2009 1323   GLUCOSE 195* 03/17/2011 0652   BUN 37* 03/17/2011 0652   CREATININE 1.40* 03/17/2011 0652   CALCIUM 8.8 03/07/2009 1323   PROT 6.1 03/15/2007 0915   ALBUMIN 3.6 03/15/2007 0915   AST 20 03/15/2007 0915   ALT 15 03/15/2007 0915   ALKPHOS 78 03/15/2007 0915   BILITOT 0.6 03/15/2007 0915   GFRNONAA 39* 03/07/2009 1323   GFRAA  Value: 47        The eGFR has been calculated using the MDRD equation. This calculation has not been validated in all clinical situations.  eGFR's persistently <60 mL/min signify possible Chronic Kidney Disease.* 03/07/2009 1323    I No results found for this basename: SPEP, UPEP,  kappa and lambda light chains    Lab Results  Component Value Date   WBC 7.4 03/07/2009   NEUTROABS 6.0 02/11/2009   HGB 11.6* 03/17/2011   HCT 34.0* 03/17/2011   MCV 93.8 03/07/2009   PLT 218 03/07/2009      Chemistry      Component Value Date/Time   NA 140 03/17/2011 0652   K 4.3 03/17/2011 0652   CL 108 03/17/2011 0652   CO2 23 03/07/2009 1323   BUN 37* 03/17/2011 0652   CREATININE 1.40* 03/17/2011 0652      Component Value Date/Time   CALCIUM 8.8 03/07/2009 1323   ALKPHOS 78 03/15/2007 0915   AST 20 03/15/2007 0915   ALT 15 03/15/2007 0915   BILITOT 0.6 03/15/2007 0915     10/18/2012  Ferritin 127 Iron 91; TIBC 434 ; % sat 31.  STUDIES: No results found.  ASSESSMENT AND PLAN: 1.. Anemia 2. Thrombocytopenia 3. Easy bruising. We will star initial work up for and order: CBC with retic count; morphology (peripheral smear. work up for Multiple myeloma; haptoglobin, DAT, von Willebrand panel, PT, aPTT Possibility to do bone marrow biopsy and aspiration  was discussed. I will follow patient in 1-2 [place]  Conni Slipper, MD   10/31/2012 11:42 AM

## 2012-10-31 NOTE — Progress Notes (Signed)
Checked in new pt with no financial concerns. °

## 2012-11-02 DIAGNOSIS — D649 Anemia, unspecified: Secondary | ICD-10-CM | POA: Diagnosis not present

## 2012-11-02 LAB — SPEP & IFE WITH QIG
Beta Globulin: 7 % (ref 4.7–7.2)
IgA: 98 mg/dL (ref 69–380)
IgG (Immunoglobin G), Serum: 582 mg/dL — ABNORMAL LOW (ref 690–1700)
Total Protein, Serum Electrophoresis: 6.4 g/dL (ref 6.0–8.3)

## 2012-11-02 LAB — PROTHROMBIN TIME: INR: 1.15 (ref <1.50)

## 2012-11-02 LAB — APTT: aPTT: 36 seconds (ref 24–37)

## 2012-11-02 LAB — VON WILLEBRAND PANEL
Coagulation Factor VIII: 167 % — ABNORMAL HIGH (ref 73–140)
Ristocetin Co-factor, Plasma: 119 % (ref 42–200)

## 2012-11-04 LAB — UIFE/LIGHT CHAINS/TP QN, 24-HR UR
Alpha 1, Urine: DETECTED — AB
Free Kappa Lt Chains,Ur: 2.88 mg/dL — ABNORMAL HIGH (ref 0.14–2.42)
Free Lambda Excretion/Day: 4.8 mg/d
Gamma Globulin, Urine: DETECTED — AB
Time: 24 hours
Volume, Urine: 800 mL

## 2012-11-14 ENCOUNTER — Ambulatory Visit (HOSPITAL_BASED_OUTPATIENT_CLINIC_OR_DEPARTMENT_OTHER): Payer: Medicare Other | Admitting: Lab

## 2012-11-14 ENCOUNTER — Ambulatory Visit (HOSPITAL_COMMUNITY)
Admission: RE | Admit: 2012-11-14 | Discharge: 2012-11-14 | Disposition: A | Discharge status: Home / Self Care | Payer: Medicare Other | Source: Ambulatory Visit | Attending: Hematology and Oncology | Admitting: Hematology and Oncology

## 2012-11-14 ENCOUNTER — Ambulatory Visit (HOSPITAL_BASED_OUTPATIENT_CLINIC_OR_DEPARTMENT_OTHER): Payer: Medicare Other | Admitting: Hematology and Oncology

## 2012-11-14 ENCOUNTER — Encounter (HOSPITAL_COMMUNITY): Payer: Self-pay

## 2012-11-14 VITALS — BP 161/55 | HR 68 | Temp 97.6°F | Resp 17 | Ht 67.0 in | Wt 206.3 lb

## 2012-11-14 DIAGNOSIS — D649 Anemia, unspecified: Secondary | ICD-10-CM | POA: Diagnosis not present

## 2012-11-14 DIAGNOSIS — J90 Pleural effusion, not elsewhere classified: Secondary | ICD-10-CM | POA: Insufficient documentation

## 2012-11-14 DIAGNOSIS — R0600 Dyspnea, unspecified: Secondary | ICD-10-CM

## 2012-11-14 DIAGNOSIS — D696 Thrombocytopenia, unspecified: Secondary | ICD-10-CM

## 2012-11-14 DIAGNOSIS — R918 Other nonspecific abnormal finding of lung field: Secondary | ICD-10-CM | POA: Diagnosis not present

## 2012-11-14 DIAGNOSIS — R0609 Other forms of dyspnea: Secondary | ICD-10-CM

## 2012-11-14 DIAGNOSIS — J438 Other emphysema: Secondary | ICD-10-CM | POA: Diagnosis not present

## 2012-11-14 DIAGNOSIS — R0989 Other specified symptoms and signs involving the circulatory and respiratory systems: Secondary | ICD-10-CM | POA: Diagnosis not present

## 2012-11-14 LAB — CBC & DIFF AND RETIC
Basophils Absolute: 0 10*3/uL (ref 0.0–0.1)
EOS%: 4 % (ref 0.0–7.0)
Eosinophils Absolute: 0.2 10*3/uL (ref 0.0–0.5)
HGB: 9.3 g/dL — ABNORMAL LOW (ref 11.6–15.9)
LYMPH%: 21.1 % (ref 14.0–49.7)
MCH: 31.2 pg (ref 25.1–34.0)
MCV: 96 fL (ref 79.5–101.0)
MONO%: 4.4 % (ref 0.0–14.0)
NEUT#: 3.2 10*3/uL (ref 1.5–6.5)
Platelets: 121 10*3/uL — ABNORMAL LOW (ref 145–400)
RDW: 15 % — ABNORMAL HIGH (ref 11.2–14.5)

## 2012-11-14 LAB — COMPREHENSIVE METABOLIC PANEL (CC13)
CO2: 30 mEq/L — ABNORMAL HIGH (ref 22–29)
Creatinine: 1.7 mg/dL — ABNORMAL HIGH (ref 0.6–1.1)
Glucose: 134 mg/dl (ref 70–140)
Sodium: 149 mEq/L — ABNORMAL HIGH (ref 136–145)
Total Bilirubin: 0.35 mg/dL (ref 0.20–1.20)
Total Protein: 6.6 g/dL (ref 6.4–8.3)

## 2012-11-14 MED ORDER — IOHEXOL 350 MG/ML SOLN
100.0000 mL | Freq: Once | INTRAVENOUS | Status: AC | PRN
  Administered 2012-11-14: 80 mL via INTRAVENOUS

## 2012-11-15 ENCOUNTER — Telehealth: Payer: Self-pay | Admitting: *Deleted

## 2012-11-15 NOTE — Telephone Encounter (Signed)
sw pt [facility] appt for 11/21/12 @ 1pm... Pt is aware...td

## 2012-11-16 DIAGNOSIS — D649 Anemia, unspecified: Secondary | ICD-10-CM | POA: Diagnosis not present

## 2012-11-16 LAB — HEAVY METALS, BLOOD
Arsenic: <3 mcg/L (ref <23)
Lead: <2 ug/dL (ref <10)

## 2012-11-16 LAB — CERULOPLASMIN: Ceruloplasmin: 28 mg/dL (ref 20–60)

## 2012-11-16 LAB — COPPER, SERUM: Copper: 90 ug/dL (ref 70–175)

## 2012-11-16 NOTE — Progress Notes (Signed)
ID: Jackie Alvarez OB: [birth year]  MR#: [id number]  [place]  [phone] [phone]   OFFICE PROGRESS NOTE  PCP: Hulen Shouts, MD   DIAGNOSIS: : Anemia, Thrombocytopenia.   HISTORY OF PRESENT ILLNESS:  Patient is a 79 y.o. Female who was sent for evaluation of anemia and thrombocytopenia. She had long time history of anemia. Her Hgb was 9.8 on 03/17/2007; 11.7 on 02/13/2009; 12.7 on 03/07/2009; 11.6 on 03/17/2011; 12.4 on 06/22/2012; 10.3 on 10/05/2012 her platelets count was normal until 10/05/2012 when it was 127,000. The patient was send for evaluation to hematology clinic.lems. I order initial work up: CBC with retic count; morphology (peripheral smear. work up for Multiple myeloma; haptoglobin, DAT, von Willebrand panel, PT, aPTT  Possibility to do bone marrow biopsy and aspiration was discussed.  INTERVAL HISTORY: Patient is a 79 y.o. Female who presented for follow up visit. She does not feels well. She reported significant dyspnea and feels when she takes deep breath it is still not enough air. She has clear nasal discharge and diminished heating on right. The patient denied fever, chills, night sweats, change in appetite or weight. She denied headaches, double vision, blurry vision, nasal congestion, odynophagia or dysphagia. No chest pain, palpitations, cough, abdominal pain, nausea, vomiting, diarrhea, constipation, hematochezia. The patient denied dysuria, nocturia, polyuria, hematuria, myalgia, numbness, tingling, psychiatric problems.  Review of Systems  Constitutional: Negative for fever, chills, weight loss, malaise/fatigue and diaphoresis.  HENT: Positive for hearing loss. Negative for ear pain, nosebleeds, congestion, sore throat, neck pain and tinnitus.   Eyes: Negative for blurred vision, double vision and photophobia.  Respiratory: Positive for shortness of breath. Negative for cough, hemoptysis, sputum production and  wheezing.   Cardiovascular: Negative for chest pain, palpitations, orthopnea and claudication.  Gastrointestinal: Negative for heartburn, nausea, vomiting, abdominal pain, diarrhea, constipation, blood in stool and melena.  Genitourinary: Negative for dysuria, urgency, frequency and hematuria.  Musculoskeletal: Negative for myalgias, back pain and joint pain.  Skin: Negative for itching and rash.  Neurological: Negative for dizziness, tingling, tremors, sensory change, speech change, focal weakness, seizures, loss of consciousness, weakness and headaches.  Endo/Heme/Allergies: Does not bruise/bleed easily.  Psychiatric/Behavioral: Negative.     PAST MEDICAL HISTORY: Past Medical History  Diagnosis Date  . Diabetes mellitus   . Hypertension   . Leg pain   . Peripheral vascular disease   . Hyperlipidemia   . Hemorrhoids   . Carotid artery occlusion   . Ulcer   . COPD (chronic obstructive pulmonary disease)   . Chronic kidney disease   . Cancer 1994    Left Breast    PAST SURGICAL HISTORY: Past Surgical History  Procedure Laterality Date  . Wrist surgery  02/2009  . Cholecystectomy      Gall Bladder  . Pr vein bypass graft,aorto-fem-pop    . Abdominal hysterectomy    . Carotid endarterectomy    . Breast lumpectomy    . Abdominal aortagram  Jan. 18, 2013    FAMILY HISTORY Family History  Problem Relation Age of Onset  . Cancer Mother     stomach  . Cancer Father     liver  . Heart disease Sister    HEALTH MAINTENANCE: History  Substance Use Topics  . Smoking status: Former Smoker -- 1.00 packs/day for 45 years    Types: Cigarettes    Quit date: 01/26/1997  . Smokeless tobacco: Never Used  . Alcohol Use: No    Allergies  Allergen Reactions  . Omnipaque [Iohexol] Nausea And Vomiting    unknown    Current Outpatient Prescriptions  Medication Sig Dispense Refill  . amLODipine (NORVASC) 10 MG tablet Take 10 mg by mouth daily.        . benazepril (LOTENSIN)  40 MG tablet Take 40 mg by mouth daily.        . calcium-vitamin D (OSCAL 500/200 D-3) 500-200 MG-UNIT per tablet Take 1 tablet by mouth 2 (two) times daily.        . carvedilol (COREG) 6.25 MG tablet Take 6.25 mg by mouth 2 (two) times daily with a meal.        . doxazosin (CARDURA) 2 MG tablet Take 6 mg by mouth at bedtime.      . Ferrous Sulfate (IRON) 325 (65 FE) MG TABS Take 325 mg by mouth daily.       . fish oil-omega-3 fatty acids 1000 MG capsule Take 1 g by mouth 2 (two) times daily.        Marland Kitchen glipiZIDE (GLUCOTROL) 5 MG tablet Take 5 mg by mouth 2 (two) times daily before a meal.       . hydrochlorothiazide (HYDRODIURIL) 25 MG tablet Take 25 mg by mouth daily.        Marland Kitchen levothyroxine (SYNTHROID, LEVOTHROID) 25 MCG tablet Take 25 mcg by mouth daily.      . mirtazapine (REMERON) 15 MG tablet Take 15 mg by mouth every morning.       Marland Kitchen omeprazole (PRILOSEC) 20 MG capsule Take 20 mg by mouth daily.        . pioglitazone (ACTOS) 45 MG tablet Take 45 mg by mouth daily.        . simvastatin (ZOCOR) 20 MG tablet Take 20 mg by mouth at bedtime.        . sitaGLIPtin (JANUVIA) 100 MG tablet Take 50 mg by mouth daily.         No current facility-administered medications for this visit.    OBJECTIVE: Filed Vitals:   11/14/12 0938  BP: 161/55  Pulse: 68  Temp: 97.6 F (36.4 C)  Resp: 17     Body mass index is 32.3 kg/(m^2).      PHYSICAL EXAMINATION:  HEENT: Sclerae anicteric.  Conjunctivae were pink. Pupils round and reactive bilaterally. Oral mucosa is moist without ulceration or thrush. No occipital, submandibular, cervical, supraclavicular or axillar adenopathy. Lungs: clear to auscultation without wheezes. No rales or rhonchi. Heart: regular rate and rhythm. No murmur, gallop or rubs. Abdomen: soft, non tender. No guarding or rebound tenderness. Bowel sounds are present. No palpable hepatosplenomegaly. MSK: no focal spinal tenderness. Extremities: No clubbing or cyanosis.No calf  tenderness to palpitation, no peripheral edema. The patient had grossly intact strength in upper and lower extremities. Skin exam was without ecchymosis, petechiae. Neuro: non-focal, alert and oriented to time, person and place, appropriate affect  LAB RESULTS:  CMP     Component Value Date/Time   NA 149* 11/14/2012 1206   NA 140 03/17/2011 0652   K 3.6 11/14/2012 1206   K 4.3 03/17/2011 0652   CL 108 03/17/2011 0652   CO2 30* 11/14/2012 1206   CO2 23 03/07/2009 1323   GLUCOSE 134 11/14/2012 1206   GLUCOSE 195* 03/17/2011 0652   BUN 30.3* 11/14/2012 1206   BUN 37* 03/17/2011 0652   CREATININE 1.7* 11/14/2012 1206   CREATININE 1.40* 03/17/2011 0652   CALCIUM 10.1 11/14/2012 1206   CALCIUM 8.8 03/07/2009 1323  PROT 6.6 11/14/2012 1206   PROT 6.1 03/15/2007 0915   ALBUMIN 3.4* 11/14/2012 1206   ALBUMIN 3.6 03/15/2007 0915   AST 20 11/14/2012 1206   AST 20 03/15/2007 0915   ALT 19 11/14/2012 1206   ALT 15 03/15/2007 0915   ALKPHOS 65 11/14/2012 1206   ALKPHOS 78 03/15/2007 0915   BILITOT 0.35 11/14/2012 1206   BILITOT 0.6 03/15/2007 0915   GFRNONAA 39* 03/07/2009 1323   GFRAA  Value: 47        The eGFR has been calculated using the MDRD equation. This calculation has not been validated in all clinical situations. eGFR's persistently <60 mL/min signify possible Chronic Kidney Disease.* 03/07/2009 1323    I No results found for this basename: SPEP, UPEP,  kappa and lambda light chains    Lab Results  Component Value Date   WBC 4.5 11/14/2012   NEUTROABS 3.2 11/14/2012   HGB 9.3* 11/14/2012   HCT 28.6* 11/14/2012   MCV 96.0 11/14/2012   PLT 121* 11/14/2012      Chemistry      Component Value Date/Time   NA 149* 11/14/2012 1206   NA 140 03/17/2011 0652   K 3.6 11/14/2012 1206   K 4.3 03/17/2011 0652   CL 108 03/17/2011 0652   CO2 30* 11/14/2012 1206   CO2 23 03/07/2009 1323   BUN 30.3* 11/14/2012 1206   BUN 37* 03/17/2011 0652   CREATININE 1.7* 11/14/2012 1206   CREATININE 1.40* 03/17/2011 0652      Component Value Date/Time   CALCIUM  10.1 11/14/2012 1206   CALCIUM 8.8 03/07/2009 1323   ALKPHOS 65 11/14/2012 1206   ALKPHOS 78 03/15/2007 0915   AST 20 11/14/2012 1206   AST 20 03/15/2007 0915   ALT 19 11/14/2012 1206   ALT 15 03/15/2007 0915   BILITOT 0.35 11/14/2012 1206   BILITOT 0.6 03/15/2007 0915      Urinalysis    Component Value Date/Time   COLORURINE YELLOW 03/15/2007 0920   APPEARANCEUR CLEAR 03/15/2007 0920   LABSPEC 1.021 03/15/2007 0920   PHURINE 5.0 03/15/2007 0920   GLUCOSEU 100* 03/15/2007 0920   HGBUR NEGATIVE 03/15/2007 0920   BILIRUBINUR NEGATIVE 03/15/2007 0920   KETONESUR TRACE* 03/15/2007 0920   PROTEINUR 100* 03/15/2007 0920   UROBILINOGEN 1.0 03/15/2007 0920   NITRITE NEGATIVE 03/15/2007 0920   LEUKOCYTESUR TRACE* 03/15/2007 0920    STUDIES: Ct Angio Chest Pe W/cm &/or Wo Cm  11/14/2012   *RADIOLOGY REPORT*  Clinical Data: Dyspnea.  Rule out pulmonary embolism.  CT ANGIOGRAPHY CHEST  Technique:  Multidetector CT imaging of the chest using the standard protocol during bolus administration of intravenous contrast. Multiplanar reconstructed images including MIPs were obtained and reviewed to evaluate the vascular anatomy.  Contrast: 9mL OMNIPAQUE IOHEXOL 350 MG/ML SOLN  Comparison: 07/31/2005  Findings: No pulmonary embolism is seen. Airways are patent without endobronchial lesions. No pneumothorax. There is a moderate right and small left pleural effusion with adjacent compressive atelectasis. Two 2 mm pulmonary nodules are seen in the left lower lobe (images 64 and 45 of series 7). Other pulmonary nodules are unchanged from 2007 and considered benign. Changes of centrilobular emphysema are noted.  Heart is normal size with no pericardial effusion. Aorta is normal in caliber. Visualized thyroid continues to demonstrate hypodense nodules. Right thyroid lobe calcification is unchanged. No pathologically enlarged hilar, mediastinal, or axillary lymph nodes are seen. No change in prominent AP window lymph node.  Gallbladder is surgically  absent.  Visualized portion of the upper abdomen appears otherwise unremarkable. No evidence of acute osseous abnormalities.  IMPRESSION: 1. No evidence of pulmonary embolism.  2. Mild right and small left pleural effusions.  3. Two 2 mm left lower lobe pulmonary nodules, not seen on the 2007 examination. Given risk factors for bronchogenic carcinoma, follow- up chest CT at 1 year is recommended.  This recommendation follows the consensus statement:  Guidelines for Management of Small Pulmonary Nodules Detected on CT Scans:  A Statement from the [employer] as published in Radiology 2005; 237:395-400.   Original Report Authenticated By: Donavan Burnet, M.D.    ASSESSMENT AND PLAN: 1.. Anemia, grade 2  2. Thrombocytopenia, grade 1  Probably secondary to anemia of chronic kidney disease.  Initial work up with: CBC with retic count; morphology (peripheral smear. work up for Multiple myeloma; haptoglobin, DAT, von Willebrand panel, PT, aPTT was negative. Additional work up for copper deficiency and heavy metal will be done. I will check again report for vitamin B12 level by PCP and if it is not done I will order. Possibility to do bone marrow biopsy and aspiration was discussed..  3. Significant dyspnea.. I will order CT scan per PE protocol.  I will follow patient in 1 [place]     Nechama Escutia, MD   11/16/2012 4:07 AM

## 2012-11-21 ENCOUNTER — Ambulatory Visit (HOSPITAL_BASED_OUTPATIENT_CLINIC_OR_DEPARTMENT_OTHER): Payer: Medicare Other | Admitting: Family

## 2012-11-21 ENCOUNTER — Encounter: Payer: Self-pay | Admitting: Family

## 2012-11-21 ENCOUNTER — Telehealth: Payer: Self-pay | Admitting: *Deleted

## 2012-11-21 VITALS — BP 154/67 | HR 55 | Temp 96.9°F | Resp 18 | Ht 67.0 in | Wt 204.2 lb

## 2012-11-21 DIAGNOSIS — R0609 Other forms of dyspnea: Secondary | ICD-10-CM | POA: Diagnosis not present

## 2012-11-21 NOTE — Telephone Encounter (Signed)
appts made and printed...td 

## 2012-11-21 NOTE — Patient Instructions (Addendum)
Please contact us at [phone] if you have any questions or concerns.  Get plenty of rest, drink plenty of water, eat a balanced diet.  Call Dr. Theodosia Blender office today about low heart rate and tell them you are on Coreg 3.125 mg twice daily [place], [place], [place] [address] [phone].

## 2012-11-21 NOTE — Progress Notes (Signed)
[facility] OFFICE PROGRESS NOTE  Hulen Shouts, MD [place] [address]  DIAGNOSIS: Anemia, thrombocytopenia and Dyspnea.  INTERVAL HISTORY: Jackie Alvarez 79 y.o. female returns for further followup of anemia, thrombocytopenia and dyspnea.  I have reviewed the past medical history, past surgical history, social history and family history with the patient and they are unchanged from previous note.  ALLERGIES:  is allergic to omnipaque.  MEDICATIONS: has a current medication list which includes the following prescription(s): amlodipine, benazepril, calcium-vitamin d, carvedilol, doxazosin, iron, fish oil-omega-3 fatty acids, glipizide, hydrochlorothiazide, levothyroxine, mirtazapine, omeprazole, pioglitazone, simvastatin, and sitagliptin.  REVIEW OF SYSTEMS:   Review of systems from other organs were negative from significant dyspnea on minimal exertion. She has some easy bruising but no frank bleeding such as epistaxis hematuria or hematochezia. She denies any palpitation chest pain or dizziness. Denies any recent fevers, chills, or weight loss.  PHYSICAL EXAMINATION: ECOG PERFORMANCE STATUS: 2 - Symptomatic, <50% confined to bed  Filed Vitals:   11/21/12 1235  BP: 154/67  Pulse: 55  Temp: 96.9 F (36.1 C)  Resp: 18    GENERAL:alert, no distress and comfortable SKIN: skin color, texture, turgor are normal, no rashes or significant lesions EYES: normal, Conjunctiva are pink and non-injected, sclera clear OROPHARYNX:no exudate, no erythema and lips, buccal mucosa, and tongue normal  NECK: supple, thyroid normal size, non-tender, without nodularity LYMPH:  no palpable lymphadenopathy in the cervical, axillary or inguinal LUNGS: clear to auscultation and percussion with normal breathing effort HEART: regular rate & rhythm and no murmurs and no lower extremity edema ABDOMEN:abdomen soft, non-tender and normal bowel  sounds Musculoskeletal:no cyanosis of digits and no clubbing  NEURO: alert & oriented x 3 with fluent speech, no focal motor/sensory deficits  LABORATORY DATA:  I have reviewed the data as listed for the last 3 months. I have noted she does have mild thrombocytopenia as well as anemia. She also had abnormal kidney function.  RADIOGRAPHIC STUDIES: I have personally reviewed the radiological images as listed and agreed with the findings in the report. I have also shared the imaging study from last week that was ordered to evaluate for dyspnea. Apart from mild bilateral pleural effusion and changes compatible with mild emphysema, there were no evidence of blood clots.  ASSESSMENT:  #1 anemia This is likely anemia of chronic disease. She does have evidence of chronic kidney disease. I do not think her severe dyspnea is caused by this. She does not require blood transfusion at this point in time. She would need further workup in the future however would defer that to focus on solving her problems with dyspnea. #2 severe dyspnea on minimal exertion. The cause is unknown. This is likely multifactorial caused by possible undiagnosed COPD, possible cardiac disease, and anemia. This is the major cause of her poor quality of life. I recommend referral to pulmonology for evaluation. She has an appointment to see the cardiologist this week to rule out cardiac disease. #3 thrombocytopenia The cause is unknown. The thrombocytopenia is mild and I recommend we defer further workup to the future. #4 bilateral pleural effusion This could also contribute to her symptoms of severe dyspnea. I am going to defer to the pulmonologist for further workup.  All questions were answered. The patient knows to call the clinic with any problems, questions or concerns. We can certainly see the patient much sooner if necessary. No barriers to learning was detected.  The patient and plan discussed with  [place], [place]  and she is in  agreement with the aforementioned.  I spent 20 minutes counseling the patient face to face. The total time spent in the appointment was 30 minutes and more than 50% was on counseling.     [facility], [place], MD 11/21/2012 4:23 PM

## 2012-11-22 NOTE — Progress Notes (Addendum)
[place]  [phone] [phone]  OFFICE PROGRESS NOTE   Patient ID: Jackie Alvarez, female   DOB: Apr 19, 1931, 79 y.o.  [id number]      [id number]  PCP:  Carlos Levering, PA-C CARDIO:  Fransico Him, MD GI:  Ronald Lobo, M.D.   Diagnosis: Jackie Alvarez is a 80 y.o. female with history of anemia and thrombocytopenia first diagnosed in 2009.    History of Present Illness: Jackie Alvarez is a 79 y.o.  female who was sent to the hematology clinic for evaluation of anemia and thrombocytopenia.  She was consulted by Dr. Erskine Speed on 10/31/2012.  He notes she had long time history of anemia. Her Hgb was 9.8 on 03/17/2007; 11.7 on 02/13/2009; 12.7 on 03/07/2009; 11.6 on 03/17/2011; 12.4 on 06/22/2012; 10.3 on 10/05/2012 her platelets count was normal until 10/05/2012 when it was 127,000.  At that time she reported feeling cold and being dyspneic/not able to take deep breath.  She reported  An occasional cough. She also had complaints of on clear nasal discharge in morning during her consultation.   Prior Therapy: 1.  Workup for anemia and thrombocytopenia by Dr. Erskine Speed on 10/31/2012 included:  (CBC and CMP are listed in laboratories)  Direct antiglobulin test: IgG      Negative DAT (complement)    Negative  Pro time-INR: Prothrombin time    14.6 INR      1.15  SPEP and IFE with QIG:    IgG, serum     582 IgA      98 IgM, serum        46 Total protein, serum electrophoresis   6.4 Albumin ELP     58.9 Alpha-1-globulin    5.7 Alpha-2-globulin    15.4 Beta globulin     7.0 Beta-2      4.5 Gamma globulin    8.5 M-spike, %     Not detectable  Haptoglobin: Haptoglobin     108  UIFE/TP: Volume, urine      800 Time      24 hours Total protein, urine    64.2 Total protein, urine-UR/day   514 Albumin, U     Detected Alpha 1, urine     Detected Alpha 2, urine     Detected Beta, urine     Detected Gamma globulin, urine   Detected Free  kappa light chains, urine   2.88 Free light chains excr rate   23.04 Free lambda Lt chains, urine   0.60 Free lambda excretion/day   4.80 Free kappa/lambda ratio   4.80 Interpretation: No monoclonal free light chains (Bence Jones protein) are detected.  Urine IFE shows polyclonal increase in free kappa and/or free lambda light chains.  Von Willebrand panel: Coagulation factor VIII    167 Von Willebrand antigen, plasma  150 Ristocetin Co-Factor, plasma  119  Morphology: Polychromasia    Slight White cell comments    C/W auto differential PLT EST     decreased   Heavy metals, blood: Arsenic     <3 Mercury, B     <4 Lead       <2 Specimen (C/V)    Venous Collecting site     Venous  Copper, Serum       Copper      90  Ceruloplasmin Ceruloplasmin     28  Peripheral smear available     Current Therapy: None   Interval History: Dr. Alvy Bimler and I saw Jackie Alvarez today  for followup of anemia and thrombocytopenia consult on 10/31/2012 and subsequent CTA of the chest ordered by Dr. Erskine Speed for dyspnea on exertion.  She is accompanied by her church friend Jackie Alvarez for today's office visit.   She continues to have complaints of dyspnea on exertion that is relieved when she is sitting or lying  down.  She reports a decrease in appetite.  She states her right ear has been "plugged" for the past 2 Xu. The patient states that her blood glucose levels have been ranging from the 50s to the 70s for the past month.  She also states that she has developed spontaneous bruising on her arms/hands.  Jackie Alvarez states was evaluated by Dr. Cristina Gong, Gastroenterologist on 11/16/2012.  She states that he did not attribute her anemia/thrombocytopenia to a GI  origin.  Her heart rate is between 45-47 BPM today and her oxygen saturations are between 93% to 95% on room air today.  The patient visibly appears fatigued and has mildly labored breathing.   She is establishing care with Dr. Calton Dach  service today.   Medical History: Past Medical History  Diagnosis Date  . Diabetes mellitus   . Hypertension   . Leg pain   . Peripheral vascular disease   . Hyperlipidemia   . Hemorrhoids   . Carotid artery occlusion   . Ulcer   . COPD (chronic obstructive pulmonary disease)   . Chronic kidney disease   . Cancer 1994    Left Breast    Surgical History: Past Surgical History  Procedure Laterality Date  . Wrist surgery  02/2009  . Cholecystectomy      Gall Bladder  . Pr vein bypass graft,aorto-fem-pop    . Abdominal hysterectomy    . Carotid endarterectomy    . Breast lumpectomy    . Abdominal aortagram  Jan. 18, 2013    Family History: Family History  Problem Relation Age of Onset  . Cancer Mother     stomach  . Cancer Father     liver  . Heart disease Sister     Social History: History  Substance Use Topics  . Smoking status: Former Smoker -- 1.00 packs/day for 45 years    Types: Cigarettes    Quit date: 01/26/1997  . Smokeless tobacco: Never Used  . Alcohol Use: No    Allergies: Allergies  Allergen Reactions  . Omnipaque [Iohexol] Nausea And Vomiting    unknown    Current Medications: Current Outpatient Prescriptions  Medication Sig Dispense Refill  . amLODipine (NORVASC) 10 MG tablet Take 10 mg by mouth daily.        . benazepril (LOTENSIN) 40 MG tablet Take 40 mg by mouth daily.        . calcium-vitamin D (OSCAL 500/200 D-3) 500-200 MG-UNIT per tablet Take 1 tablet by mouth 2 (two) times daily.        . carvedilol (COREG) 3.125 MG tablet Take 3.125 mg by mouth 2 (two) times daily with a meal.      . doxazosin (CARDURA) 2 MG tablet Take 6 mg by mouth at bedtime.      . Ferrous Sulfate (IRON) 325 (65 FE) MG TABS Take 325 mg by mouth 3 (three) times daily.       . fish oil-omega-3 fatty acids 1000 MG capsule Take 1 g by mouth 2 (two) times daily.        Marland Kitchen glipiZIDE (GLUCOTROL) 5 MG tablet Take 5 mg by mouth 2 (two) times  daily before a meal.        . hydrochlorothiazide (HYDRODIURIL) 25 MG tablet Take 25 mg by mouth daily.        Marland Kitchen levothyroxine (SYNTHROID, LEVOTHROID) 25 MCG tablet Take 25 mcg by mouth daily.      . mirtazapine (REMERON) 15 MG tablet Take 15 mg by mouth every morning.       Marland Kitchen omeprazole (PRILOSEC) 20 MG capsule Take 20 mg by mouth daily.        . pioglitazone (ACTOS) 45 MG tablet Take 45 mg by mouth daily.        . simvastatin (ZOCOR) 20 MG tablet Take 20 mg by mouth at bedtime.        . sitaGLIPtin (JANUVIA) 100 MG tablet Take 50 mg by mouth daily.         No current facility-administered medications for this visit.     Review of Systems: A 10 Point review of systems was completed and is negative except as noted above.  Jackie Alvarez denies any other symptomatology including fever or chills, headache, vision changes, swollen glands, cough, chest pain or discomfort, nausea, vomiting, diarrhea, constipation, change in urinary or bowel habits, arthralgias/myalgias, unusual bleeding or any other symptomatology.   Physical Exam:   Blood pressure 154/67, pulse 55, temperature 96.9 F (36.1 C), temperature source Oral, resp. rate 18, height 5\' 7"  (1.702 m), weight 204 lb 3.2 oz (92.625 kg), SpO2 92.00%.  General appearance: Alert, cooperative, well nourished, visible fatigue, mild distress Head: Normocephalic, without obvious abnormality, atraumatic, dentures Eyes: Arcus senilis, PERRLA, EOMI Nose: Nares, septum and mucosa are normal, no drainage or sinus tenderness Neck: No adenopathy, supple, symmetrical, trachea midline, no tenderness Resp: Clear to auscultation bilaterally, no wheezes/rales/rhonchi, mildly labored breathing  Cardio: Sinus bradycardia,  no murmur, click, rub or gallop, +2 bilateral lower extremity dependent edema GI: Soft, distended, non-tender, hypoactive bowel sounds, no organomegaly Skin: No rashes/lesions, skin warm and dry, no erythematous areas, no cyanosis, bilateral lower extremity has signs of  venous stasis, ecchymosis bilateral upper extremities  M/S:  Atraumatic, decreased strength in the upper extremities 3/5, normal range of motion, no clubbing Lymph nodes: Cervical, supraclavicular, and axillary nodes normal Neurologic: Grossly normal, cranial nerves II through XII intact, alert and oriented x 3 Psych: Appropriate affect   ECOG Performance Status: ECOG FS: 1 - Symptomatic but completely ambulatory   Laboratory Data: CBC    Component Value Date/Time   WBC 4.5 11/14/2012 1206   WBC 7.4 03/07/2009 1323   RBC 2.98* 11/14/2012 1206   RBC 3.96 03/07/2009 1323   HGB 9.3* 11/14/2012 1206   HGB 11.6* 03/17/2011 0652   HCT 28.6* 11/14/2012 1206   HCT 34.0* 03/17/2011 0652   PLT 121* 11/14/2012 1206   PLT 218 03/07/2009 1323   MCV 96.0 11/14/2012 1206   MCV 93.8 03/07/2009 1323   MCH 31.2 11/14/2012 1206   MCHC 32.5 11/14/2012 1206   MCHC 34.2 03/07/2009 1323   RDW 15.0* 11/14/2012 1206   RDW 12.8 03/07/2009 1323   LYMPHSABS 1.0 11/14/2012 1206   LYMPHSABS 1.3 02/11/2009 0845   MONOABS 0.2 11/14/2012 1206   MONOABS 0.3 02/11/2009 0845   EOSABS 0.2 11/14/2012 1206   EOSABS 0.2 02/11/2009 0845   BASOSABS 0.0 11/14/2012 1206   BASOSABS 0.0 02/11/2009 0845    CMP     Component Value Date/Time   NA 149* 11/14/2012 1206   NA 140 03/17/2011 0652   K 3.6 11/14/2012 1206  K 4.3 03/17/2011 0652   CL 108 03/17/2011 0652   CO2 30* 11/14/2012 1206   CO2 23 03/07/2009 1323   GLUCOSE 134 11/14/2012 1206   GLUCOSE 195* 03/17/2011 0652   BUN 30.3* 11/14/2012 1206   BUN 37* 03/17/2011 0652   CREATININE 1.7* 11/14/2012 1206   CREATININE 1.40* 03/17/2011 0652   CALCIUM 10.1 11/14/2012 1206   CALCIUM 8.8 03/07/2009 1323   PROT 6.6 11/14/2012 1206   PROT 6.1 03/15/2007 0915   ALBUMIN 3.4* 11/14/2012 1206   ALBUMIN 3.6 03/15/2007 0915   AST 20 11/14/2012 1206   AST 20 03/15/2007 0915   ALT 19 11/14/2012 1206   ALT 15 03/15/2007 0915   ALKPHOS 65 11/14/2012 1206   ALKPHOS 78 03/15/2007 0915   BILITOT 0.35 11/14/2012 1206   BILITOT 0.6 03/15/2007  0915   GFRNONAA 39* 03/07/2009 1323   GFRAA  Value: 47        The eGFR has been calculated using the MDRD equation. This calculation has not been validated in all clinical situations. eGFR's persistently <60 mL/min signify possible Chronic Kidney Disease.* 03/07/2009 1323    Imaging Studies: Ct Angio Chest Pe W/cm &/or Wo Cm 11/14/2012   *RADIOLOGY REPORT*  Clinical Data: Dyspnea.  Rule out pulmonary embolism.  CT ANGIOGRAPHY CHEST  Technique:  Multidetector CT imaging of the chest using the standard protocol during bolus administration of intravenous contrast. Multiplanar reconstructed images including MIPs were obtained and reviewed to evaluate the vascular anatomy.  Contrast: 2mL OMNIPAQUE IOHEXOL 350 MG/ML SOLN  Comparison: 07/31/2005  Findings: No pulmonary embolism is seen. Airways are patent without endobronchial lesions. No pneumothorax. There is a moderate right and small left pleural effusion with adjacent compressive atelectasis. Two 2 mm pulmonary nodules are seen in the left lower lobe (images 64 and 45 of series 7). Other pulmonary nodules are unchanged from 2007 and considered benign. Changes of centrilobular emphysema are noted.  Heart is normal size with no pericardial effusion. Aorta is normal in caliber. Visualized thyroid continues to demonstrate hypodense nodules. Right thyroid lobe calcification is unchanged. No pathologically enlarged hilar, mediastinal, or axillary lymph nodes are seen. No change in prominent AP window lymph node.  Gallbladder is surgically absent. Visualized portion of the upper abdomen appears otherwise unremarkable. No evidence of acute osseous abnormalities.  IMPRESSION: 1. No evidence of pulmonary embolism.  2. Mild right and small left pleural effusions.  3. Two 2 mm left lower lobe pulmonary nodules, not seen on the 2007 examination. Given risk factors for bronchogenic carcinoma, follow- up chest CT at 1 year is recommended.  This recommendation follows the  consensus statement:  Guidelines for Management of Small Pulmonary Nodules Detected on CT Scans:  A Statement from the [place] as published in Radiology 2005; 237:395-400.   Original Report Authenticated By: Donavan Burnet, M.D.     Assessment: 1.  Chronic anemia and thrombocytopenia  2.  Bradycardia  3.  Dyspnea on exertion   Plan: 1.  With regards to chronic anemia and thrombocytopenia, Dr. Alvy Bimler would like the patient to address other metabolic needs at this time including bradycardia and dyspnea.  We will plan to Jackie Alvarez her again in one month at which time they will discuss her care plan for the patient's anemia and thrombocytopenia.    2.  I contacted the patient's cardiologist Dr. Theodosia Blender office and spoke to her CMA Amy.  Explained my concern for her bradycardia and what would they like her to do in  the meantime knowing that Jackie Alvarez has an appointment with Dr. [name] on 11/24/2012.  The patient was instructed to discontinue her Coreg until seen by Dr. [name] in a few days.  3.  The patient has an appointment to see Dr. Danton Alvarez, pulmonologist on 11/24/2012.  Jackie Alvarez was provided a copy of her recent CTA of the chest and given an explanation of the results by Dr. Alvy Bimler.  All questions answered.  Jackie Alvarez and her friend Jackie Alvarez were encouraged to contact us in the interim with any questions, concerns, or problems.   Ailene Ards, NP-C 11/22/2012, 3:27 PM

## 2012-11-24 ENCOUNTER — Encounter: Payer: Self-pay | Admitting: Pulmonary Disease

## 2012-11-24 ENCOUNTER — Ambulatory Visit (INDEPENDENT_AMBULATORY_CARE_PROVIDER_SITE_OTHER): Payer: Medicare Other | Admitting: Pulmonary Disease

## 2012-11-24 VITALS — BP 144/58 | HR 63 | Ht 67.0 in | Wt 203.8 lb

## 2012-11-24 DIAGNOSIS — E7800 Pure hypercholesterolemia, unspecified: Secondary | ICD-10-CM | POA: Diagnosis not present

## 2012-11-24 DIAGNOSIS — I495 Sick sinus syndrome: Secondary | ICD-10-CM | POA: Diagnosis not present

## 2012-11-24 DIAGNOSIS — R0602 Shortness of breath: Secondary | ICD-10-CM | POA: Diagnosis not present

## 2012-11-24 DIAGNOSIS — R609 Edema, unspecified: Secondary | ICD-10-CM | POA: Diagnosis not present

## 2012-11-24 DIAGNOSIS — R918 Other nonspecific abnormal finding of lung field: Secondary | ICD-10-CM | POA: Diagnosis not present

## 2012-11-24 DIAGNOSIS — I10 Essential (primary) hypertension: Secondary | ICD-10-CM | POA: Diagnosis not present

## 2012-11-24 DIAGNOSIS — I4892 Unspecified atrial flutter: Secondary | ICD-10-CM | POA: Diagnosis not present

## 2012-11-24 DIAGNOSIS — J439 Emphysema, unspecified: Secondary | ICD-10-CM | POA: Insufficient documentation

## 2012-11-24 DIAGNOSIS — R0609 Other forms of dyspnea: Secondary | ICD-10-CM

## 2012-11-24 NOTE — Progress Notes (Signed)
  Subjective:    Patient ID: Jackie Alvarez, female    DOB: 1931/10/02, 79 y.o.   MRN: [id number]  HPI The patient is a 79 year old female who I've been asked to see for dyspnea on exertion.  The patient states her breathing has been at her usual baseline until approximately 2 [place] ago, when she began to notice worsening dyspnea on exertion.  She describes a less than one block shortness of breath at a moderate pace on flat ground, and will get winded walking up one flight of stairs.  She will also get winded bringing groceries in from the car.  She has an occasional cough but never produces mucus.  She has no history of asthma as a child, but does have an extensive tobacco abuse history until 1998.  He has never had pulmonary function studies, but has had a CT chest this month.  This shows small bilateral pleural effusions right greater than left with adjacent atelectasis.  There was also 2, 2 mm nodules that will need followup in one year.  There was no evidence for thromboembolic disease.  The patient denies any cardiac history, but does have some lower extremity edema that is chronic.  She also has a history of chronic anemia with a hemoglobin of 9 at baseline.   Review of Systems  Constitutional: Negative for fever and unexpected weight change.  HENT: Negative for ear pain, nosebleeds, congestion, sore throat, rhinorrhea, sneezing, trouble swallowing, dental problem, postnasal drip and sinus pressure.   Eyes: Negative for redness and itching.  Respiratory: Positive for cough and shortness of breath. Negative for chest tightness and wheezing.   Cardiovascular: Negative for palpitations and leg swelling.  Gastrointestinal: Negative for nausea and vomiting.  Genitourinary: Negative for dysuria.  Musculoskeletal: Negative for joint swelling.  Skin: Negative for rash.  Neurological: Negative for headaches.  Hematological: Does not bruise/bleed easily.  Psychiatric/Behavioral: Negative for dysphoric  mood. The patient is not nervous/anxious.        Objective:   Physical Exam Constitutional:  Well developed, no acute distress  HENT:  Nares patent without discharge, enlarged turbinates.  Oropharynx without exudate, palate and uvula are normal  Eyes:  Perrla, eomi, no scleral icterus  Neck:  No JVD, no TMG  Cardiovascular:  Normal rate, regular rhythm, no rubs or gallops. 2/6 sem        Decreased distal pulses.   Pulmonary :  Mildly decreased breath sounds in lower lung zones, no stridor or respiratory distress   No rales, rhonchi, or wheezing  Abdominal:  Soft, nondistended, bowel sounds present.  No tenderness noted.   Musculoskeletal:  +1 lower extremity edema noted.  Lymph Nodes:  No cervical lymphadenopathy noted  Skin:  No cyanosis noted  Neurologic:  Alert, appropriate, moves all 4 extremities without obvious deficit.         Assessment & Plan:

## 2012-11-24 NOTE — Patient Instructions (Addendum)
Will schedule you for breathing studies, and will see you back the same day to review. Keep your apptm with cardiology.

## 2012-11-24 NOTE — Assessment & Plan Note (Signed)
The patient has a history of dyspnea on exertion for the last 2 [place], but states that her breathing was normal for her as little as a month ago.  A long history of smoking, and may have some obstructive airways disease, but her lungs are clear to auscultation today.  I have reviewed the various causes of dyspnea on exertion, including lung issues, heart issues, weight and deconditioning, and also things such as chronic anemia and hypothyroidism.  A lot of these tend to be multifactorial.  She obviously needs to have pulmonary function studies to evaluate for obstructive lung disease, but the presence of small bilateral effusions on CT chest is cardiac in origin until proven otherwise.  If she has a complete cardiac workup and it is unremarkable, we can work up further for autoimmune disease.  In the meantime, I would like to see her back after her pulmonary function studies.

## 2012-11-24 NOTE — Addendum Note (Signed)
Addended by: Virl Cagey on: 11/24/2012 11:12 AM   Modules accepted: Orders

## 2012-11-24 NOTE — Assessment & Plan Note (Signed)
The pt will need a f/u ct in one year given her extensive smoking history.

## 2012-12-01 DIAGNOSIS — I495 Sick sinus syndrome: Secondary | ICD-10-CM | POA: Diagnosis not present

## 2012-12-01 DIAGNOSIS — Z79899 Other long term (current) drug therapy: Secondary | ICD-10-CM | POA: Diagnosis not present

## 2012-12-01 DIAGNOSIS — I5032 Chronic diastolic (congestive) heart failure: Secondary | ICD-10-CM | POA: Diagnosis not present

## 2012-12-01 DIAGNOSIS — I10 Essential (primary) hypertension: Secondary | ICD-10-CM | POA: Diagnosis not present

## 2012-12-02 ENCOUNTER — Other Ambulatory Visit: Payer: Self-pay | Admitting: Cardiology

## 2012-12-02 DIAGNOSIS — Z79899 Other long term (current) drug therapy: Secondary | ICD-10-CM

## 2012-12-05 ENCOUNTER — Other Ambulatory Visit (HOSPITAL_COMMUNITY): Payer: Self-pay | Admitting: Cardiology

## 2012-12-05 DIAGNOSIS — R0602 Shortness of breath: Secondary | ICD-10-CM

## 2012-12-08 ENCOUNTER — Other Ambulatory Visit (HOSPITAL_COMMUNITY): Payer: Self-pay | Admitting: Cardiology

## 2012-12-08 ENCOUNTER — Ambulatory Visit (HOSPITAL_COMMUNITY): Payer: Medicare Other | Attending: Cardiology | Admitting: Radiology

## 2012-12-08 ENCOUNTER — Telehealth: Payer: Self-pay | Admitting: General Surgery

## 2012-12-08 ENCOUNTER — Ambulatory Visit (INDEPENDENT_AMBULATORY_CARE_PROVIDER_SITE_OTHER): Payer: Medicare Other | Admitting: Cardiology

## 2012-12-08 DIAGNOSIS — R0602 Shortness of breath: Secondary | ICD-10-CM

## 2012-12-08 DIAGNOSIS — I359 Nonrheumatic aortic valve disorder, unspecified: Secondary | ICD-10-CM | POA: Diagnosis not present

## 2012-12-08 DIAGNOSIS — Z87891 Personal history of nicotine dependence: Secondary | ICD-10-CM | POA: Insufficient documentation

## 2012-12-08 DIAGNOSIS — R609 Edema, unspecified: Secondary | ICD-10-CM | POA: Insufficient documentation

## 2012-12-08 DIAGNOSIS — E669 Obesity, unspecified: Secondary | ICD-10-CM | POA: Diagnosis not present

## 2012-12-08 DIAGNOSIS — I079 Rheumatic tricuspid valve disease, unspecified: Secondary | ICD-10-CM | POA: Insufficient documentation

## 2012-12-08 DIAGNOSIS — R0609 Other forms of dyspnea: Secondary | ICD-10-CM | POA: Insufficient documentation

## 2012-12-08 DIAGNOSIS — Z79899 Other long term (current) drug therapy: Secondary | ICD-10-CM

## 2012-12-08 DIAGNOSIS — R011 Cardiac murmur, unspecified: Secondary | ICD-10-CM

## 2012-12-08 DIAGNOSIS — R0989 Other specified symptoms and signs involving the circulatory and respiratory systems: Secondary | ICD-10-CM | POA: Insufficient documentation

## 2012-12-08 LAB — BASIC METABOLIC PANEL
Calcium: 10.3 mg/dL (ref 8.4–10.5)
Creatinine, Ser: 1.6 mg/dL — ABNORMAL HIGH (ref 0.4–1.2)
GFR: 32.31 mL/min — ABNORMAL LOW (ref >60.00)
Glucose, Bld: 55 mg/dL — ABNORMAL LOW (ref 70–99)
Sodium: 146 mEq/L — ABNORMAL HIGH (ref 135–145)

## 2012-12-08 MED ORDER — FUROSEMIDE 20 MG PO TABS: 20.0000 mg | ORAL_TABLET | ORAL | Status: DC

## 2012-12-08 MED ORDER — POTASSIUM CHLORIDE CRYS ER 20 MEQ PO TBCR: 20.0000 meq | EXTENDED_RELEASE_TABLET | ORAL | Status: DC

## 2012-12-08 NOTE — Telephone Encounter (Signed)
Made pt aware of ECHO report.

## 2012-12-08 NOTE — Telephone Encounter (Signed)
Message copied by Lily Kocher on Thu Dec 08, 2012  5:22 PM ------      Message from: Fransico Him R      Created: Thu Dec 08, 2012  5:12 PM       Please have her decrease her Lasix to 20mg  every other day due to decreased renal function from diuretic.  Please let patient know that her potassium is low due to Lasix.  Please start Kdur 82meq 2 tablets today then 1 tablet every other day along with Lasix  and recheck on Monday 12/12/12. ------

## 2012-12-08 NOTE — Telephone Encounter (Signed)
Message copied by Jackie Alvarez on Thu Dec 08, 2012  2:44 PM ------      Message from: Fransico Him R      Created: Thu Dec 08, 2012  2:31 PM       Please let patient know that heart function is normal with increased stiffness of heart muscle, mildly thickened AV ------

## 2012-12-08 NOTE — Progress Notes (Signed)
Echocardiogram performed.  

## 2012-12-09 NOTE — Progress Notes (Signed)
Patient stated that she only takes Lasix very other day now so she was instructed to decrease Lasix to 20mg  every 3rd day due to renal insufficiency.Since her potassium level was 3.1 she was instructed to take Kdur 77meq 2 tablets 10/2 then take 1 tablet every 3rd day with her Lasix and followup with BM[facility] in 1 week

## 2012-12-15 DIAGNOSIS — E161 Other hypoglycemia: Secondary | ICD-10-CM | POA: Diagnosis not present

## 2012-12-15 DIAGNOSIS — R7301 Impaired fasting glucose: Secondary | ICD-10-CM | POA: Diagnosis not present

## 2012-12-16 DIAGNOSIS — E162 Hypoglycemia, unspecified: Secondary | ICD-10-CM | POA: Diagnosis not present

## 2012-12-16 DIAGNOSIS — E161 Other hypoglycemia: Secondary | ICD-10-CM | POA: Diagnosis not present

## 2012-12-16 DIAGNOSIS — R7301 Impaired fasting glucose: Secondary | ICD-10-CM | POA: Diagnosis not present

## 2012-12-22 ENCOUNTER — Institutional Professional Consult (permissible substitution): Payer: Medicare Other | Admitting: Pulmonary Disease

## 2012-12-23 ENCOUNTER — Other Ambulatory Visit: Payer: Self-pay | Admitting: Hematology and Oncology

## 2012-12-23 ENCOUNTER — Ambulatory Visit: Payer: Self-pay

## 2012-12-26 ENCOUNTER — Ambulatory Visit: Payer: Medicare Other | Admitting: Hematology and Oncology

## 2012-12-26 ENCOUNTER — Other Ambulatory Visit: Payer: Medicare Other | Admitting: Lab

## 2012-12-27 ENCOUNTER — Ambulatory Visit (INDEPENDENT_AMBULATORY_CARE_PROVIDER_SITE_OTHER): Payer: Medicare Other | Admitting: Pulmonary Disease

## 2012-12-27 ENCOUNTER — Encounter: Payer: Self-pay | Admitting: Cardiology

## 2012-12-27 ENCOUNTER — Encounter: Payer: Self-pay | Admitting: Pulmonary Disease

## 2012-12-27 VITALS — BP 124/58 | HR 78 | Temp 97.4°F | Ht 67.0 in | Wt 192.0 lb

## 2012-12-27 DIAGNOSIS — J439 Emphysema, unspecified: Secondary | ICD-10-CM

## 2012-12-27 DIAGNOSIS — J438 Other emphysema: Secondary | ICD-10-CM | POA: Diagnosis not present

## 2012-12-27 DIAGNOSIS — D649 Anemia, unspecified: Secondary | ICD-10-CM | POA: Insufficient documentation

## 2012-12-27 DIAGNOSIS — Z23 Encounter for immunization: Secondary | ICD-10-CM | POA: Diagnosis not present

## 2012-12-27 DIAGNOSIS — J90 Pleural effusion, not elsewhere classified: Secondary | ICD-10-CM | POA: Diagnosis not present

## 2012-12-27 DIAGNOSIS — I10 Essential (primary) hypertension: Secondary | ICD-10-CM | POA: Insufficient documentation

## 2012-12-27 DIAGNOSIS — E079 Disorder of thyroid, unspecified: Secondary | ICD-10-CM | POA: Insufficient documentation

## 2012-12-27 DIAGNOSIS — I5032 Chronic diastolic (congestive) heart failure: Secondary | ICD-10-CM | POA: Insufficient documentation

## 2012-12-27 DIAGNOSIS — R0609 Other forms of dyspnea: Secondary | ICD-10-CM | POA: Diagnosis not present

## 2012-12-27 DIAGNOSIS — R918 Other nonspecific abnormal finding of lung field: Secondary | ICD-10-CM

## 2012-12-27 LAB — PULMONARY FUNCTION TEST

## 2012-12-27 NOTE — Progress Notes (Signed)
PFT done today. 

## 2012-12-27 NOTE — Progress Notes (Signed)
  Subjective:    Patient ID: Jackie Alvarez, female    DOB: 1931/09/10, 79 y.o.   MRN: [id number]  HPI The patient comes in today for followup after her recent pulmonary function studies, as part of the workup for dyspnea on exertion.  She was found to have moderate airflow obstruction, with an FEV1 percent of 63.  There was significant improvement with bronchodilators.  The patient had no restriction, and her diffusion capacity was moderate to severely reduced.  I have reviewed the study with her in detail, and answered all of her questions.   Review of Systems  Constitutional: Negative for fever and unexpected weight change.  HENT: Negative for congestion, dental problem, ear pain, nosebleeds, postnasal drip, rhinorrhea, sinus pressure, sneezing, sore throat and trouble swallowing.   Eyes: Negative for redness and itching.  Respiratory: Positive for cough ( at night). Negative for chest tightness, shortness of breath and wheezing.   Cardiovascular: Negative for palpitations and leg swelling.  Gastrointestinal: Negative for nausea and vomiting.  Genitourinary: Negative for dysuria.  Musculoskeletal: Negative for joint swelling.  Skin: Negative for rash.  Neurological: Negative for headaches.  Hematological: Does not bruise/bleed easily.  Psychiatric/Behavioral: Negative for dysphoric mood. The patient is not nervous/anxious.        Objective:   Physical Exam Overweight female in no acute distress Nose without purulence or discharge noted Neck without lymphadenopathy or thyromegaly Chest with decreased breath sounds, no wheezing Lower extremities with mild edema, no cyanosis Alert and oriented, moves all 4 extremities.       Assessment & Plan:

## 2012-12-27 NOTE — Patient Instructions (Signed)
Start anoro one inhalation each am.  Rinse mouth well.  Take everyday ,whether you think you need or not. Albuterol for rescue only if you get into breathing difficulty.  Do not take until after you sit down and rest if short of breath.  Can take 2 puffs up to every 6hrs if needed.  Work on weight loss and conditioning.  Please call me in 4 [place] with how things are going.  followup with me again in 21mos.

## 2012-12-27 NOTE — Assessment & Plan Note (Signed)
The patient has moderate airflow obstruction based on her PFTs today, and given her extensive smoking history this is most consistent with emphysema.  I would like to start her on a bronchodilator regimen, and also stressed the importance of weight loss and conditioning.  She also needs to make sure there is not a cardiac component to her disease.

## 2012-12-27 NOTE — Assessment & Plan Note (Signed)
Bilateral pleural effusions are secondary to cardiac disease until proven otherwise.  She has an upcoming appointment with cardiology, and if their workup is unremarkable, she will need to be tested for autoimmune disease.

## 2012-12-28 ENCOUNTER — Ambulatory Visit (INDEPENDENT_AMBULATORY_CARE_PROVIDER_SITE_OTHER): Payer: Medicare Other | Admitting: Cardiology

## 2012-12-28 ENCOUNTER — Encounter: Payer: Self-pay | Admitting: Cardiology

## 2012-12-28 VITALS — BP 138/78 | HR 60 | Ht 67.0 in | Wt 193.4 lb

## 2012-12-28 DIAGNOSIS — I5032 Chronic diastolic (congestive) heart failure: Secondary | ICD-10-CM

## 2012-12-28 DIAGNOSIS — I359 Nonrheumatic aortic valve disorder, unspecified: Secondary | ICD-10-CM

## 2012-12-28 DIAGNOSIS — I10 Essential (primary) hypertension: Secondary | ICD-10-CM

## 2012-12-28 DIAGNOSIS — R609 Edema, unspecified: Secondary | ICD-10-CM

## 2012-12-28 DIAGNOSIS — I4891 Unspecified atrial fibrillation: Secondary | ICD-10-CM | POA: Diagnosis not present

## 2012-12-28 DIAGNOSIS — I272 Pulmonary hypertension, unspecified: Secondary | ICD-10-CM | POA: Insufficient documentation

## 2012-12-28 DIAGNOSIS — I350 Nonrheumatic aortic (valve) stenosis: Secondary | ICD-10-CM

## 2012-12-28 DIAGNOSIS — R600 Localized edema: Secondary | ICD-10-CM | POA: Insufficient documentation

## 2012-12-28 DIAGNOSIS — I480 Paroxysmal atrial fibrillation: Secondary | ICD-10-CM

## 2012-12-28 MED ORDER — APIXABAN 2.5 MG PO TABS: 2.5000 mg | ORAL_TABLET | Freq: Two times a day (BID) | ORAL | Status: DC

## 2012-12-28 MED ORDER — METOPROLOL SUCCINATE ER 25 MG PO TB24: ORAL_TABLET | ORAL | Status: DC

## 2012-12-28 NOTE — Progress Notes (Signed)
[place], [place], [place]  [address] Phone: [phone] Fax:  [phone]  Date:  12/28/2012   ID:  Jackie Alvarez, DOB 1931/05/29, MRN [id number]  PCP:  Hulen Shouts, MD  Cardiologist:  Fransico Him, MD     History of Present Illness: Jackie Alvarez is a 79 y.o. female with a history of atrial fibrillation/PVD and HTN.  She recently was having SOB and was evaluated by me.  Her TSH was elevated and she was anemic.  It was felt that her SOB was due chronic diastolic CHF exacerbated by anemia and hypothyroidism and her synthroid was adjusted.  She saw Dr. [name] yesterday who diagnosed with COPD and was placed on inhalers.  She says that her SOB has improved since adjusting her synthroid.  She denies any chest pain and she has not had any further problems with SOB.  She has some chronic LE edema which is stable.  She denies any palpitations, dizziness or syncope.   Wt Readings from Last 3 Encounters:  12/28/12 193 lb 6.4 oz (87.726 kg)  12/27/12 192 lb (87.091 kg)  11/24/12 203 lb 12.8 oz (92.443 kg)     Past Medical History  Diagnosis Date  . Diabetes mellitus   . Hypertension   . Leg pain   . Peripheral vascular disease   . Hyperlipidemia   . Hemorrhoids   . Ulcer 2009    gatric with history of bleeding - resolved  . COPD (chronic obstructive pulmonary disease)   . Cancer 1994    Left Breast  . Carotid artery occlusion     mild bilateral  . Anxiety   . Central retinal vein occlusion, right eye   . Anemia   . Chronic kidney disease     CKD stage III  . Atrial flutter 2008    remote  . Thyroid disease     toxic multinodular goiter with history of thyrotoxicosis now s/p RAI ablative therapy - now hypothyroid  . Depression   . Peripheral neuropathy   . GERD (gastroesophageal reflux disease)   . Obesity   . Hiatal hernia   . Schatzki's ring   . Cholelithiases   . Anorexia nervosa   . Chronic diastolic CHF (congestive heart failure)      Current Outpatient Prescriptions  Medication Sig Dispense Refill  . amLODipine (NORVASC) 10 MG tablet Take 10 mg by mouth daily.        . benazepril (LOTENSIN) 40 MG tablet Take 40 mg by mouth daily.        . calcium-vitamin D (OSCAL 500/200 D-3) 500-200 MG-UNIT per tablet Take 1 tablet by mouth 2 (two) times daily.        Marland Kitchen doxazosin (CARDURA) 2 MG tablet Take 6 mg by mouth at bedtime.      . Ferrous Sulfate (IRON) 325 (65 FE) MG TABS Take 325 mg by mouth 3 (three) times daily.       . fish oil-omega-3 fatty acids 1000 MG capsule Take 1 g by mouth 2 (two) times daily.        . furosemide (LASIX) 20 MG tablet Take 1 tablet (20 mg total) by mouth every 3 (three) days. Every Three days along with kdur  45 tablet  3  . hydrochlorothiazide (HYDRODIURIL) 25 MG tablet Take 25 mg by mouth daily.        Marland Kitchen levothyroxine (SYNTHROID, LEVOTHROID) 25 MCG tablet Take 50 mcg by mouth daily.       Marland Kitchen  mirtazapine (REMERON) 15 MG tablet Take 15 mg by mouth every morning.       Marland Kitchen omeprazole (PRILOSEC) 20 MG capsule Take 20 mg by mouth daily.        . pioglitazone (ACTOS) 45 MG tablet Take 30 mg by mouth daily.       . potassium chloride SA (K-DUR,KLOR-CON) 20 MEQ tablet Take 1 tablet (20 mEq total) by mouth every 3 (three) days. Every three days with lasix  45 tablet  3  . simvastatin (ZOCOR) 20 MG tablet Take 20 mg by mouth at bedtime.        . sitaGLIPtin (JANUVIA) 100 MG tablet Take 50 mg by mouth daily.         No current facility-administered medications for this visit.    Allergies:    Allergies  Allergen Reactions  . Metformin And Related     Renal insuff  . Omnipaque [Iohexol] Nausea And Vomiting    unknown    Social History:  The patient  reports that she quit smoking about 15 years ago. Her smoking use included Cigarettes. She has a 45 pack-year smoking history. She has never used smokeless tobacco. She reports that she does not drink alcohol or use illicit drugs.   Family History:  The  patient's family history includes Cancer in her father, mother, and sister; Cirrhosis in her father.   ROS:  Please see the history of present illness.      All other systems reviewed and negative.   PHYSICAL EXAM: VS:  BP 138/78  Pulse 60  Ht 5\' 7"  (1.702 m)  Wt 193 lb 6.4 oz (87.726 kg)  BMI 30.28 kg/m2 Well nourished, well developed, in no acute distress HEENT: normal Neck: no JVD Cardiac:  normal S1, S2; RRR; no murmur, occasional ectopy Lungs:  clear to auscultation bilaterally, no wheezing, rhonchi or rales Abd: soft, nontender, no hepatomegaly Ext: 1+edema Skin: warm and dry Neuro:  CNs 2-12 intact, no focal abnormalities noted   EKG:  Atrial fibrillation with CVR with nonspecific ST abnormality with HR 96bpm  ASSESSMENT AND PLAN:  1. Chronic diastolc CHF - resolved  - Continue ACE I/Lasix/potassium 2. HTN - controlled  - continue ACE I/Doxazosin/amlodipine 3. Chronic LE edema  - continue lasix/potassium  - stop HCTZ due to reduced renal function.  She will let me know if her LE edema worsens 4. Mild AS by echo 12/2012 5. Atrial fibrillation with HR in the 90's at rest.  Her CHADS VASC score is 5 (age>65, female,CHF,HTN)  - I will start her on Eliquis 2.5mg  BID (creatinine clearance 26ml/min and she is almost 80)  - start Toprol XL 25mg  1/2 tablet daily 6.  Anemia of chronic disease.  She has recently been evaluated but Dr. Cristina Gong and felt that her anemia was not GI related given her recent normal colonoscopy and heme negative stools.  Followup with me in [place]  Signed, Fransico Him, MD 12/28/2012 11:50 AM

## 2012-12-28 NOTE — Patient Instructions (Signed)
Stop HCTZ Start Eliquis 2.5 MG Twice a day Start Toprol 25 MG 1/2 Tablet daily  Your physician recommends that you return for lab work in: One week on 01/04/13 for a BMET  Your physician recommends that you schedule a follow-up appointment in: 4 [place] with Dr. [name]

## 2012-12-29 ENCOUNTER — Telehealth: Payer: Self-pay | Admitting: *Deleted

## 2012-12-29 NOTE — Telephone Encounter (Signed)
PA request for eliquis throught [facility] approved through 12/28/2012 , [place] [id number]

## 2013-01-04 ENCOUNTER — Other Ambulatory Visit (INDEPENDENT_AMBULATORY_CARE_PROVIDER_SITE_OTHER): Payer: Medicare Other

## 2013-01-04 ENCOUNTER — Telehealth: Payer: Self-pay | Admitting: *Deleted

## 2013-01-04 DIAGNOSIS — I480 Paroxysmal atrial fibrillation: Secondary | ICD-10-CM

## 2013-01-04 DIAGNOSIS — N289 Disorder of kidney and ureter, unspecified: Secondary | ICD-10-CM

## 2013-01-04 DIAGNOSIS — I10 Essential (primary) hypertension: Secondary | ICD-10-CM

## 2013-01-04 DIAGNOSIS — I4891 Unspecified atrial fibrillation: Secondary | ICD-10-CM | POA: Diagnosis not present

## 2013-01-04 LAB — BASIC METABOLIC PANEL
CO2: 26 mEq/L (ref 19–32)
Calcium: 9.8 mg/dL (ref 8.4–10.5)
Chloride: 102 mEq/L (ref 96–112)
GFR: 13.37 mL/min — CL (ref >60.00)
Glucose, Bld: 120 mg/dL — ABNORMAL HIGH (ref 70–99)
Potassium: 4.1 mEq/L (ref 3.5–5.1)
Sodium: 142 mEq/L (ref 135–145)

## 2013-01-04 NOTE — Telephone Encounter (Signed)
Received call from lab with critical BMET.   BUN 35; Creat 3.5; GFR 13.37.  Spoke w/ Dr. Burt Knack (DOD) who suggests stopping Lotensin; set up referral to Nephrology; repeat BMET in 2 wks.; and increase fluid intake.  Spoke w/ pt who states she doesn't have a nephrologist.  She is seeing a new PCP tomorrow; Dr. Maurice Small. States she just doesn't "feel good". Advised to stop her Lotensin and will repeat labs in 2 wks.  Also she will get a call to set up appointment with nephrologist. Rosezena Sensor understanding and if has questions she will call.

## 2013-01-05 ENCOUNTER — Telehealth: Payer: Self-pay | Admitting: General Surgery

## 2013-01-05 DIAGNOSIS — E039 Hypothyroidism, unspecified: Secondary | ICD-10-CM | POA: Diagnosis not present

## 2013-01-05 DIAGNOSIS — Z79899 Other long term (current) drug therapy: Secondary | ICD-10-CM

## 2013-01-05 DIAGNOSIS — I10 Essential (primary) hypertension: Secondary | ICD-10-CM | POA: Diagnosis not present

## 2013-01-05 DIAGNOSIS — E1129 Type 2 diabetes mellitus with other diabetic kidney complication: Secondary | ICD-10-CM | POA: Diagnosis not present

## 2013-01-05 NOTE — Telephone Encounter (Signed)
Pt is aware. She will come in tomorrow.

## 2013-01-05 NOTE — Progress Notes (Signed)
PT is aware and coming in tomorrow.

## 2013-01-05 NOTE — Telephone Encounter (Signed)
Message copied by Lily Kocher on Thu Jan 05, 2013  5:21 PM ------      Message from: Fransico Him R      Created: Thu Jan 05, 2013  5:15 PM       Please have her come back in tomorrow to repeat BMET.  Her creatinine clearance is 65ml/min and she is on Eliquis 2.5mg  BID ------

## 2013-01-06 ENCOUNTER — Inpatient Hospital Stay (HOSPITAL_COMMUNITY)
Admission: EM | Admit: 2013-01-06 | Discharge: 2013-01-20 | DRG: 682 | Disposition: A | Discharge status: Home / Self Care | Payer: Medicare Other | Attending: Internal Medicine | Admitting: Internal Medicine

## 2013-01-06 ENCOUNTER — Encounter (HOSPITAL_COMMUNITY): Payer: Self-pay | Admitting: Emergency Medicine

## 2013-01-06 ENCOUNTER — Other Ambulatory Visit (INDEPENDENT_AMBULATORY_CARE_PROVIDER_SITE_OTHER): Payer: Medicare Other

## 2013-01-06 DIAGNOSIS — M7989 Other specified soft tissue disorders: Secondary | ICD-10-CM

## 2013-01-06 DIAGNOSIS — F329 Major depressive disorder, single episode, unspecified: Secondary | ICD-10-CM | POA: Diagnosis present

## 2013-01-06 DIAGNOSIS — I5033 Acute on chronic diastolic (congestive) heart failure: Secondary | ICD-10-CM

## 2013-01-06 DIAGNOSIS — N183 Chronic kidney disease, stage 3 unspecified: Secondary | ICD-10-CM | POA: Diagnosis present

## 2013-01-06 DIAGNOSIS — E119 Type 2 diabetes mellitus without complications: Secondary | ICD-10-CM | POA: Diagnosis present

## 2013-01-06 DIAGNOSIS — I2789 Other specified pulmonary heart diseases: Secondary | ICD-10-CM | POA: Diagnosis present

## 2013-01-06 DIAGNOSIS — C9000 Multiple myeloma not having achieved remission: Secondary | ICD-10-CM | POA: Diagnosis present

## 2013-01-06 DIAGNOSIS — I959 Hypotension, unspecified: Secondary | ICD-10-CM | POA: Diagnosis not present

## 2013-01-06 DIAGNOSIS — I129 Hypertensive chronic kidney disease with stage 1 through stage 4 chronic kidney disease, or unspecified chronic kidney disease: Secondary | ICD-10-CM | POA: Diagnosis not present

## 2013-01-06 DIAGNOSIS — E039 Hypothyroidism, unspecified: Secondary | ICD-10-CM | POA: Diagnosis present

## 2013-01-06 DIAGNOSIS — Z853 Personal history of malignant neoplasm of breast: Secondary | ICD-10-CM

## 2013-01-06 DIAGNOSIS — I480 Paroxysmal atrial fibrillation: Secondary | ICD-10-CM

## 2013-01-06 DIAGNOSIS — N189 Chronic kidney disease, unspecified: Secondary | ICD-10-CM | POA: Diagnosis not present

## 2013-01-06 DIAGNOSIS — I509 Heart failure, unspecified: Secondary | ICD-10-CM | POA: Diagnosis present

## 2013-01-06 DIAGNOSIS — J90 Pleural effusion, not elsewhere classified: Secondary | ICD-10-CM | POA: Diagnosis not present

## 2013-01-06 DIAGNOSIS — M810 Age-related osteoporosis without current pathological fracture: Secondary | ICD-10-CM | POA: Diagnosis present

## 2013-01-06 DIAGNOSIS — I4892 Unspecified atrial flutter: Secondary | ICD-10-CM | POA: Diagnosis present

## 2013-01-06 DIAGNOSIS — I359 Nonrheumatic aortic valve disorder, unspecified: Secondary | ICD-10-CM | POA: Diagnosis not present

## 2013-01-06 DIAGNOSIS — I5032 Chronic diastolic (congestive) heart failure: Secondary | ICD-10-CM | POA: Diagnosis not present

## 2013-01-06 DIAGNOSIS — D631 Anemia in chronic kidney disease: Secondary | ICD-10-CM | POA: Diagnosis not present

## 2013-01-06 DIAGNOSIS — N2581 Secondary hyperparathyroidism of renal origin: Secondary | ICD-10-CM | POA: Diagnosis present

## 2013-01-06 DIAGNOSIS — J441 Chronic obstructive pulmonary disease with (acute) exacerbation: Secondary | ICD-10-CM

## 2013-01-06 DIAGNOSIS — E785 Hyperlipidemia, unspecified: Secondary | ICD-10-CM | POA: Diagnosis present

## 2013-01-06 DIAGNOSIS — I739 Peripheral vascular disease, unspecified: Secondary | ICD-10-CM | POA: Diagnosis present

## 2013-01-06 DIAGNOSIS — Z87891 Personal history of nicotine dependence: Secondary | ICD-10-CM

## 2013-01-06 DIAGNOSIS — F3289 Other specified depressive episodes: Secondary | ICD-10-CM | POA: Diagnosis present

## 2013-01-06 DIAGNOSIS — K219 Gastro-esophageal reflux disease without esophagitis: Secondary | ICD-10-CM | POA: Diagnosis present

## 2013-01-06 DIAGNOSIS — E669 Obesity, unspecified: Secondary | ICD-10-CM | POA: Diagnosis present

## 2013-01-06 DIAGNOSIS — N390 Urinary tract infection, site not specified: Secondary | ICD-10-CM | POA: Diagnosis present

## 2013-01-06 DIAGNOSIS — D649 Anemia, unspecified: Secondary | ICD-10-CM | POA: Diagnosis not present

## 2013-01-06 DIAGNOSIS — B961 Klebsiella pneumoniae [K. pneumoniae] as the cause of diseases classified elsewhere: Secondary | ICD-10-CM | POA: Diagnosis present

## 2013-01-06 DIAGNOSIS — I482 Chronic atrial fibrillation, unspecified: Secondary | ICD-10-CM | POA: Diagnosis present

## 2013-01-06 DIAGNOSIS — J439 Emphysema, unspecified: Secondary | ICD-10-CM

## 2013-01-06 DIAGNOSIS — J9620 Acute and chronic respiratory failure, unspecified whether with hypoxia or hypercapnia: Secondary | ICD-10-CM | POA: Diagnosis present

## 2013-01-06 DIAGNOSIS — T148XXA Other injury of unspecified body region, initial encounter: Secondary | ICD-10-CM

## 2013-01-06 DIAGNOSIS — T380X5A Adverse effect of glucocorticoids and synthetic analogues, initial encounter: Secondary | ICD-10-CM | POA: Diagnosis present

## 2013-01-06 DIAGNOSIS — I10 Essential (primary) hypertension: Secondary | ICD-10-CM | POA: Diagnosis not present

## 2013-01-06 DIAGNOSIS — J438 Other emphysema: Secondary | ICD-10-CM | POA: Diagnosis not present

## 2013-01-06 DIAGNOSIS — I120 Hypertensive chronic kidney disease with stage 5 chronic kidney disease or end stage renal disease: Secondary | ICD-10-CM | POA: Diagnosis not present

## 2013-01-06 DIAGNOSIS — J9601 Acute respiratory failure with hypoxia: Secondary | ICD-10-CM

## 2013-01-06 DIAGNOSIS — Z7901 Long term (current) use of anticoagulants: Secondary | ICD-10-CM

## 2013-01-06 DIAGNOSIS — R918 Other nonspecific abnormal finding of lung field: Secondary | ICD-10-CM

## 2013-01-06 DIAGNOSIS — I4891 Unspecified atrial fibrillation: Secondary | ICD-10-CM | POA: Diagnosis not present

## 2013-01-06 DIAGNOSIS — N184 Chronic kidney disease, stage 4 (severe): Secondary | ICD-10-CM | POA: Diagnosis present

## 2013-01-06 DIAGNOSIS — I350 Nonrheumatic aortic (valve) stenosis: Secondary | ICD-10-CM

## 2013-01-06 DIAGNOSIS — I272 Pulmonary hypertension, unspecified: Secondary | ICD-10-CM

## 2013-01-06 DIAGNOSIS — I70219 Atherosclerosis of native arteries of extremities with intermittent claudication, unspecified extremity: Secondary | ICD-10-CM

## 2013-01-06 DIAGNOSIS — N179 Acute kidney failure, unspecified: Secondary | ICD-10-CM | POA: Diagnosis not present

## 2013-01-06 DIAGNOSIS — J9600 Acute respiratory failure, unspecified whether with hypoxia or hypercapnia: Secondary | ICD-10-CM | POA: Diagnosis not present

## 2013-01-06 DIAGNOSIS — Z79899 Other long term (current) drug therapy: Secondary | ICD-10-CM

## 2013-01-06 DIAGNOSIS — E079 Disorder of thyroid, unspecified: Secondary | ICD-10-CM | POA: Diagnosis present

## 2013-01-06 DIAGNOSIS — R600 Localized edema: Secondary | ICD-10-CM

## 2013-01-06 DIAGNOSIS — D509 Iron deficiency anemia, unspecified: Secondary | ICD-10-CM | POA: Diagnosis present

## 2013-01-06 DIAGNOSIS — E1129 Type 2 diabetes mellitus with other diabetic kidney complication: Secondary | ICD-10-CM | POA: Diagnosis not present

## 2013-01-06 LAB — CBC WITH DIFFERENTIAL/PLATELET
Basophils Relative: 1 % (ref 0–1)
Eosinophils Absolute: 0.1 10*3/uL (ref 0.0–0.7)
HCT: 29.1 % — ABNORMAL LOW (ref 36.0–46.0)
Hemoglobin: 9.5 g/dL — ABNORMAL LOW (ref 12.0–15.0)
Lymphocytes Relative: 23 % (ref 12–46)
MCHC: 32.6 g/dL (ref 30.0–36.0)
MCV: 95.4 fL (ref 78.0–100.0)
Monocytes Absolute: 0.3 10*3/uL (ref 0.1–1.0)
Monocytes Relative: 6 % (ref 3–12)
Neutro Abs: 2.8 10*3/uL (ref 1.7–7.7)
Neutrophils Relative %: 68 % (ref 43–77)
RBC: 3.05 MIL/uL — ABNORMAL LOW (ref 3.87–5.11)
WBC: 4.2 10*3/uL (ref 4.0–10.5)

## 2013-01-06 LAB — BASIC METABOLIC PANEL
BUN: 41 mg/dL — ABNORMAL HIGH (ref 6–23)
CO2: 26 mEq/L (ref 19–32)
Chloride: 101 mEq/L (ref 96–112)
Creatinine, Ser: 4.2 mg/dL — ABNORMAL HIGH (ref 0.4–1.2)
Glucose, Bld: 123 mg/dL — ABNORMAL HIGH (ref 70–99)

## 2013-01-06 LAB — COMPREHENSIVE METABOLIC PANEL
ALT: 14 U/L (ref 0–35)
Albumin: 3.5 g/dL (ref 3.5–5.2)
BUN: 45 mg/dL — ABNORMAL HIGH (ref 6–23)
CO2: 26 mEq/L (ref 19–32)
Calcium: 10.3 mg/dL (ref 8.4–10.5)
Creatinine, Ser: 4.09 mg/dL — ABNORMAL HIGH (ref 0.50–1.10)
GFR calc Af Amer: 11 mL/min — ABNORMAL LOW (ref >90)
GFR calc non Af Amer: 10 mL/min — ABNORMAL LOW (ref >90)
Glucose, Bld: 128 mg/dL — ABNORMAL HIGH (ref 70–99)
Sodium: 144 mEq/L (ref 135–145)
Total Protein: 6.5 g/dL (ref 6.0–8.3)

## 2013-01-06 NOTE — ED Notes (Signed)
Pt states that she went to her PCP on the 29th and she had leb work done. Pt states that she had lab work done today as well at [facility] office. The Lab work from today shows that she has an elevated BUN and Creatine (more than the 29th.) so pt was told to come and be seen in the E.D. Pt has no symptoms, denies SOB, denies Pain.

## 2013-01-06 NOTE — ED Provider Notes (Signed)
CSN: [id number]     Arrival date & time 01/06/13  2157 History   First MD Initiated Contact with Patient 01/06/13 2326     Chief Complaint  Patient presents with  . Abnormal Lab   (Consider location/radiation/quality/duration/timing/severity/associated sxs/prior Treatment) The history is provided by the patient.   79 year old female states at that for the last several days to week, she has felt generally weak and has had little appetite. She states that food doesn't taste right. She denies fever or chills. She denies hurting anywhere but states that she just doesn't have any energy. She states that she's been having normal bowel movements and urinating normally. She had lab work 2 days ago showing worsening renal function and had lab work repeated today showing further worsening and was told to come to the ED. She had been on furosemide and benazepril, both of which were stopped 2 days ago. She has recently started on apixoban, and denies any other new medications.  Past Medical History  Diagnosis Date  . Diabetes mellitus   . Hypertension   . Leg pain   . Peripheral vascular disease   . Hyperlipidemia   . Hemorrhoids   . Ulcer 2009    gatric with history of bleeding - resolved  . COPD (chronic obstructive pulmonary disease)   . Cancer 1994    Left Breast  . Carotid artery occlusion     mild bilateral  . Anxiety   . Central retinal vein occlusion, right eye   . Anemia   . Chronic kidney disease     CKD stage III  . Atrial flutter 2008    remote  . Thyroid disease     toxic multinodular goiter with history of thyrotoxicosis now s/p RAI ablative therapy - now hypothyroid  . Depression   . Peripheral neuropathy   . GERD (gastroesophageal reflux disease)   . Obesity   . Hiatal hernia   . Schatzki's ring   . Cholelithiases   . Anorexia nervosa   . Chronic diastolic CHF (congestive heart failure)   . Aortic stenosis, mild     echo 12/2012  . Pulmonary HTN     mild PASP  50mmHg by echo 12/2012   Past Surgical History  Procedure Laterality Date  . Wrist surgery  02/2009  . Cholecystectomy      Gall Bladder  . Pr vein bypass graft,aorto-fem-pop    . Abdominal hysterectomy      with BSO  . Carotid endarterectomy    . Breast lumpectomy    . Abdominal aortagram  Jan. 18, 2013  . Hip surgery Left    Family History  Problem Relation Age of Onset  . Cancer Mother     stomach  . Cancer Father     liver  . Cirrhosis Father   . Cancer Sister    History  Substance Use Topics  . Smoking status: Former Smoker -- 1.00 packs/day for 45 years    Types: Cigarettes    Quit date: 01/26/1997  . Smokeless tobacco: Never Used  . Alcohol Use: No   OB History   Grav Para Term Preterm Abortions TAB SAB Ect Mult Living                 Review of Systems  All other systems reviewed and are negative.    Allergies  Metformin and related and Omnipaque  Home Medications   Current Outpatient Rx  Name  Route  Sig  Dispense  Refill  .  amLODipine (NORVASC) 10 MG tablet   Oral   Take 10 mg by mouth daily.           Marland Kitchen apixaban (ELIQUIS) 2.5 MG TABS tablet   Oral   Take 1 tablet (2.5 mg total) by mouth 2 (two) times daily.   60 tablet   11   . calcium-vitamin D (OSCAL 500/200 D-3) 500-200 MG-UNIT per tablet   Oral   Take 1 tablet by mouth 2 (two) times daily.           Marland Kitchen doxazosin (CARDURA) 2 MG tablet   Oral   Take 6 mg by mouth at bedtime.         . Ferrous Sulfate (IRON) 325 (65 FE) MG TABS   Oral   Take 325 mg by mouth 3 (three) times daily.          . fish oil-omega-3 fatty acids 1000 MG capsule   Oral   Take 1 g by mouth 2 (two) times daily.           Marland Kitchen levothyroxine (SYNTHROID, LEVOTHROID) 50 MCG tablet   Oral   Take 50 mcg by mouth daily before breakfast.         . metoprolol succinate (TOPROL-XL) 25 MG 24 hr tablet      1/2 tablet daily   15 tablet   11   . mirtazapine (REMERON) 15 MG tablet   Oral   Take 15 mg by  mouth every morning.          Marland Kitchen omeprazole (PRILOSEC) 20 MG capsule   Oral   Take 20 mg by mouth daily.           . pioglitazone (ACTOS) 30 MG tablet   Oral   Take 30 mg by mouth daily.         . simvastatin (ZOCOR) 20 MG tablet   Oral   Take 20 mg by mouth at bedtime.           . sitaGLIPtin (JANUVIA) 100 MG tablet   Oral   Take 50 mg by mouth daily.           Marland Kitchen Umeclidinium-Vilanterol (ANORO ELLIPTA) 62.5-25 MCG/INH AEPB   Inhalation   Inhale 25 mcg into the lungs daily as needed (shortness of breath).          BP 115/53  Pulse 88  Resp 16  SpO2 97% Physical Exam  Nursing note and vitals reviewed.  79 year old female, resting comfortably and in no acute distress. Vital signs are normal. Oxygen saturation is 97%, which is normal. Head is normocephalic and atraumatic. PERRLA, EOMI. Oropharynx is clear. Neck is nontender and supple without adenopathy or JVD. Back is nontender and there is no CVA tenderness. There is trace presacral edema. Lungs are clear without rales, wheezes, or rhonchi. Chest is nontender. Heart has regular rate and rhythm without murmur. Abdomen is soft, flat, nontender without masses or hepatosplenomegaly and peristalsis is normoactive. Extremities have 2+ pretibial edema, full range of motion is present. Skin is warm and dry without rash. Neurologic: Mental status is normal, cranial nerves are intact, there are no motor or sensory deficits.  ED Course  Procedures (including critical care time) Labs Review Results for orders placed during the hospital encounter of 01/06/13  COMPREHENSIVE METABOLIC PANEL      Result Value Range   Sodium 144  135 - 145 mEq/L   Potassium 4.3  3.5 - 5.1 mEq/L   Chloride  103  96 - 112 mEq/L   CO2 26  19 - 32 mEq/L   Glucose, Bld 128 (*) 70 - 99 mg/dL   BUN 45 (*) 6 - 23 mg/dL   Creatinine, Ser 4.09 (*) 0.50 - 1.10 mg/dL   Calcium 10.3  8.4 - 10.5 mg/dL   Total Protein 6.5  6.0 - 8.3 g/dL   Albumin  3.5  3.5 - 5.2 g/dL   AST 21  0 - 37 U/L   ALT 14  0 - 35 U/L   Alkaline Phosphatase 51  39 - 117 U/L   Total Bilirubin 0.3  0.3 - 1.2 mg/dL   GFR calc non Af Amer 10 (*) >90 mL/min   GFR calc Af Amer 11 (*) >90 mL/min  CBC WITH DIFFERENTIAL      Result Value Range   WBC 4.2  4.0 - 10.5 K/uL   RBC 3.05 (*) 3.87 - 5.11 MIL/uL   Hemoglobin 9.5 (*) 12.0 - 15.0 g/dL   HCT 29.1 (*) 36.0 - 46.0 %   MCV 95.4  78.0 - 100.0 fL   MCH 31.1  26.0 - 34.0 pg   MCHC 32.6  30.0 - 36.0 g/dL   RDW 14.0  11.5 - 15.5 %   Platelets 153  150 - 400 K/uL   Neutrophils Relative % 68  43 - 77 %   Neutro Abs 2.8  1.7 - 7.7 K/uL   Lymphocytes Relative 23  12 - 46 %   Lymphs Abs 1.0  0.7 - 4.0 K/uL   Monocytes Relative 6  3 - 12 %   Monocytes Absolute 0.3  0.1 - 1.0 K/uL   Eosinophils Relative 2  0 - 5 %   Eosinophils Absolute 0.1  0.0 - 0.7 K/uL   Basophils Relative 1  0 - 1 %   Basophils Absolute 0.0  0.0 - 0.1 K/uL   MDM   1. Acute on chronic renal failure   2. Chronic diastolic heart failure   3. Anemia    Acute on chronic renal failure. Old records were reviewed and her creatinine had been 1.64 Calef ago, 3.52 days ago, and is 4.1 today. She is noted to be anemic, but this preceded the worsening of her renal function and is stable. A review of her current medications showed nothing that is typically associated with nephrotoxicity or renal failure. Case is discussed with Dr. Arnoldo Morale of [facility] who agrees to admit the patient.    Delora Fuel, MD [phone] [id number]

## 2013-01-06 NOTE — Telephone Encounter (Signed)
Today's labs BUN 41, Creatinine 4.2 higher that the results on 10/29 "BUN 35, creatinine 3.5" GFR 10.78 from 13.37  Lower than 13.37 on  10/29. Dr. [place] DOD spoke with Dr. Florene Glen at [facility]. Nephrologist   recommends for pt to stop Lotensin "pt stop on 10/29 th" and lasix medication. Pt is to have repeat BMET on Monday. If pt does not feel well, pt is to go to the ER. Pt was called and is aware of results and recommendations. Pt states she is not feeling worse than two days ago. She will do what MD recommends and come Monday for labs. Pt takes Lasix 3 times a week with the potassium. Pt  already took the Lasix this AM. Pt is aware to hold the potassium also. Pt verbalized understanding.

## 2013-01-06 NOTE — Telephone Encounter (Signed)
Instructed patient to go to ER and patient agreed to go

## 2013-01-07 ENCOUNTER — Encounter (HOSPITAL_COMMUNITY): Payer: Self-pay | Admitting: Urology

## 2013-01-07 DIAGNOSIS — R609 Edema, unspecified: Secondary | ICD-10-CM

## 2013-01-07 DIAGNOSIS — I5032 Chronic diastolic (congestive) heart failure: Secondary | ICD-10-CM

## 2013-01-07 DIAGNOSIS — I10 Essential (primary) hypertension: Secondary | ICD-10-CM

## 2013-01-07 DIAGNOSIS — N179 Acute kidney failure, unspecified: Principal | ICD-10-CM

## 2013-01-07 DIAGNOSIS — N189 Chronic kidney disease, unspecified: Secondary | ICD-10-CM | POA: Diagnosis present

## 2013-01-07 DIAGNOSIS — E079 Disorder of thyroid, unspecified: Secondary | ICD-10-CM

## 2013-01-07 DIAGNOSIS — J438 Other emphysema: Secondary | ICD-10-CM

## 2013-01-07 DIAGNOSIS — I4891 Unspecified atrial fibrillation: Secondary | ICD-10-CM

## 2013-01-07 DIAGNOSIS — D649 Anemia, unspecified: Secondary | ICD-10-CM

## 2013-01-07 DIAGNOSIS — I739 Peripheral vascular disease, unspecified: Secondary | ICD-10-CM

## 2013-01-07 LAB — BASIC METABOLIC PANEL
BUN: 45 mg/dL — ABNORMAL HIGH (ref 6–23)
CO2: 26 mEq/L (ref 19–32)
Calcium: 10 mg/dL (ref 8.4–10.5)
Creatinine, Ser: 4.16 mg/dL — ABNORMAL HIGH (ref 0.50–1.10)
Glucose, Bld: 104 mg/dL — ABNORMAL HIGH (ref 70–99)
Potassium: 3.9 mEq/L (ref 3.5–5.1)
Sodium: 141 mEq/L (ref 135–145)

## 2013-01-07 LAB — CBC
HCT: 26.6 % — ABNORMAL LOW (ref 36.0–46.0)
MCH: 31.8 pg (ref 26.0–34.0)
MCV: 95 fL (ref 78.0–100.0)
Platelets: 139 10*3/uL — ABNORMAL LOW (ref 150–400)
RDW: 13.9 % (ref 11.5–15.5)

## 2013-01-07 LAB — GLUCOSE, CAPILLARY
Glucose-Capillary: 100 mg/dL — ABNORMAL HIGH (ref 70–99)
Glucose-Capillary: 135 mg/dL — ABNORMAL HIGH (ref 70–99)
Glucose-Capillary: 127 mg/dL — ABNORMAL HIGH (ref 70–99)

## 2013-01-07 LAB — URINALYSIS, ROUTINE W REFLEX MICROSCOPIC
Glucose, UA: NEGATIVE mg/dL
Hgb urine dipstick: NEGATIVE
Ketones, ur: NEGATIVE mg/dL
Protein, ur: 30 mg/dL — AB

## 2013-01-07 LAB — HEMOGLOBIN A1C
Hgb A1c MFr Bld: 5.7 % — ABNORMAL HIGH (ref <5.7)
Mean Plasma Glucose: 117 mg/dL — ABNORMAL HIGH (ref <117)

## 2013-01-07 LAB — URINE MICROSCOPIC-ADD ON

## 2013-01-07 MED ORDER — AMLODIPINE BESYLATE 10 MG PO TABS
10.0000 mg | ORAL_TABLET | Freq: Every day | ORAL | Status: DC
  Filled 2013-01-07: qty 1

## 2013-01-07 MED ORDER — FERROUS SULFATE 325 (65 FE) MG PO TABS
325.0000 mg | ORAL_TABLET | Freq: Three times a day (TID) | ORAL | Status: DC
  Administered 2013-01-07 – 2013-01-20 (×41): 325 mg via ORAL
  Filled 2013-01-07 (×43): qty 1

## 2013-01-07 MED ORDER — MIRTAZAPINE 15 MG PO TABS
15.0000 mg | ORAL_TABLET | Freq: Every day | ORAL | Status: DC
  Administered 2013-01-07 – 2013-01-20 (×14): 15 mg via ORAL
  Filled 2013-01-07 (×14): qty 1

## 2013-01-07 MED ORDER — SODIUM CHLORIDE 0.9 % IV SOLN
INTRAVENOUS | Status: DC
  Administered 2013-01-07 – 2013-01-09 (×7): via INTRAVENOUS

## 2013-01-07 MED ORDER — ONDANSETRON HCL 4 MG/2ML IJ SOLN: 4.0000 mg | Freq: Four times a day (QID) | INTRAMUSCULAR | Status: DC | PRN

## 2013-01-07 MED ORDER — OXYCODONE HCL 5 MG PO TABS: 5.0000 mg | ORAL_TABLET | ORAL | Status: DC | PRN

## 2013-01-07 MED ORDER — OMEGA-3 FATTY ACIDS 1000 MG PO CAPS: 1.0000 g | ORAL_CAPSULE | Freq: Two times a day (BID) | ORAL | Status: DC

## 2013-01-07 MED ORDER — OMEGA-3-ACID ETHYL ESTERS 1 G PO CAPS
1.0000 g | ORAL_CAPSULE | Freq: Two times a day (BID) | ORAL | Status: DC
  Administered 2013-01-07 – 2013-01-20 (×27): 1 g via ORAL
  Filled 2013-01-07 (×28): qty 1

## 2013-01-07 MED ORDER — IRON 325 (65 FE) MG PO TABS: 325.0000 mg | ORAL_TABLET | Freq: Three times a day (TID) | ORAL | Status: DC

## 2013-01-07 MED ORDER — APIXABAN 2.5 MG PO TABS
2.5000 mg | ORAL_TABLET | Freq: Two times a day (BID) | ORAL | Status: DC
  Administered 2013-01-07 – 2013-01-08 (×3): 2.5 mg via ORAL
  Filled 2013-01-07 (×4): qty 1

## 2013-01-07 MED ORDER — ACETAMINOPHEN 325 MG PO TABS: 650.0000 mg | ORAL_TABLET | Freq: Four times a day (QID) | ORAL | Status: DC | PRN

## 2013-01-07 MED ORDER — CALCIUM CARBONATE-VITAMIN D 500-200 MG-UNIT PO TABS
1.0000 | ORAL_TABLET | Freq: Two times a day (BID) | ORAL | Status: DC
  Filled 2013-01-07 (×2): qty 1

## 2013-01-07 MED ORDER — INSULIN ASPART 100 UNIT/ML ~~LOC~~ SOLN
0.0000 [IU] | Freq: Three times a day (TID) | SUBCUTANEOUS | Status: DC
  Administered 2013-01-07: 14:00:00 1 [IU] via SUBCUTANEOUS
  Administered 2013-01-10: 18:00:00 2 [IU] via SUBCUTANEOUS
  Administered 2013-01-11: 06:00:00 3 [IU] via SUBCUTANEOUS
  Administered 2013-01-11: 12:00:00 7 [IU] via SUBCUTANEOUS
  Administered 2013-01-11: 3 [IU] via SUBCUTANEOUS
  Administered 2013-01-12 (×2): 2 [IU] via SUBCUTANEOUS
  Administered 2013-01-12: 18:00:00 7 [IU] via SUBCUTANEOUS
  Administered 2013-01-13: 13:00:00 2 [IU] via SUBCUTANEOUS
  Administered 2013-01-13: 17:00:00 9 [IU] via SUBCUTANEOUS
  Administered 2013-01-14: 06:00:00 1 [IU] via SUBCUTANEOUS
  Administered 2013-01-14 (×2): 5 [IU] via SUBCUTANEOUS
  Administered 2013-01-15: 06:00:00 2 [IU] via SUBCUTANEOUS
  Administered 2013-01-15: 16:00:00 9 [IU] via SUBCUTANEOUS
  Administered 2013-01-15: 12:00:00 3 [IU] via SUBCUTANEOUS
  Administered 2013-01-16: 5 [IU] via SUBCUTANEOUS
  Administered 2013-01-16: 08:00:00 2 [IU] via SUBCUTANEOUS
  Administered 2013-01-16: 7 [IU] via SUBCUTANEOUS
  Administered 2013-01-17: 5 [IU] via SUBCUTANEOUS
  Administered 2013-01-17: 3 [IU] via SUBCUTANEOUS

## 2013-01-07 MED ORDER — ACETAMINOPHEN 650 MG RE SUPP: 650.0000 mg | Freq: Four times a day (QID) | RECTAL | Status: DC | PRN

## 2013-01-07 MED ORDER — HYDROMORPHONE HCL PF 1 MG/ML IJ SOLN: 0.5000 mg | INTRAMUSCULAR | Status: DC | PRN

## 2013-01-07 MED ORDER — LEVOTHYROXINE SODIUM 50 MCG PO TABS
50.0000 ug | ORAL_TABLET | Freq: Every day | ORAL | Status: DC
  Administered 2013-01-07 – 2013-01-20 (×14): 50 ug via ORAL
  Filled 2013-01-07 (×15): qty 1

## 2013-01-07 MED ORDER — ZOLPIDEM TARTRATE 5 MG PO TABS: 5.0000 mg | ORAL_TABLET | Freq: Every evening | ORAL | Status: DC | PRN

## 2013-01-07 MED ORDER — PANTOPRAZOLE SODIUM 40 MG PO TBEC: 40.0000 mg | DELAYED_RELEASE_TABLET | Freq: Every day | ORAL | Status: DC

## 2013-01-07 MED ORDER — ONDANSETRON HCL 4 MG PO TABS: 4.0000 mg | ORAL_TABLET | Freq: Four times a day (QID) | ORAL | Status: DC | PRN

## 2013-01-07 MED ORDER — DOXAZOSIN MESYLATE 2 MG PO TABS
6.0000 mg | ORAL_TABLET | Freq: Every day | ORAL | Status: DC
  Filled 2013-01-07: qty 1

## 2013-01-07 MED ORDER — METOPROLOL SUCCINATE ER 25 MG PO TB24
25.0000 mg | ORAL_TABLET | Freq: Every day | ORAL | Status: DC
  Administered 2013-01-07: 25 mg via ORAL
  Filled 2013-01-07 (×2): qty 1

## 2013-01-07 MED ORDER — INSULIN ASPART 100 UNIT/ML ~~LOC~~ SOLN
0.0000 [IU] | Freq: Every day | SUBCUTANEOUS | Status: DC
  Administered 2013-01-10 – 2013-01-11 (×2): 2 [IU] via SUBCUTANEOUS

## 2013-01-07 MED ORDER — SIMVASTATIN 20 MG PO TABS
20.0000 mg | ORAL_TABLET | Freq: Every day | ORAL | Status: DC
  Filled 2013-01-07: qty 1

## 2013-01-07 NOTE — ED Notes (Signed)
Family at bedside. 

## 2013-01-07 NOTE — ED Notes (Signed)
Pt ambulated to restroom with one assist. Unable to collect UA

## 2013-01-07 NOTE — H&P (Signed)
[facility] History and Physical  Jackie Alvarez [id number] DOB: 1932-01-01 DOA: 01/06/2013  Referring physician: EDP PCP: Jonathon Bellows, MD  Specialists:   Chief Complaint: Worsening Kidney Numbers  HPI: Jackie Alvarez is a 79 y.o. female who presents to the ED after lab results this week revealed rapid worsening of her renal function  She had been seen in her PCP's office 3 days ago and had labs done which revealed an increase in her BUN and Cr to 35/3.5 and she was told to stop her Lotensin Rx, and her Lasix Rx.    She had a repeat check on Friday and the numbers were further elevated at  41/4.2  and she was referred to the ED for evalauation.   Her Creatinine 1 month ago had been 1.6.  She denies having any vomiting or diarrhea, but she reports having a loss of appetite due to a "bad taste" in her mouth for the past week.  She has had weakness and fatigue as well as malaise.   She denies having any fevers or chills.      Review of Systems: The patient denies anorexia, fever, chills, headaches, weight loss, vision loss, diplopia, dizziness, decreased hearing, rhinitis, hoarseness, chest pain, syncope, dyspnea on exertion, peripheral edema, balance deficits, cough, hemoptysis, abdominal pain, vomiting, diarrhea, constipation, hematemesis, melena, hematochezia, severe indigestion/heartburn, dysuria, hematuria, incontinence, suspicious skin lesions, transient blindness, difficulty walking, depression, unusual weight change, abnormal bleeding, enlarged lymph nodes, angioedema, and breast masses.    Past Medical History  Diagnosis Date  . Diabetes mellitus   . Hypertension   . Leg pain   . Peripheral vascular disease   . Hyperlipidemia   . Hemorrhoids   . Ulcer 2009    gatric with history of bleeding - resolved  . COPD (chronic obstructive pulmonary disease)   . Cancer 1994    Left Breast  . Carotid artery occlusion     mild bilateral  . Anxiety   . Central retinal vein  occlusion, right eye   . Anemia   . Chronic kidney disease     CKD stage III  . Atrial flutter 2008    remote  . Thyroid disease     toxic multinodular goiter with history of thyrotoxicosis now s/p RAI ablative therapy - now hypothyroid  . Depression   . Peripheral neuropathy   . GERD (gastroesophageal reflux disease)   . Obesity   . Hiatal hernia   . Schatzki's ring   . Cholelithiases   . Anorexia nervosa   . Chronic diastolic CHF (congestive heart failure)   . Aortic stenosis, mild     echo 12/2012  . Pulmonary HTN     mild PASP 3mmHg by echo 12/2012    Past Surgical History  Procedure Laterality Date  . Wrist surgery  02/2009  . Cholecystectomy      Gall Bladder  . Pr vein bypass graft,aorto-fem-pop    . Abdominal hysterectomy      with BSO  . Carotid endarterectomy    . Breast lumpectomy    . Abdominal aortagram  Jan. 18, 2013  . Hip surgery Left     Prior to Admission medications   Medication Sig Start Date End Date Taking? Authorizing Provider  amLODipine (NORVASC) 10 MG tablet Take 10 mg by mouth daily.     Yes Historical Provider, MD  apixaban (ELIQUIS) 2.5 MG TABS tablet Take 1 tablet (2.5 mg total) by mouth 2 (two) times daily. 12/28/12  Yes Sueanne Margarita, MD  calcium-vitamin D (OSCAL 500/200 D-3) 500-200 MG-UNIT per tablet Take 1 tablet by mouth 2 (two) times daily.     Yes Historical Provider, MD  doxazosin (CARDURA) 2 MG tablet Take 6 mg by mouth at bedtime. 10/10/12  Yes Historical Provider, MD  Ferrous Sulfate (IRON) 325 (65 FE) MG TABS Take 325 mg by mouth 3 (three) times daily.    Yes Historical Provider, MD  fish oil-omega-3 fatty acids 1000 MG capsule Take 1 g by mouth 2 (two) times daily.     Yes Historical Provider, MD  levothyroxine (SYNTHROID, LEVOTHROID) 50 MCG tablet Take 50 mcg by mouth daily before breakfast.   Yes Historical Provider, MD  metoprolol succinate (TOPROL-XL) 25 MG 24 hr tablet 1/2 tablet daily 12/28/12  Yes Sueanne Margarita, MD   mirtazapine (REMERON) 15 MG tablet Take 15 mg by mouth every morning.    Yes Historical Provider, MD  omeprazole (PRILOSEC) 20 MG capsule Take 20 mg by mouth daily.     Yes Historical Provider, MD  pioglitazone (ACTOS) 30 MG tablet Take 30 mg by mouth daily.   Yes Historical Provider, MD  simvastatin (ZOCOR) 20 MG tablet Take 20 mg by mouth at bedtime.     Yes Historical Provider, MD  sitaGLIPtin (JANUVIA) 100 MG tablet Take 50 mg by mouth daily.     Yes Historical Provider, MD  Umeclidinium-Vilanterol (ANORO ELLIPTA) 62.5-25 MCG/INH AEPB Inhale 25 mcg into the lungs daily as needed (shortness of breath).   Yes Historical Provider, MD    Allergies  Allergen Reactions  . Metformin And Related     Renal insuff  . Omnipaque [Iohexol] Nausea And Vomiting    unknown    Social History:  reports that she quit smoking about 15 years ago. Her smoking use included Cigarettes. She has a 45 pack-year smoking history. She has never used smokeless tobacco. She reports that she does not drink alcohol or use illicit drugs.     Family History  Problem Relation Age of Onset  . Cancer Mother     stomach  . Cancer Father     liver  . Cirrhosis Father   . Cancer Sister     Physical Exam:  GEN:  Pleasant  Well Nourished and Well Developed Elderly 79 y.o. Caucasin female  examined  and in no acute distress; cooperative with exam Filed Vitals:   01/07/13 0124 01/07/13 0139 01/07/13 0215 01/07/13 0238  BP: 117/43   122/91  Pulse: 63   70  Temp:  94.3 F (34.6 C) 94 F (34.4 C) 97.2 F (36.2 C)  TempSrc:  Rectal Rectal Oral  Resp: 16   18  Height:    5\' 7"  (1.702 m)  Weight:    86.7 kg (191 lb 2.2 oz)  SpO2: 96%   96%   Blood pressure 122/91, pulse 70, temperature 97.2 F (36.2 C), temperature source Oral, resp. rate 18, height 5\' 7"  (1.702 m), weight 86.7 kg (191 lb 2.2 oz), SpO2 96.00%. PSYCH: She is alert and oriented x4; does not appear anxious does not appear depressed; affect is  normal HEENT: Normocephalic and Atraumatic, Mucous membranes pink; PERRLA; EOM intact; Fundi:  Benign;  No scleral icterus, Nares: Patent, Oropharynx: Clear,  Fair Dentition, Neck:  FROM, no cervical lymphadenopathy nor thyromegaly or carotid bruit; no JVD; Breasts:: Not examined CHEST WALL: No tenderness CHEST: Normal respiration, clear to auscultation bilaterally HEART: Regular rate and rhythm; no murmurs rubs or gallops  BACK: No kyphosis or scoliosis; no CVA tenderness ABDOMEN: Positive Bowel Sounds,  soft non-tender; no masses, no organomegaly. Rectal Exam: Not done EXTREMITIES: No cyanosis, clubbing or edema; no ulcerations. Genitalia: not examined PULSES: 2+ and symmetric SKIN: Normal hydration no rash or ulceration CNS: Cranial nerves 2-12 grossly intact no focal neurologic deficit    Labs on Admission:  Basic Metabolic Panel:  Recent Labs Lab 01/04/13 1019 01/06/13 1024 01/06/13 2231  NA 142 140 144  K 4.1 4.1 4.3  CL 102 101 103  CO2 26 26 26   GLUCOSE 120* 123* 128*  BUN 35* 41* 45*  CREATININE 3.5* 4.2* 4.09*  CALCIUM 9.8 10.1 10.3   Liver Function Tests:  Recent Labs Lab 01/06/13 2231  AST 21  ALT 14  ALKPHOS 51  BILITOT 0.3  PROT 6.5  ALBUMIN 3.5   No results found for this basename: LIPASE, AMYLASE,  in the last 168 hours No results found for this basename: AMMONIA,  in the last 168 hours CBC:  Recent Labs Lab 01/06/13 2231  WBC 4.2  NEUTROABS 2.8  HGB 9.5*  HCT 29.1*  MCV 95.4  PLT 153   Cardiac Enzymes: No results found for this basename: CKTOTAL, CKMB, CKMBINDEX, TROPONINI,  in the last 168 hours  BNP (last 3 results) No results found for this basename: PROBNP,  in the last 8760 hours CBG: No results found for this basename: GLUCAP,  in the last 168 hours  Radiological Exams on Admission: No results found.   EKG: Independently reviewed.    Assessment/Plan Principal Problem:   Acute on chronic renal failure Active Problems:    Peripheral vascular disease, unspecified   COPD (chronic obstructive pulmonary disease) with emphysema   Thyroid disease   Anemia   Paroxysmal atrial fibrillation   HTN (hypertension)   Chronic diastolic heart failure    DM2   1.    Acute on Chronic Renal Failure-  Gentle IVFs, and Discontinue Ace Inhibitor Rx and Furosemide Rx.   Moniotr Bun/Cr.  May need Renal consult of not improving.    2.    Chronic Diastolic CHFf-  Monitor for Signs of Fluid Overload.    3.    Hypothyroid-  Continue Levothyroxine and check TSH Level.    4.    Paroxysmal Atria Fibrillation - on Metoprolol RX and Elliquis Rx.    5.    HTN-  Continue on Amlodipine, and Metoprolol Rx.    6.    COPD- stable   7.    DM2- on Januvia and Actos RX, Hold for now due to ARF, SSI coverage ordered PRN elevated glucose levels, and check HbA1c..    8.    Anemia-  Hx Iron deficiency Anemia, continue Iron Rx, check Anemia Panel.        Code Status:   FULL CODE Family Communication:  No Family Present Disposition Plan:    Inpatient  Time spent: [place] [facility] Pager [phone]  If 7PM-7AM, please contact night-coverage [email or web address] Password [facility] 01/07/2013, 3:48 AM

## 2013-01-07 NOTE — Progress Notes (Signed)
Pt arrived to floor in NAD. VSS. Pt alert and oriented. Pt educated to room and floor and verbalized understanding. Pt updated on plan of care. Will continue to monitor. Shelby Mattocks, RN

## 2013-01-07 NOTE — Progress Notes (Signed)
Jackie Alvarez [id number] DOB: [birth year] DOA: 01/06/2013 PCP: Maurice Small, Keturah Barre, MD  Brief narrative: 79 year old female, known history atrial fibrillation, chronic diastolic CHF ef [id number] [grade 2 dd] +mild AoS Echo 12/2012,  PVD, hypertension admitted from physician office w/ Acute Renal failure [basleine creat 1.6--->4.0], Nomocytic anemia  Past medical history-As per Problem list Chart reviewed as below- 10/21 -FEV1 percent of 63 10/22-c Afib Chad2vasc=5, recnetly started Eliquis 2.5 bid, Toprol Xl 12.5 bid Sees Hem for Anemia and TCP [work up for Multiple myeloma; haptoglobin, DAT, von Willebrand panel, PT, aPTT]? Admission 05/10/06 for AUGIB Admission 08/07/05 for Free Air Admission 05/18/02 for Multiple #'s Admission 11/17/99 for Ischemic L foot  Consultants:  Cardiology  Renal  Procedures:  none  Antibiotics:  none   Subjective  Doing well. States that she was recently diagnosed with atrial fibrillation in cardiologist office and started on Anticoagulation Currently passing urine. Had 2 episodes today which were not captured. Denies chest pain nausea vomiting shortness of breath Note that patient was slightly hypothermic on admission 94.3   Objective    Interim History:  none  Telemetry:  = atrial fibrillation, rate controlled, rate 70-90 per telemetry  Objective: Filed Vitals:   01/07/13 0139 01/07/13 0215 01/07/13 0238 01/07/13 0602  BP:   122/91 106/62  Pulse:   70 61  Temp: 94.3 F (34.6 C) 94 F (34.4 C) 97.2 F (36.2 C) 97.2 F (36.2 C)  TempSrc: Rectal Rectal Oral Oral  Resp:   18 17  Height:   5\' 7"  (1.702 m)   Weight:   86.7 kg (191 lb 2.2 oz)   SpO2:   96% 97%    Intake/Output Summary (Last 24 hours) at 01/07/13 0808 Last data filed at 01/07/13 0559  Gross per 24 hour  Intake 588.75 ml  Output    100 ml  Net 488.75 ml    Exam:  General:  EOMI, NCAT poor dentition Cardiovascular:  S1-S2 irregularly irregular rate  controlled Respiratory:  clinically clear Abdomen:  soft nontender nondistended SkinRight lower strength he seems larger than left, patient states she's had 10 surgeries in both legs over the years  NeuroRange of motion intact, neurologically intact, moving all 4 limbs equally  Data Reviewed: Basic Metabolic Panel:  Recent Labs Lab 01/04/13 1019 01/06/13 1024 01/06/13 2231 01/07/13 0405  NA 142 140 144 141  K 4.1 4.1 4.3 3.9  CL 102 101 103 102  CO2 26 26 26 26   GLUCOSE 120* 123* 128* 104*  BUN 35* 41* 45* 45*  CREATININE 3.5* 4.2* 4.09* 4.16*  CALCIUM 9.8 10.1 10.3 10.0   Liver Function Tests:  Recent Labs Lab 01/06/13 2231  AST 21  ALT 14  ALKPHOS 51  BILITOT 0.3  PROT 6.5  ALBUMIN 3.5   No results found for this basename: LIPASE, AMYLASE,  in the last 168 hours No results found for this basename: AMMONIA,  in the last 168 hours CBC:  Recent Labs Lab 01/06/13 2231 01/07/13 0405  WBC 4.2 4.0  NEUTROABS 2.8  --   HGB 9.5* 8.9*  HCT 29.1* 26.6*  MCV 95.4 95.0  PLT 153 139*   Cardiac Enzymes: No results found for this basename: CKTOTAL, CKMB, CKMBINDEX, TROPONINI,  in the last 168 hours BNP: No components found with this basename: POCBNP,  CBG:  Recent Labs Lab 01/07/13 0311  GLUCAP 115*    No results found for this or any previous visit (from the past 240 hour(s)).   Studies:  All Imaging reviewed and is as per above notation   Scheduled Meds: . amLODipine  10 mg Oral Daily  . apixaban  2.5 mg Oral BID  . calcium-vitamin D  1 tablet Oral BID  . doxazosin  6 mg Oral QHS  . ferrous sulfate  325 mg Oral TID WC  . insulin aspart  0-5 Units Subcutaneous QHS  . insulin aspart  0-9 Units Subcutaneous TID WC  . levothyroxine  50 mcg Oral QAC breakfast  . metoprolol succinate  25 mg Oral Daily  . mirtazapine  15 mg Oral Daily  . omega-3 acid ethyl esters  1 g Oral BID  . pantoprazole  40 mg Oral Daily  . simvastatin  20 mg Oral QHS    Continuous Infusions: . sodium chloride 75 mL/hr at 01/07/13 0120     Assessment/Plan: 1.  acute renal failure-likely secondary to iatrogenic medications inclusive of Lotensin, Lasix-baseline creatinine 1.6. Continue cautious IV repletion at 75 cc per hour, avoid nephrotoxins [discontinued Zocor, discontinue pantoprazole, discontinue amlodipine for now] 2. Atrial fibrillation CHad2Vasc2 score= 5-continue Apixaban renally dosed.  Defer further management to cardiology as an outpatient.  Rate control with metoprolol succinate 25 daily-monitor and increase if needed-discontinued amlodipine to allow this to be facilitated 3. Borderline hypotension-blood pressure is 122/90-monitor closely-already have discontinued amlodipine 10 mg, discontinue doxazosin 4. Hypothyroidism-continue levothyroxine 50 mcg every morning 5. Diabetes mellitus-HbA1c is pending-blood sugars 100 to 115-continue supplemental scale coverage 6. Osteoporosis-hold calcium vitamin D for now 7. Cryptogenic anemia-workup in process by oncology-continue ferrous sulfate 8. Hyperlipidemia-Hold omega-3 fatty acids for now 9.  Depression/sleep-continue mirtazapine 15 each bedtime  Code Status:  full Family Communication:  none at bedside Disposition Plan:  inpatient, 2-3 days   Verneita Griffes, MD  [facility] Pager [phone] 01/07/2013, 8:08 AM    LOS: 1 day

## 2013-01-07 NOTE — Progress Notes (Signed)
INITIAL NUTRITION ASSESSMENT  DOCUMENTATION CODES Per approved criteria  -Not Applicable   INTERVENTION: Encourage oral intake as able. Pt declined all oral nutrition supplements at this time. RD to continue to follow nutrition care plan.  NUTRITION DIAGNOSIS: Inadequate oral intake related to poor appetite and taste changes as evidenced by pt report.   Goal: Intake to meet >90% of estimated nutrition needs.  Monitor:  weight trends, lab trends, I/O's, PO intake  Reason for Assessment: Malnutrition Screening Tool  79 y.o. female  Admitting Dx: Acute on chronic renal failure  ASSESSMENT: PMHx significant for DM, HTN, CKD, COPD, CA, . Admitted with acute on chronic renal failure from MD office.  Noted pt with hx of anorexia nervosa per current PMH list - did not discuss this with patient.  Pt reports that she has had a bad taste in her mouth for the past week. She tells me that she has not been eating well for months but that she ate really well for her [employer] lunch today, consuming almost all of it (per her report.) Pt declined all oral nutrition supplements offered to her. Continues to note that she has a bad taste in her mouth.  Per MD note, ARF is likely 2/2 iatrogenic medications.  Pt is at nutrition risk given poor PO intake and chronic medical issues. Pt with 4% wt loss x 2 months - this is not significant.  Height: Ht Readings from Last 1 Encounters:  01/07/13 5\' 7"  (1.702 m)    Weight: Wt Readings from Last 1 Encounters:  01/07/13 191 lb 2.2 oz (86.7 kg)    Ideal Body Weight: 135 lb  % Ideal Body Weight: 141%  Wt Readings from Last 10 Encounters:  01/07/13 191 lb 2.2 oz (86.7 kg)  12/28/12 193 lb 6.4 oz (87.726 kg)  12/27/12 192 lb (87.091 kg)  11/24/12 203 lb 12.8 oz (92.443 kg)  11/21/12 204 lb 3.2 oz (92.625 kg)  11/14/12 206 lb 4.8 oz (93.577 kg)  10/31/12 204 lb 3.2 oz (92.625 kg)  04/12/12 200 lb (90.719 kg)  12/29/11 203 lb (92.08 kg)   10/06/11 204 lb (92.534 kg)    Usual Body Weight: 200 lb  % Usual Body Weight: 96%  BMI:  Body mass index is 29.93 kg/(m^2). Overweight  Estimated Nutritional Needs: Kcal: 1700 - 1900 Protein: 65 - 75 g Fluid: 1.7 - 1.9 liters  Skin: intact  Diet Order: Cardiac  EDUCATION NEEDS: -No education needs identified at this time   Intake/Output Summary (Last 24 hours) at 01/07/13 1338 Last data filed at 01/07/13 1016  Gross per 24 hour  Intake 828.75 ml  Output    100 ml  Net 728.75 ml    Last BM: 10/31  Labs:   Recent Labs Lab 01/06/13 1024 01/06/13 2231 01/07/13 0405  NA 140 144 141  K 4.1 4.3 3.9  CL 101 103 102  CO2 26 26 26   BUN 41* 45* 45*  CREATININE 4.2* 4.09* 4.16*  CALCIUM 10.1 10.3 10.0  GLUCOSE 123* 128* 104*    CBG (last 3)   Recent Labs  01/07/13 0311 01/07/13 0549 01/07/13 1105  GLUCAP 115* 100* 135*   Lab Results  Component Value Date   HGBA1C  Value: 8.2 (NOTE)   The ADA recommends the following therapeutic goals for glycemic   control related to Hgb A1C measurement:   Goal of Therapy:   < 7.0% Hgb A1C   Action Suggested:  > 8.0% Hgb A1C  Ref:  Diabetes Care, 22, Suppl. 1, 1999* 03/17/2007    Scheduled Meds: . apixaban  2.5 mg Oral BID  . ferrous sulfate  325 mg Oral TID WC  . insulin aspart  0-5 Units Subcutaneous QHS  . insulin aspart  0-9 Units Subcutaneous TID WC  . levothyroxine  50 mcg Oral QAC breakfast  . metoprolol succinate  25 mg Oral Daily  . mirtazapine  15 mg Oral Daily  . omega-3 acid ethyl esters  1 g Oral BID    Continuous Infusions: . sodium chloride 75 mL/hr at 01/07/13 0940    Past Medical History  Diagnosis Date  . Diabetes mellitus   . Hypertension   . Leg pain   . Peripheral vascular disease   . Hyperlipidemia   . Hemorrhoids   . Ulcer 2009    gatric with history of bleeding - resolved  . COPD (chronic obstructive pulmonary disease)   . Cancer 1994    Left Breast  . Carotid artery occlusion      mild bilateral  . Anxiety   . Central retinal vein occlusion, right eye   . Anemia   . Chronic kidney disease     CKD stage III  . Atrial flutter 2008    remote  . Thyroid disease     toxic multinodular goiter with history of thyrotoxicosis now s/p RAI ablative therapy - now hypothyroid  . Depression   . Peripheral neuropathy   . GERD (gastroesophageal reflux disease)   . Obesity   . Hiatal hernia   . Schatzki's ring   . Cholelithiases   . Anorexia nervosa   . Chronic diastolic CHF (congestive heart failure)   . Aortic stenosis, mild     echo 12/2012  . Pulmonary HTN     mild PASP 41mmHg by echo 12/2012    Past Surgical History  Procedure Laterality Date  . Wrist surgery  02/2009  . Cholecystectomy      Gall Bladder  . Pr vein bypass graft,aorto-fem-pop    . Abdominal hysterectomy      with BSO  . Carotid endarterectomy    . Breast lumpectomy    . Abdominal aortagram  Jan. 18, 2013  . Hip surgery Left     Inda Coke MS, RD, LDN Pager: [phone] After-hours pager: [phone]

## 2013-01-08 LAB — CBC
HCT: 27.4 % — ABNORMAL LOW (ref 36.0–46.0)
Hemoglobin: 9.2 g/dL — ABNORMAL LOW (ref 12.0–15.0)
MCH: 31.7 pg (ref 26.0–34.0)
MCV: 94.5 fL (ref 78.0–100.0)
RBC: 2.9 MIL/uL — ABNORMAL LOW (ref 3.87–5.11)

## 2013-01-08 LAB — GLUCOSE, CAPILLARY
Glucose-Capillary: 114 mg/dL — ABNORMAL HIGH (ref 70–99)
Glucose-Capillary: 109 mg/dL — ABNORMAL HIGH (ref 70–99)
Glucose-Capillary: 98 mg/dL (ref 70–99)

## 2013-01-08 LAB — RENAL FUNCTION PANEL
Calcium: 9.1 mg/dL (ref 8.4–10.5)
Chloride: 105 mEq/L (ref 96–112)
GFR calc Af Amer: 12 mL/min — ABNORMAL LOW (ref >90)
Glucose, Bld: 111 mg/dL — ABNORMAL HIGH (ref 70–99)
Phosphorus: 4 mg/dL (ref 2.3–4.6)
Potassium: 3.9 mEq/L (ref 3.5–5.1)
Sodium: 141 mEq/L (ref 135–145)

## 2013-01-08 MED ORDER — METOPROLOL SUCCINATE ER 50 MG PO TB24
50.0000 mg | ORAL_TABLET | Freq: Every day | ORAL | Status: DC
  Filled 2013-01-08 (×2): qty 1

## 2013-01-08 MED ORDER — ENOXAPARIN SODIUM 100 MG/ML ~~LOC~~ SOLN
90.0000 mg | SUBCUTANEOUS | Status: AC
  Administered 2013-01-09 – 2013-01-14 (×6): 90 mg via SUBCUTANEOUS
  Filled 2013-01-08 (×6): qty 1

## 2013-01-08 NOTE — Progress Notes (Signed)
ANTICOAGULATION CONSULT NOTE - Initial Consult  Pharmacy Consult for Lovenox Indication: atrial fibrillation  Allergies  Allergen Reactions  . Metformin And Related     Renal insuff  . Omnipaque [Iohexol] Nausea And Vomiting    unknown    Patient Measurements: Height: 5\' 7"  (170.2 cm) Weight: 196 lb 6.9 oz (89.1 kg) (scale A) IBW/kg (Calculated) : 61.6 Heparin Dosing Weight:   Vital Signs: Temp: 98.3 F (36.8 C) (11/02 1300) Temp src: Oral (11/02 1300) BP: 129/57 mmHg (11/02 1300) Pulse Rate: 86 (11/02 1300)  Labs:  Recent Labs  01/06/13 2231 01/07/13 0405 01/08/13 0555  HGB 9.5* 8.9* 9.2*  HCT 29.1* 26.6* 27.4*  PLT 153 139* 154  CREATININE 4.09* 4.16* 3.77*    Estimated Creatinine Clearance: 13.9 ml/min (by C-G formula based on Cr of 3.77).   Medical History: Past Medical History  Diagnosis Date  . Diabetes mellitus   . Hypertension   . Leg pain   . Peripheral vascular disease   . Hyperlipidemia   . Hemorrhoids   . Ulcer 2009    gatric with history of bleeding - resolved  . COPD (chronic obstructive pulmonary disease)   . Cancer 1994    Left Breast  . Carotid artery occlusion     mild bilateral  . Anxiety   . Central retinal vein occlusion, right eye   . Anemia   . Chronic kidney disease     CKD stage III  . Atrial flutter 2008    remote  . Thyroid disease     toxic multinodular goiter with history of thyrotoxicosis now s/p RAI ablative therapy - now hypothyroid  . Depression   . Peripheral neuropathy   . GERD (gastroesophageal reflux disease)   . Obesity   . Hiatal hernia   . Schatzki's ring   . Cholelithiases   . Anorexia nervosa   . Chronic diastolic CHF (congestive heart failure)   . Aortic stenosis, mild     echo 12/2012  . Pulmonary HTN     mild PASP 53mmHg by echo 12/2012    Medications:  Scheduled:  . [START ON 01/09/2013] enoxaparin (LOVENOX) injection  90 mg Subcutaneous Q24H  . ferrous sulfate  325 mg Oral TID WC  .  insulin aspart  0-5 Units Subcutaneous QHS  . insulin aspart  0-9 Units Subcutaneous TID WC  . levothyroxine  50 mcg Oral QAC breakfast  . metoprolol succinate  50 mg Oral Daily  . mirtazapine  15 mg Oral Daily  . omega-3 acid ethyl esters  1 g Oral BID    Assessment: 79yo female on Apixaban for AFib, with acute renal failure and CrCl < 15.  To change to Lovenox until renal fxn recovers; last Apixaban dose at 1100 today.  Will give 1st dose tomorrow due to renal function.  Goal of Therapy:  Anti-Xa level 0.6-1.2 units/ml 4hrs after LMWH dose given Monitor platelets by anticoagulation protocol: Yes   Plan:  1.  D/C Apixaban 2.  Lovenox 90mg  SQ q24, 1st dose at 1200 tomorrow  Gracy Bruins, PharmD Clinical Pharmacist [facility]

## 2013-01-08 NOTE — Evaluation (Signed)
Physical Therapy Evaluation Patient Details Name: SUMYA MOHABIR MRN: [id number] DOB: 1932/02/28 Today's Date: 01/08/2013 Time: 0840-0903 PT Time Calculation (min): 23 min  PT Assessment / Plan / Recommendation History of Present Illness  Mckay L Badal is a 79 y.o. female who presents to the ED after lab results this week revealed rapid worsening of her renal function  She had been seen in her PCP's office 3 days ago and had labs done which revealed an increase in her BUN and Cr to 35/3.5 and she was told to stop her Lotensin Rx, and her Lasix Rx.    She had a repeat check on Friday and the numbers were further elevated at  41/4.2  and she was referred to the ED for evalauation.   Clinical Impression  Pt admitted with the above. Pt currently with functional limitations due to the deficits listed below (see PT Problem List). Pt moving well and near baseline of function.  However pt may benefit from HHPT safety evaluation due to recent falls.  Pt will benefit from skilled PT to increase their independence and safety with mobility to allow discharge to the venue listed below.       PT Assessment  Patient needs continued PT services    Follow Up Recommendations  Home health PT (safety evaluation)    Equipment Recommendations  None recommended by PT    Frequency Min 3X/week    Precautions / Restrictions Precautions Precautions: Fall (reports fall ~ 1 month ago with noticeable heeling abrasion) Restrictions Weight Bearing Restrictions: No   Pertinent Vitals/Pain No c/o pain; VSS      Mobility  Bed Mobility Bed Mobility: Supine to Sit;Sit to Supine Supine to Sit: 5: Supervision;HOB elevated Sit to Supine: 5: Supervision Transfers Transfers: Sit to Stand;Stand to Sit Sit to Stand: 5: Supervision;From bed Stand to Sit: 5: Supervision;To bed Ambulation/Gait Ambulation/Gait Assistance: 4: Min guard;5: Supervision Ambulation Distance (Feet): 80 Feet Assistive device: Rolling  walker Ambulation/Gait Assistance Details: Minguard for safety with max cues for body position within RW.  Pt tends to ambulate too far from RW and needs max cues to manage RW with turns.  Gait Pattern: Step-through pattern;Shuffle;Trunk flexed Gait velocity: decreased Wheelchair Mobility Wheelchair Mobility: No    Exercises     PT Diagnosis: Generalized weakness  PT Problem List: Decreased strength;Decreased activity tolerance;Decreased balance;Decreased mobility;Decreased knowledge of use of DME PT Treatment Interventions: DME instruction;Functional mobility training;Therapeutic activities;Therapeutic exercise;Balance training;Patient/family education;Stair training     PT Goals(Current goals can be found in the care plan section) Acute Rehab PT Goals Patient Stated Goal: To return home soon PT Goal Formulation: With patient Time For Goal Achievement: 01/15/13 Potential to Achieve Goals: Good  Visit Information  Last PT Received On: 01/08/13 Assistance Needed: +1 History of Present Illness: Tekelia L Raczkowski is a 79 y.o. female who presents to the ED after lab results this week revealed rapid worsening of her renal function  She had been seen in her PCP's office 3 days ago and had labs done which revealed an increase in her BUN and Cr to 35/3.5 and she was told to stop her Lotensin Rx, and her Lasix Rx.    She had a repeat check on Friday and the numbers were further elevated at  41/4.2  and she was referred to the ED for evalauation.        Prior [place] expects to be discharged to:: Private residence Living Arrangements: Children Available Help at Discharge: Family (  son works during the day) Type of Home: House Home Access: Stairs to enter Technical brewer of Steps: 3 Entrance Stairs-Rails: None Home Layout: One level Home Equipment: Shower seat;Cane - single point;Walker - 2 wheels Prior Function Level of Independence: Independent with  assistive device(s) Comments: Occasional uses walker  Communication Communication: No difficulties Dominant Hand: Right    Cognition  Cognition Arousal/Alertness: Awake/alert Behavior During Therapy: WFL for tasks assessed/performed Overall Cognitive Status: Within Functional Limits for tasks assessed    Extremity/Trunk Assessment Lower Extremity Assessment Lower Extremity Assessment: Generalized weakness   Balance    End of Session PT - End of Session Equipment Utilized During Treatment: Gait belt Activity Tolerance: Patient tolerated treatment well Patient left: in bed;with call bell/phone within reach Nurse Communication: Mobility status  GP     Adrina Armijo 01/08/2013, 9:20 AM  Antoine Poche, [place] DPT [phone]

## 2013-01-08 NOTE — Progress Notes (Signed)
Jackie Alvarez [id number] DOB: 27-Dec-1931 DOA: 01/06/2013 PCP: Maurice Small, Keturah Barre, MD  Brief narrative: 79 year old female, known history atrial fibrillation, chronic diastolic CHF ef 123456 [grade 2 dd] +mild AoS Echo 12/2012,  PVD, hypertension admitted from physician office w/ Acute Renal failure [basleine creat 1.6--->4.0], Nomocytic anemia  Past medical history-As per Problem list Chart reviewed as below- 10/21 -FEV1 percent of 63 10/22-c Afib Chad2vasc=5, recnetly started Eliquis 2.5 bid, Toprol Xl 12.5 bid Sees Hem for Anemia and TCP [work up for Multiple myeloma; haptoglobin, DAT, von Willebrand panel, PT, aPTT]? Admission 05/10/06 for AUGIB Admission 08/07/05 for Free Air Admission 05/18/02 for Multiple #'s Admission 11/17/99 for Ischemic L foot  Consultants:  none  Procedures:  none  Antibiotics:  none   Subjective  Doing fair. Denies n/v/cp Nursing reports non-sust Afib 140 rate when ambulant    Objective    Interim History:  none  Telemetry:  = atrial fibrillation Objective: Filed Vitals:   01/07/13 2042 01/08/13 0203 01/08/13 0519 01/08/13 1122  BP: 111/69 114/69 128/72 109/54  Pulse: 90 90 83 86  Temp: 97.3 F (36.3 C) 97.8 F (36.6 C) 98.2 F (36.8 C)   TempSrc: Oral Oral Oral   Resp: 18 18 17    Height:      Weight:   89.1 kg (196 lb 6.9 oz)   SpO2: 94% 94% 93%     Intake/Output Summary (Last 24 hours) at 01/08/13 1539 Last data filed at 01/08/13 0650  Gross per 24 hour  Intake   1590 ml  Output    501 ml  Net   1089 ml    Exam:  General:  EOMI, NCAT poor dentition Cardiovascular:  S1-S2 irregularly irregular rate controlled Respiratory:  clinically clear Abdomen:  soft nontender nondistended SkinRight lower strength he seems larger than left, patient states she's had 10 surgeries in both legs over the years  NeuroRange of motion intact, neurologically intact, moving all 4 limbs equally  Data Reviewed: Basic Metabolic Panel:  Recent  Labs Lab 01/04/13 1019 01/06/13 1024 01/06/13 2231 01/07/13 0405 01/08/13 0555  NA 142 140 144 141 141  K 4.1 4.1 4.3 3.9 3.9  CL 102 101 103 102 105  CO2 26 26 26 26 24   GLUCOSE 120* 123* 128* 104* 111*  BUN 35* 41* 45* 45* 39*  CREATININE 3.5* 4.2* 4.09* 4.16* 3.77*  CALCIUM 9.8 10.1 10.3 10.0 9.1  PHOS  --   --   --   --  4.0   Liver Function Tests:  Recent Labs Lab 01/06/13 2231 01/08/13 0555  AST 21  --   ALT 14  --   ALKPHOS 51  --   BILITOT 0.3  --   PROT 6.5  --   ALBUMIN 3.5 3.0*   No results found for this basename: LIPASE, AMYLASE,  in the last 168 hours No results found for this basename: AMMONIA,  in the last 168 hours CBC:  Recent Labs Lab 01/06/13 2231 01/07/13 0405 01/08/13 0555  WBC 4.2 4.0 4.2  NEUTROABS 2.8  --   --   HGB 9.5* 8.9* 9.2*  HCT 29.1* 26.6* 27.4*  MCV 95.4 95.0 94.5  PLT 153 139* 154   Cardiac Enzymes: No results found for this basename: CKTOTAL, CKMB, CKMBINDEX, TROPONINI,  in the last 168 hours BNP: No components found with this basename: POCBNP,  CBG:  Recent Labs Lab 01/07/13 1105 01/07/13 1701 01/07/13 2141 01/08/13 0604 01/08/13 1140  GLUCAP 135* 116* 127* 116*  114*    Recent Results (from the past 240 hour(s))  URINE CULTURE     Status: None   Collection Time    01/07/13  6:26 AM      Result Value Range Status   Specimen Description URINE, CLEAN CATCH   Final   Special Requests NONE   Final   Culture  Setup Time     Final   Value: 01/07/2013 19:19     Performed at [facility]     Final   Value: >=100,000 COLONIES/ML     Performed at [facility]   Culture     Final   Value: [place]     Performed at [facility]   Report Status PENDING   Incomplete     Studies:              All Imaging reviewed and is as per above notation   Scheduled Meds: . apixaban  2.5 mg Oral BID  . ferrous sulfate  325 mg Oral TID WC  . insulin aspart  0-5 Units  Subcutaneous QHS  . insulin aspart  0-9 Units Subcutaneous TID WC  . levothyroxine  50 mcg Oral QAC breakfast  . metoprolol succinate  50 mg Oral Daily  . mirtazapine  15 mg Oral Daily  . omega-3 acid ethyl esters  1 g Oral BID   Continuous Infusions: . sodium chloride 75 mL/hr at 01/08/13 [id number]     Assessment/Plan: 1.  acute renal failure-likely secondary to iatrogenic medications inclusive of Lotensin, Lasix-baseline creatinine 1.6. Continue cautious IV repletion at 75 cc per hour, avoid nephrotoxins [discontinued Zocor, discontinue pantoprazole, discontinue amlodipine for now]--creat trending downwards 2. Asymptomatic bacteriuria--no systemic indices of infection Rx not indic 3. Atrial fibrillation CHad2Vasc2 score= 5-continue Apixaban renally dosed.  Defer further management to cardiology as an outpatient.  Rate control with metoprolol succinate 25 daily-increased to 50 bid 01/08/13 for Fib in 140's 4. Borderline hypotension-blood pressure is 122/90-monitor closely-already have discontinued amlodipine 10 mg, discontinue doxazosin 5. Hypothyroidism-continue levothyroxine 50 mcg every morning 6. Diabetes mellitus-HbA1c 5.7 continue supplemental scale coverage 7. Osteoporosis-hold calcium vitamin D for now 8. Cryptogenic anemia-workup in process by oncology-continue ferrous sulfate 9. Hyperlipidemia-Hold omega-3 fatty acids for now 10.  Depression/sleep-continue mirtazapine 15 each bedtime  Code Status:  full Family Communication:  none at bedside Disposition Plan:  inpatient, 2-3 days   Verneita Griffes, MD  [facility] Pager [phone] 01/08/2013, 3:39 PM    LOS: 2 days

## 2013-01-08 NOTE — Progress Notes (Signed)
Utilization Review Completed.Jackie Alvarez T11/04/2012  

## 2013-01-08 NOTE — Progress Notes (Signed)
Patient stated she was not that hungry this morning and only took a few bites of breakfast this morning. Have been noticing that patient's heart rate goes up to the 140's non-sustaining whenever patient goes to the bathroom. Notified Dr.Samtani. Orders given. Will continue to monitor to end of shift.

## 2013-01-09 ENCOUNTER — Other Ambulatory Visit: Payer: Medicare Other

## 2013-01-09 LAB — URINE CULTURE: Colony Count: >=100000

## 2013-01-09 LAB — BASIC METABOLIC PANEL
CO2: 22 mEq/L (ref 19–32)
Chloride: 107 mEq/L (ref 96–112)
Creatinine, Ser: 3.11 mg/dL — ABNORMAL HIGH (ref 0.50–1.10)
GFR calc Af Amer: 15 mL/min — ABNORMAL LOW (ref >90)
GFR calc non Af Amer: 13 mL/min — ABNORMAL LOW (ref >90)
Sodium: 140 mEq/L (ref 135–145)

## 2013-01-09 LAB — GLUCOSE, CAPILLARY
Glucose-Capillary: 104 mg/dL — ABNORMAL HIGH (ref 70–99)
Glucose-Capillary: 105 mg/dL — ABNORMAL HIGH (ref 70–99)
Glucose-Capillary: 126 mg/dL — ABNORMAL HIGH (ref 70–99)

## 2013-01-09 MED ORDER — DIGOXIN 250 MCG PO TABS
0.2500 mg | ORAL_TABLET | Freq: Every day | ORAL | Status: DC
  Administered 2013-01-09: 0.25 mg via ORAL
  Filled 2013-01-09 (×2): qty 1

## 2013-01-09 MED ORDER — METOPROLOL SUCCINATE ER 50 MG PO TB24
50.0000 mg | ORAL_TABLET | Freq: Every day | ORAL | Status: DC
  Filled 2013-01-09: qty 1

## 2013-01-09 NOTE — Care Management Note (Addendum)
  Page 2 of 2   01/09/2013     1:51:56 PM   CARE MANAGEMENT NOTE 01/09/2013  Patient:  Jackie Alvarez, Jackie Alvarez   Account Number:  [id number]  Date Initiated:  01/09/2013  Documentation initiated by:  [facility]  Subjective/Objective Assessment:   79 y.o. female who presents to the ED after lab results this week revealed rapid worsening of her renal function  / Hm Alone (children nearby)     Action/Plan:   Gentle IVFs, and Discontinue Ace Inhibitor Rx and Furosemide Rx.   [place] Bun/Cr./ Home with [facility]   Anticipated DC Date:  01/11/2013   Anticipated DC Plan:  [place]  CM consult      [facility] Choice  HOME HEALTH   Choice offered to / List presented to:  C-1 Patient        [place] arranged  HH-1 RN  [place].   Status of service:  Completed, signed off Medicare Important Message given?   (If response is "NO", the following Medicare IM given date fields will be blank) Date Medicare IM given:   Date Additional Medicare IM given:    Discharge Disposition:    Per UR Regulation:  Reviewed for med. necessity/level of care/duration of stay  If discussed at [place] of Stay Meetings, dates discussed:    Comments:  01/09/2013 Fuller Mandril, RN, [place] [phone] Spoke with pt at bedside regarding discharge planning for [place]. Offered pt list of home health agencies to choose from.  Pt chose [facility] to render services of RN for disease management. Debbie of [facility] notified.  No DME needs identified at this time.

## 2013-01-09 NOTE — Progress Notes (Addendum)
Jackie Alvarez [id number] DOB: 12-02-1931 DOA: 01/06/2013 PCP: Maurice Small, Keturah Barre, MD  Brief narrative: 79 year old female, known history atrial fibrillation, chronic diastolic CHF ef 123456 [grade 2 dd] +mild AoS Echo 12/2012,  PVD, hypertension admitted from physician office w/ Acute Renal failure [basleine creat 1.6--->4.0], Nomocytic anemia  Past medical history-As per Problem list Chart reviewed as below- 10/21 -FEV1 percent of 63 10/22-c Afib Chad2vasc=5, recnetly started Eliquis 2.5 bid, Toprol Xl 12.5 bid Sees Hem for Anemia and TCP [work up for Multiple myeloma; haptoglobin, DAT, von Willebrand panel, PT, aPTT]? Admission 05/10/06 for AUGIB Admission 08/07/05 for Free Air Admission 05/18/02 for Multiple #'s Admission 11/17/99 for Ischemic L foot  Consultants:  none  Procedures:  none  Antibiotics:  none   Subjective  Doing better.  Joking in room Denies n/v/cp Nursing reports non-sust Afib 140 rate when ambulant No metoprolol given yesterday-was informed today    Objective    Interim History:  none  Telemetry:   atrial fibrillation Objective: Filed Vitals:   01/08/13 1300 01/08/13 2100 01/09/13 0457 01/09/13 0625  BP: 129/57 126/97  139/56  Pulse: 86 93  78  Temp: 98.3 F (36.8 C) 98.1 F (36.7 C)  97.6 F (36.4 C)  TempSrc: Oral Oral  Oral  Resp: 20 18  18   Height:      Weight:   90.5 kg (199 lb 8.3 oz)   SpO2: 91% 93%  93%    Intake/Output Summary (Last 24 hours) at 01/09/13 0835 Last data filed at 01/09/13 [id number]  Gross per 24 hour  Intake 2888.75 ml  Output      0 ml  Net 2888.75 ml    Exam:  General:  EOMI, NCAT poor dentition Cardiovascular:  S1-S2 irregularly irregular rate controlled Respiratory:  clinically clear Abdomen:  soft nontender nondistended SkinRight lower strength he seems larger than left, patient states she's had 10 surgeries in both legs over the years  NeuroRange of motion intact, neurologically intact, moving all 4  limbs equally  Data Reviewed: Basic Metabolic Panel:  Recent Labs Lab 01/06/13 1024 01/06/13 2231 01/07/13 0405 01/08/13 0555 01/09/13 0615  NA 140 144 141 141 140  K 4.1 4.3 3.9 3.9 3.7  CL 101 103 102 105 107  CO2 26 26 26 24 22   GLUCOSE 123* 128* 104* 111* 101*  BUN 41* 45* 45* 39* 34*  CREATININE 4.2* 4.09* 4.16* 3.77* 3.11*  CALCIUM 10.1 10.3 10.0 9.1 8.7  PHOS  --   --   --  4.0  --    Liver Function Tests:  Recent Labs Lab 01/06/13 2231 01/08/13 0555  AST 21  --   ALT 14  --   ALKPHOS 51  --   BILITOT 0.3  --   PROT 6.5  --   ALBUMIN 3.5 3.0*   No results found for this basename: LIPASE, AMYLASE,  in the last 168 hours No results found for this basename: AMMONIA,  in the last 168 hours CBC:  Recent Labs Lab 01/06/13 2231 01/07/13 0405 01/08/13 0555  WBC 4.2 4.0 4.2  NEUTROABS 2.8  --   --   HGB 9.5* 8.9* 9.2*  HCT 29.1* 26.6* 27.4*  MCV 95.4 95.0 94.5  PLT 153 139* 154   Cardiac Enzymes: No results found for this basename: CKTOTAL, CKMB, CKMBINDEX, TROPONINI,  in the last 168 hours BNP: No components found with this basename: POCBNP,  CBG:  Recent Labs Lab 01/08/13 0604 01/08/13 1140 01/08/13 1648 01/08/13 2142  01/09/13 0518  GLUCAP 116* 114* 109* 98 94    Recent Results (from the past 240 hour(s))  URINE CULTURE     Status: None   Collection Time    01/07/13  6:26 AM      Result Value Range Status   Specimen Description URINE, CLEAN CATCH   Final   Special Requests NONE   Final   Culture  Setup Time     Final   Value: 01/07/2013 19:19     Performed at [facility]     Final   Value: >=100,000 COLONIES/ML     Performed at [facility]   Culture     Final   Value: [place]     Performed at [facility]   Report Status PENDING   Incomplete     Studies:              All Imaging reviewed and is as per above notation   Scheduled Meds: . digoxin  0.25 mg Oral Daily  .  enoxaparin (LOVENOX) injection  90 mg Subcutaneous Q24H  . ferrous sulfate  325 mg Oral TID WC  . insulin aspart  0-5 Units Subcutaneous QHS  . insulin aspart  0-9 Units Subcutaneous TID WC  . levothyroxine  50 mcg Oral QAC breakfast  . metoprolol succinate  50 mg Oral Daily  . mirtazapine  15 mg Oral Daily  . omega-3 acid ethyl esters  1 g Oral BID   Continuous Infusions: . sodium chloride 75 mL/hr at 01/08/13 1857     Assessment/Plan: 1.  acute renal failure-likely secondary to iatrogenic medications inclusive of Lotensin, Lasix-baseline creatinine 1.6. Continue cautious IV repletion at 75 cc per hour, avoid nephrotoxins [discontinued Zocor, discontinue pantoprazole, discontinue amlodipine for now]--creat trending downwards 2. Asymptomatic bacteriuria--no systemic indices of infection Rx not indic 3. Atrial fibrillation CHad2Vasc2 score= 5-continue Apixaban renally dosed.  Defer further management to cardiology as an outpatient.  Rate control with metoprolol succinate 25 daily-increased to 50 bid 01/08/13 for Fib in 140's--unfortunately HTn couldn't afford increase in Beta blocker, ? loaded with Digoxin 0.25 and consulted [facility] to make recommendations-appreciate input in advance. 4. Borderline hypotension-blood pressure is 122/90-monitor closely-already have discontinued amlodipine 10 mg, discontinue doxazosin 5. Hypothyroidism-continue levothyroxine 50 mcg every morning 6. Diabetes mellitus-HbA1c 5.7 continue supplemental scale coverage 7. Osteoporosis-hold calcium vitamin D for now 8. Cryptogenic anemia-workup in process by oncology-continue ferrous sulfate 9. Hyperlipidemia-Hold omega-3 fatty acids for now 10.  Depression/sleep-continue mirtazapine 15 each bedtime  Code Status:  full Family Communication:  none at bedside Disposition Plan:  inpatient, 2-3 days   Verneita Griffes, MD  [facility] Pager [phone] 01/09/2013, 8:35 AM    LOS: 3 days

## 2013-01-09 NOTE — Consult Note (Signed)
Admit date: 01/06/2013 Referring Physician  Dr. Verlon Au  Primary Cardiologist  Dr. [name] Reason for Consultation  CHF, AFib  HPI: 79 y/o with AFib and diastolic dysfunction.  She was admitted with acute on chronic renal failure.  She has been on renally dosed apixiban of late. She feels that her breathing is doing well.  She denies palpitations.  Holding nephrotoxic meds at this point.         PMH:   Past Medical History  Diagnosis Date  . Diabetes mellitus   . Hypertension   . Leg pain   . Peripheral vascular disease   . Hyperlipidemia   . Hemorrhoids   . Ulcer 2009    gatric with history of bleeding - resolved  . COPD (chronic obstructive pulmonary disease)   . Cancer 1994    Left Breast  . Carotid artery occlusion     mild bilateral  . Anxiety   . Central retinal vein occlusion, right eye   . Anemia   . Chronic kidney disease     CKD stage III  . Atrial flutter 2008    remote  . Thyroid disease     toxic multinodular goiter with history of thyrotoxicosis now s/p RAI ablative therapy - now hypothyroid  . Depression   . Peripheral neuropathy   . GERD (gastroesophageal reflux disease)   . Obesity   . Hiatal hernia   . Schatzki's ring   . Cholelithiases   . Anorexia nervosa   . Chronic diastolic CHF (congestive heart failure)   . Aortic stenosis, mild     echo 12/2012  . Pulmonary HTN     mild PASP 36mmHg by echo 12/2012     PSH:   Past Surgical History  Procedure Laterality Date  . Wrist surgery  02/2009  . Cholecystectomy      Gall Bladder  . Pr vein bypass graft,aorto-fem-pop    . Abdominal hysterectomy      with BSO  . Carotid endarterectomy    . Breast lumpectomy    . Abdominal aortagram  Jan. 18, 2013  . Hip surgery Left     Allergies:  Metformin and related and Omnipaque Prior to Admit Meds:   Prescriptions prior to admission  Medication Sig Dispense Refill  . amLODipine (NORVASC) 10 MG tablet Take 10 mg by mouth daily.        Marland Kitchen apixaban  (ELIQUIS) 2.5 MG TABS tablet Take 1 tablet (2.5 mg total) by mouth 2 (two) times daily.  60 tablet  11  . calcium-vitamin D (OSCAL 500/200 D-3) 500-200 MG-UNIT per tablet Take 1 tablet by mouth 2 (two) times daily.        Marland Kitchen doxazosin (CARDURA) 2 MG tablet Take 6 mg by mouth at bedtime.      . Ferrous Sulfate (IRON) 325 (65 FE) MG TABS Take 325 mg by mouth 3 (three) times daily.       . fish oil-omega-3 fatty acids 1000 MG capsule Take 1 g by mouth 2 (two) times daily.        Marland Kitchen levothyroxine (SYNTHROID, LEVOTHROID) 50 MCG tablet Take 50 mcg by mouth daily before breakfast.      . metoprolol succinate (TOPROL-XL) 25 MG 24 hr tablet 1/2 tablet daily  15 tablet  11  . mirtazapine (REMERON) 15 MG tablet Take 15 mg by mouth every morning.       Marland Kitchen omeprazole (PRILOSEC) 20 MG capsule Take 20 mg by mouth daily.        Marland Kitchen  pioglitazone (ACTOS) 30 MG tablet Take 30 mg by mouth daily.      . simvastatin (ZOCOR) 20 MG tablet Take 20 mg by mouth at bedtime.        . sitaGLIPtin (JANUVIA) 100 MG tablet Take 50 mg by mouth daily.        Marland Kitchen Umeclidinium-Vilanterol (ANORO ELLIPTA) 62.5-25 MCG/INH AEPB Inhale 25 mcg into the lungs daily as needed (shortness of breath).       Fam HX:    Family History  Problem Relation Age of Onset  . Cancer Mother     stomach  . Cancer Father     liver  . Cirrhosis Father   . Cancer Sister    Social HX:    History   Social History  . Marital Status: Married    Spouse Name: N/A    Number of Children: N/A  . Years of Education: N/A   Occupational History  . retired    Social History Main Topics  . Smoking status: Former Smoker -- 1.00 packs/day for 45 years    Types: Cigarettes    Quit date: 01/26/1997  . Smokeless tobacco: Never Used  . Alcohol Use: No  . Drug Use: No  . Sexual Activity: No   Other Topics Concern  . Not on file   Social History Narrative  . No narrative on file     ROS:  All 11 ROS were addressed and are negative except what is stated in  the HPI  Physical Exam: Blood pressure 137/74, pulse 70, temperature 97.6 F (36.4 C), temperature source Oral, resp. rate 18, height 5\' 7"  (1.702 m), weight 199 lb 8.3 oz (90.5 kg), SpO2 94.00%.    General: Well developed, well nourished, in no acute distress Head:    Normal cephalic and atramatic  Lungs: Clear bilaterally to auscultation and percussion. Heart:  Irregularly irregualr, No JVD.  No abdominal bruits. No femoral bruits. Abdomen: Bowel sounds are positive, abdomen soft and non-tender without masses or                  Hernia's noted. Msk:  Back normal, normal gait. Normal strength and tone for age. Extremities:  1+ edema bilaterally; dry skin on left lateral foot.   Neuro: Alert and oriented X 3. Psych:  Flat affect, responds appropriately    Labs:   Lab Results  Component Value Date   WBC 4.2 01/08/2013   HGB 9.2* 01/08/2013   HCT 27.4* 01/08/2013   MCV 94.5 01/08/2013   PLT 154 01/08/2013    Recent Labs Lab 01/06/13 2231  01/09/13 0615  NA 144  < > 140  K 4.3  < > 3.7  CL 103  < > 107  CO2 26  < > 22  BUN 45*  < > 34*  CREATININE 4.09*  < > 3.11*  CALCIUM 10.3  < > 8.7  PROT 6.5  --   --   BILITOT 0.3  --   --   ALKPHOS 51  --   --   ALT 14  --   --   AST 21  --   --   GLUCOSE 128*  < > 101*  < > = values in this interval not displayed. No results found for this basename: PTT   Lab Results  Component Value Date   INR 1.15 10/31/2012   INR 1.16 02/11/2009   Lab Results  Component Value Date   CKTOTAL 112 HEMOLYZED SPECIMEN, RESULTS MAY  BE AFFECTED 02/12/2009   CKMB 0.6 02/12/2009   TROPONINI  Value: 0.02        NO INDICATION OF MYOCARDIAL INJURY. 02/12/2009     No results found for this basename: CHOL   No results found for this basename: HDL   No results found for this basename: LDLCALC   No results found for this basename: TRIG   No results found for this basename: CHOLHDL   No results found for this basename: LDLDIRECT      Radiology:   No results found.  EKG:  AFib, NSST  ASSESSMENT: Diastolic dysfunction; acute on chronic renal failure  PLAN:  Agree with holding ACE-I.  Hold diuretics for now as she appears euvolemic.  Could try increasing metoprolol for better rate control if needed. HR may decrease while holding diuretics.  Renal fxn improving.  Jettie Booze., MD  01/09/2013  4:05 PM

## 2013-01-09 NOTE — Progress Notes (Signed)
Notified attending MD of patient's heart rate spiking up to 160's/170's while lying in bed but does not sustain. Pt's heart rate usually only increases when she gets up to go to the bathroom. Patient is asymptomatic. Will follow-up with new orders given and continue to monitor patient to end of shift.

## 2013-01-09 NOTE — Telephone Encounter (Signed)
Noted  

## 2013-01-10 ENCOUNTER — Inpatient Hospital Stay (HOSPITAL_COMMUNITY): Payer: Medicare Other

## 2013-01-10 DIAGNOSIS — I482 Chronic atrial fibrillation, unspecified: Secondary | ICD-10-CM | POA: Diagnosis present

## 2013-01-10 DIAGNOSIS — I5033 Acute on chronic diastolic (congestive) heart failure: Secondary | ICD-10-CM

## 2013-01-10 DIAGNOSIS — J441 Chronic obstructive pulmonary disease with (acute) exacerbation: Secondary | ICD-10-CM

## 2013-01-10 DIAGNOSIS — J9600 Acute respiratory failure, unspecified whether with hypoxia or hypercapnia: Secondary | ICD-10-CM

## 2013-01-10 DIAGNOSIS — J90 Pleural effusion, not elsewhere classified: Secondary | ICD-10-CM

## 2013-01-10 LAB — CBC
Hemoglobin: 9.3 g/dL — ABNORMAL LOW (ref 12.0–15.0)
MCH: 31.7 pg (ref 26.0–34.0)
MCHC: 32.7 g/dL (ref 30.0–36.0)
Platelets: 150 10*3/uL (ref 150–400)
RBC: 2.93 MIL/uL — ABNORMAL LOW (ref 3.87–5.11)
WBC: 5.6 10*3/uL (ref 4.0–10.5)

## 2013-01-10 LAB — PRO B NATRIURETIC PEPTIDE: Pro B Natriuretic peptide (BNP): 7146 pg/mL — ABNORMAL HIGH (ref 0–450)

## 2013-01-10 LAB — GLUCOSE, CAPILLARY
Glucose-Capillary: 99 mg/dL (ref 70–99)
Glucose-Capillary: 171 mg/dL — ABNORMAL HIGH (ref 70–99)

## 2013-01-10 MED ORDER — METHYLPREDNISOLONE SODIUM SUCC 125 MG IJ SOLR
125.0000 mg | Freq: Once | INTRAMUSCULAR | Status: AC
  Administered 2013-01-10: 125 mg via INTRAVENOUS
  Filled 2013-01-10 (×2): qty 2

## 2013-01-10 MED ORDER — PREDNISONE 10 MG PO TABS
60.0000 mg | ORAL_TABLET | Freq: Every day | ORAL | Status: DC
  Administered 2013-01-11: 06:00:00 60 mg via ORAL
  Filled 2013-01-10 (×2): qty 1

## 2013-01-10 MED ORDER — BUDESONIDE 0.5 MG/2ML IN SUSP
0.5000 mg | Freq: Two times a day (BID) | RESPIRATORY_TRACT | Status: DC
  Administered 2013-01-10 – 2013-01-20 (×21): 0.5 mg via RESPIRATORY_TRACT
  Filled 2013-01-10 (×24): qty 2

## 2013-01-10 MED ORDER — WARFARIN SODIUM 2.5 MG PO TABS
2.5000 mg | ORAL_TABLET | Freq: Once | ORAL | Status: AC
  Administered 2013-01-10: 2.5 mg via ORAL
  Filled 2013-01-10: qty 1

## 2013-01-10 MED ORDER — IPRATROPIUM BROMIDE 0.02 % IN SOLN
0.5000 mg | RESPIRATORY_TRACT | Status: DC
  Administered 2013-01-10 (×2): 0.5 mg via RESPIRATORY_TRACT
  Filled 2013-01-10 (×2): qty 2.5

## 2013-01-10 MED ORDER — IPRATROPIUM BROMIDE 0.02 % IN SOLN
0.5000 mg | Freq: Three times a day (TID) | RESPIRATORY_TRACT | Status: DC
  Administered 2013-01-10 – 2013-01-12 (×5): 0.5 mg via RESPIRATORY_TRACT
  Filled 2013-01-10 (×5): qty 2.5

## 2013-01-10 MED ORDER — LEVOFLOXACIN IN D5W 500 MG/100ML IV SOLN
500.0000 mg | INTRAVENOUS | Status: DC
  Administered 2013-01-12: 07:00:00 500 mg via INTRAVENOUS
  Filled 2013-01-10: qty 100

## 2013-01-10 MED ORDER — ALBUTEROL SULFATE (5 MG/ML) 0.5% IN NEBU
2.5000 mg | INHALATION_SOLUTION | Freq: Three times a day (TID) | RESPIRATORY_TRACT | Status: DC
  Administered 2013-01-10 – 2013-01-12 (×4): 2.5 mg via RESPIRATORY_TRACT
  Filled 2013-01-10 (×5): qty 0.5

## 2013-01-10 MED ORDER — WARFARIN VIDEO
1.0000 | Freq: Once | Status: AC
  Administered 2013-01-11: 12:00:00 1

## 2013-01-10 MED ORDER — COUMADIN BOOK
1.0000 | Freq: Once | Status: AC
  Administered 2013-01-10: 12:00:00 1
  Filled 2013-01-10: qty 1

## 2013-01-10 MED ORDER — ALBUTEROL SULFATE (5 MG/ML) 0.5% IN NEBU
2.5000 mg | INHALATION_SOLUTION | RESPIRATORY_TRACT | Status: DC
  Administered 2013-01-10 (×2): 2.5 mg via RESPIRATORY_TRACT
  Filled 2013-01-10 (×2): qty 0.5

## 2013-01-10 MED ORDER — METOPROLOL SUCCINATE ER 50 MG PO TB24
50.0000 mg | ORAL_TABLET | Freq: Every day | ORAL | Status: DC
  Administered 2013-01-10: 06:00:00 50 mg via ORAL
  Filled 2013-01-10 (×2): qty 1

## 2013-01-10 MED ORDER — FUROSEMIDE 10 MG/ML IJ SOLN
40.0000 mg | Freq: Once | INTRAMUSCULAR | Status: AC
  Administered 2013-01-10: 13:00:00 40 mg via INTRAVENOUS
  Filled 2013-01-10: qty 4

## 2013-01-10 MED ORDER — LEVOFLOXACIN IN D5W 750 MG/150ML IV SOLN
750.0000 mg | Freq: Once | INTRAVENOUS | Status: AC
  Administered 2013-01-10: 13:00:00 750 mg via INTRAVENOUS
  Filled 2013-01-10: qty 150

## 2013-01-10 MED ORDER — WARFARIN - PHARMACIST DOSING INPATIENT
Freq: Every day | Status: DC
  Administered 2013-01-10 – 2013-01-18 (×9)

## 2013-01-10 NOTE — Progress Notes (Addendum)
Jackie Alvarez [id number] DOB: 1931-07-20 DOA: 01/06/2013 PCP: Maurice Small, Keturah Barre, MD  Brief narrative: 79 year old female, known history atrial fibrillation, chronic diastolic CHF ef 123456 [grade 2 dd] +mild AoS Echo 12/2012,  PVD, hypertension admitted from physician office w/ Acute Renal failure [basleine creat 1.6--->4.0], Nomocytic anemia  Past medical history-As per Problem list Chart reviewed as below- 10/21 -FEV1 percent of 63 10/22-c Afib Chad2vasc=5, recnetly started Eliquis 2.5 bid, Toprol Xl 12.5 bid Sees Hem for Anemia and TCP [work up for Multiple myeloma; haptoglobin, DAT, von Willebrand panel, PT, aPTT]? Admission 05/10/06 for AUGIB Admission 08/07/05 for Free Air Admission 05/18/02 for Multiple #'s Admission 11/17/99 for Ischemic L foot  Consultants:  none  Procedures:  none  Antibiotics:  none     Objective   Acute SOB overnight with wheezing.  O2 saturations as low as 70% on RA.  Placed on 4L [place].  Still feeling SOB.  Started acutely when she got up to use the bathroom.  + LEE, + cough, + orthopnea.  Denies fevers or chills.    Telemetry:   atrial fibrillation, rate > 100 up tot he 150s Objective: Filed Vitals:   01/10/13 0144 01/10/13 0230 01/10/13 0603 01/10/13 0618  BP: 156/71  140/60   Pulse: 110  98 109  Temp: 97.5 F (36.4 C)   97.6 F (36.4 C)  TempSrc: Oral   Oral  Resp: 20   20  Height:      Weight:    91.672 kg (202 lb 1.6 oz)  SpO2: 98% 97%  99%    Intake/Output Summary (Last 24 hours) at 01/10/13 0733 Last data filed at 01/09/13 2200  Gross per 24 hour  Intake   1850 ml  Output      0 ml  Net   1850 ml    Exam:  General:  CF, mild Resp distress with tachypnea, forced exp phase, SCM retractinos HEENT: EOMI, NCAT poor dentition Cardiovascular:  S1-S2 irregularly irregular rate controlled Respiratory:  Diminished bilateral BS with full high pitched exp wheeze without focal rales or rhonchi Abdomen:  soft nontender  nondistended MSK:  2+ bilateral slow pitting edema lower extremities NeuroRange of motion intact, neurologically intact, moving all 4 limbs equally  Data Reviewed: Basic Metabolic Panel:  Recent Labs Lab 01/06/13 1024 01/06/13 2231 01/07/13 0405 01/08/13 0555 01/09/13 0615  NA 140 144 141 141 140  K 4.1 4.3 3.9 3.9 3.7  CL 101 103 102 105 107  CO2 26 26 26 24 22   GLUCOSE 123* 128* 104* 111* 101*  BUN 41* 45* 45* 39* 34*  CREATININE 4.2* 4.09* 4.16* 3.77* 3.11*  CALCIUM 10.1 10.3 10.0 9.1 8.7  PHOS  --   --   --  4.0  --    Liver Function Tests:  Recent Labs Lab 01/06/13 2231 01/08/13 0555  AST 21  --   ALT 14  --   ALKPHOS 51  --   BILITOT 0.3  --   PROT 6.5  --   ALBUMIN 3.5 3.0*   No results found for this basename: LIPASE, AMYLASE,  in the last 168 hours No results found for this basename: AMMONIA,  in the last 168 hours CBC:  Recent Labs Lab 01/06/13 2231 01/07/13 0405 01/08/13 0555 01/10/13 0605  WBC 4.2 4.0 4.2 5.6  NEUTROABS 2.8  --   --   --   HGB 9.5* 8.9* 9.2* 9.3*  HCT 29.1* 26.6* 27.4* 28.4*  MCV 95.4 95.0 94.5 96.9  PLT 153 139* 154 150   Cardiac Enzymes: No results found for this basename: CKTOTAL, CKMB, CKMBINDEX, TROPONINI,  in the last 168 hours BNP: No components found with this basename: POCBNP,  CBG:  Recent Labs Lab 01/09/13 0518 01/09/13 1133 01/09/13 1620 01/09/13 2118 01/10/13 0554  GLUCAP 94 104* 105* 126* 99    Recent Results (from the past 240 hour(s))  URINE CULTURE     Status: None   Collection Time    01/07/13  6:26 AM      Result Value Range Status   Specimen Description URINE, CLEAN CATCH   Final   Special Requests NONE   Final   Culture  Setup Time     Final   Value: 01/07/2013 19:19     Performed at [facility]     Final   Value: >=100,000 COLONIES/ML     Performed at [facility]   Culture     Final   Value: KLEBSIELLA PNEUMONIAE     Performed at [facility]    Report Status 01/09/2013 FINAL   Final   Organism ID, Bacteria KLEBSIELLA PNEUMONIAE   Final     Studies:              All Imaging reviewed and is as per above notation   Scheduled Meds: . ipratropium  0.5 mg Nebulization Q4H   And  . albuterol  2.5 mg Nebulization Q4H  . budesonide (PULMICORT) nebulizer solution  0.5 mg Nebulization BID  . digoxin  0.25 mg Oral Daily  . enoxaparin (LOVENOX) injection  90 mg Subcutaneous Q24H  . ferrous sulfate  325 mg Oral TID WC  . furosemide  40 mg Intravenous Once  . insulin aspart  0-5 Units Subcutaneous QHS  . insulin aspart  0-9 Units Subcutaneous TID WC  . levothyroxine  50 mcg Oral QAC breakfast  . metoprolol succinate  50 mg Oral Daily  . mirtazapine  15 mg Oral Daily  . omega-3 acid ethyl esters  1 g Oral BID   Continuous Infusions:     Assessment/Plan: 1. Acute renal failure, possible due to lotensin, Lasix, baseline creatinine 1.6  Acute hypoxic resp failure due to acute on chronic diastolic heart failure and possible COPD exac Acute on chronic diastolic heart failure, BNP elevated and pulm edema on CXR -  Daily weight -  STrict I/o -  Lasix 40mg  IV once -  D/c IVF -  ECHO completed 2 days ago -  Appreciate cardiology assistance Acute COPD exacerbation -  Solumedrol x 1, then pred tomorrow.  Fast taper if improving with diuresis -  duonebs -  Levofloxacin x 3 days (unless PNA on CXR) -  Budesonide BID 2. Asymptomatic bacteriuria--no systemic indices of infection Rx not indic 3. Atrial fibrillation CHad2Vasc2 score= 5 --continue Apixaban  - Rate control per cardiology -  Appreciate cardiology recommendations 4. Borderline hypotension, BP improving.   - continue to hold amlodipine and doxazosin 5. Hypothyroidism- stable.  continue levothyroxine 50 mcg every morning 6. Diabetes mellitus-HbA1c 5.7, FS well controlled.  Continue SSI 7. Osteoporosis-hold calcium vitamin D for now 8. Cryptogenic anemia-workup in process by  oncology-continue ferrous sulfate 9. Hyperlipidemia-Hold omega-3 fatty acids for now 10.  Depression/sleep-continue mirtazapine 15 each bedtime  Code Status:  full Family Communication:  none at bedside Disposition Plan:  Pending improvement in resp distress, HR   Janece Canterbury, MD  [facility] Pager [phone]  01/10/2013, 7:33 AM  LOS: 4 days

## 2013-01-10 NOTE — Progress Notes (Signed)
ANTIBIOTIC CONSULT NOTE - INITIAL  Pharmacy Consult for Levaquin  Indication: COPD Exacerbation  Allergies  Allergen Reactions  . Metformin And Related     Renal insuff  . Omnipaque [Iohexol] Nausea And Vomiting    unknown    Patient Measurements: Height: 5\' 7"  (170.2 cm) Weight: 202 lb 1.6 oz (91.672 kg) (a scale) IBW/kg (Calculated) : 61.6  Vital Signs: Temp: 97.6 F (36.4 C) (11/04 0618) Temp src: Oral (11/04 0618) BP: 140/60 mmHg (11/04 0603) Pulse Rate: 109 (11/04 0618) Intake/Output from previous day: 11/03 0701 - 11/04 0700 In: [place] [P.O.:720; I.V.:1130] Out: -   Labs:  Recent Labs  01/08/13 0555 01/09/13 0615 01/10/13 0605  WBC 4.2  --  5.6  HGB 9.2*  --  9.3*  PLT 154  --  150  CREATININE 3.77* 3.11*  --    Estimated Creatinine Clearance: 17 ml/min (by C-G formula based on Cr of 3.11). No results found for this basename: Letta Median, VANCORANDOM, [place], GENTPEAK, GENTRANDOM, TOBRATROUGH, TOBRAPEAK, TOBRARND, AMIKACINPEAK, AMIKACINTROU, AMIKACIN,  in the last 72 hours   Microbiology: Recent Results (from the past 720 hour(s))  URINE CULTURE     Status: None   Collection Time    01/07/13  6:26 AM      Result Value Range Status   Specimen Description URINE, CLEAN CATCH   Final   Special Requests NONE   Final   Culture  Setup Time     Final   Value: 01/07/2013 19:19     Performed at [facility]     Final   Value: >=100,000 COLONIES/ML     Performed at [facility]   Culture     Final   Value: KLEBSIELLA PNEUMONIAE     Performed at [facility]   Report Status 01/09/2013 FINAL   Final   Organism ID, Bacteria KLEBSIELLA PNEUMONIAE   Final    Medical History: Past Medical History  Diagnosis Date  . Diabetes mellitus   . Hypertension   . Leg pain   . Peripheral vascular disease   . Hyperlipidemia   . Hemorrhoids   . Ulcer 2009    gatric with history of bleeding - resolved  . COPD  (chronic obstructive pulmonary disease)   . Cancer 1994    Left Breast  . Carotid artery occlusion     mild bilateral  . Anxiety   . Central retinal vein occlusion, right eye   . Anemia   . Chronic kidney disease     CKD stage III  . Atrial flutter 2008    remote  . Thyroid disease     toxic multinodular goiter with history of thyrotoxicosis now s/p RAI ablative therapy - now hypothyroid  . Depression   . Peripheral neuropathy   . GERD (gastroesophageal reflux disease)   . Obesity   . Hiatal hernia   . Schatzki's ring   . Cholelithiases   . Anorexia nervosa   . Chronic diastolic CHF (congestive heart failure)   . Aortic stenosis, mild     echo 12/2012  . Pulmonary HTN     mild PASP 80mmHg by echo 12/2012    Assessment: 79 y/o F here with acute renal failure, to start Levaquin x 3 days for COPD exacerbation. WBC 5.6, afebrile, CrCl ~ 20.   Goal of Therapy:  Clinical resolution   Plan:  -Levaquin 750 mg IV x 1, then 500 mg IV q48h for a total of  3 days of therapy -Trend WBC, temp, renal function   Thank you for allowing me to take part in this patient's care,  Narda Bonds, PharmD Clinical Pharmacist Phone: [phone] Pager: [phone] 01/10/2013 7:49 AM

## 2013-01-10 NOTE — Consult Note (Signed)
PHARMACY CONSULT NOTE  Pharmacy Consult:  Coumadin with Lovenox bridging  Indication: atrial fibrillation  Allergies: Allergies  Allergen Reactions  . Metformin And Related     Renal insuff  . Omnipaque [Iohexol] Nausea And Vomiting    unknown    Height/Weight: Height: 5\' 7"  (170.2 cm) Weight: 202 lb 1.6 oz (91.672 kg) (a scale) IBW/kg (Calculated) : 61.6 Dosing weight 91.7 kg  Vital Signs: BP 140/60  Pulse 109  Temp(Src) 97.6 F (36.4 C) (Oral)  Resp 20  Ht 5\' 7"  (1.702 m)  Wt 202 lb 1.6 oz (91.672 kg)  BMI 31.65 kg/m2  SpO2 95%  Active Problems: Principal Problem:   Acute on chronic renal failure Active Problems:   Peripheral vascular disease, unspecified   COPD (chronic obstructive pulmonary disease) with emphysema   Thyroid disease   Anemia   HTN (hypertension)   Chronic diastolic heart failure   Acute respiratory failure with hypoxia   COPD with acute exacerbation   Acute on chronic diastolic heart failure   Chronic atrial fibrillation   Labs:  Recent Labs  01/08/13 0555 01/09/13 0615 01/10/13 0605 01/10/13 1028  HGB 9.2*  --  9.3*  --   HCT 27.4*  --  28.4*  --   PLT 154  --  150  --   LABPROT  --   --   --  15.5*  INR  --   --   --  1.26  CREATININE 3.77* 3.11*  --   --    Estimated Creatinine Clearance: 17 ml/min (by C-G formula based on Cr of 3.11).  Medical / Surgical History: Past Medical History  Diagnosis Date  . Diabetes mellitus   . Hypertension   . Leg pain   . Peripheral vascular disease   . Hyperlipidemia   . Hemorrhoids   . Ulcer 2009    gatric with history of bleeding - resolved  . COPD (chronic obstructive pulmonary disease)   . Cancer 1994    Left Breast  . Carotid artery occlusion     mild bilateral  . Anxiety   . Central retinal vein occlusion, right eye   . Anemia   . Chronic kidney disease     CKD stage III  . Atrial flutter 2008    remote  . Thyroid disease     toxic multinodular goiter with history of  thyrotoxicosis now s/p RAI ablative therapy - now hypothyroid  . Depression   . Peripheral neuropathy   . GERD (gastroesophageal reflux disease)   . Obesity   . Hiatal hernia   . Schatzki's ring   . Cholelithiases   . Anorexia nervosa   . Chronic diastolic CHF (congestive heart failure)   . Aortic stenosis, mild     echo 12/2012  . Pulmonary HTN     mild PASP 59mmHg by echo 12/2012   Past Surgical History  Procedure Laterality Date  . Wrist surgery  02/2009  . Cholecystectomy      Gall Bladder  . Pr vein bypass graft,aorto-fem-pop    . Abdominal hysterectomy      with BSO  . Carotid endarterectomy    . Breast lumpectomy    . Abdominal aortagram  Jan. 18, 2013  . Hip surgery Left     Medications:  Prescriptions prior to admission  Medication Sig Dispense Refill  . amLODipine (NORVASC) 10 MG tablet Take 10 mg by mouth daily.        Marland Kitchen apixaban (ELIQUIS) 2.5 MG TABS  tablet Take 1 tablet (2.5 mg total) by mouth 2 (two) times daily.  60 tablet  11  . calcium-vitamin D (OSCAL 500/200 D-3) 500-200 MG-UNIT per tablet Take 1 tablet by mouth 2 (two) times daily.        Marland Kitchen doxazosin (CARDURA) 2 MG tablet Take 6 mg by mouth at bedtime.      . Ferrous Sulfate (IRON) 325 (65 FE) MG TABS Take 325 mg by mouth 3 (three) times daily.       . fish oil-omega-3 fatty acids 1000 MG capsule Take 1 g by mouth 2 (two) times daily.        Marland Kitchen levothyroxine (SYNTHROID, LEVOTHROID) 50 MCG tablet Take 50 mcg by mouth daily before breakfast.      . metoprolol succinate (TOPROL-XL) 25 MG 24 hr tablet 1/2 tablet daily  15 tablet  11  . mirtazapine (REMERON) 15 MG tablet Take 15 mg by mouth every morning.       Marland Kitchen omeprazole (PRILOSEC) 20 MG capsule Take 20 mg by mouth daily.        . pioglitazone (ACTOS) 30 MG tablet Take 30 mg by mouth daily.      . simvastatin (ZOCOR) 20 MG tablet Take 20 mg by mouth at bedtime.        . sitaGLIPtin (JANUVIA) 100 MG tablet Take 50 mg by mouth daily.        Marland Kitchen  Umeclidinium-Vilanterol (ANORO ELLIPTA) 62.5-25 MCG/INH AEPB Inhale 25 mcg into the lungs daily as needed (shortness of breath).       Scheduled:  . ipratropium  0.5 mg Nebulization Q4H   And  . albuterol  2.5 mg Nebulization Q4H  . budesonide (PULMICORT) nebulizer solution  0.5 mg Nebulization BID  . enoxaparin (LOVENOX) injection  90 mg Subcutaneous Q24H  . ferrous sulfate  325 mg Oral TID WC  . furosemide  40 mg Intravenous Once  . insulin aspart  0-5 Units Subcutaneous QHS  . insulin aspart  0-9 Units Subcutaneous TID WC  . [START ON 01/12/2013] levofloxacin (LEVAQUIN) IV  500 mg Intravenous Q48H  . levofloxacin (LEVAQUIN) IV  750 mg Intravenous Once  . levothyroxine  50 mcg Oral QAC breakfast  . methylPREDNISolone (SOLU-MEDROL) injection  125 mg Intravenous Once  . metoprolol succinate  50 mg Oral Daily  . mirtazapine  15 mg Oral Daily  . omega-3 acid ethyl esters  1 g Oral BID  . [START ON 01/11/2013] predniSONE  60 mg Oral Q breakfast   Anti-infectives   Start     Dose/Rate Route Frequency Ordered Stop   01/12/13 0800  levofloxacin (LEVAQUIN) IVPB 500 mg     500 mg 100 mL/hr over 60 Minutes Intravenous Every 48 hours 01/10/13 0750 01/14/13 0759   01/10/13 0800  levofloxacin (LEVAQUIN) IVPB 750 mg     750 mg 100 mL/hr over 90 Minutes Intravenous  Once 01/10/13 0750        Assessment:  79 y.o.female will add Coumadin to Lovenox bridging for VTE prophylaxis.  Previous she was on Apixaban, but due to renal insufficiency, Coumadin selected instead of Apixaban.   Baseline INR 1.26.  Hgb 9.3.  Plts 150.  No bleeding complications noted   Patient started on Levaquin today, a potent potentiator of Coumadin's INR response.  Goal of Therapy:   INR 2-3  Anti-Xa level 0.6-1.2 units/ml 4hrs after LMWH dose given      Plan:   Coumadin 2.5 mg today.  Continue Lovenox bridging. Daily  INR's, CBC.  Monitor for bleeding complications.   Bookert Guzzi, Craig Guess,  Pharm.D.. 01/10/2013,   11:20 AM

## 2013-01-10 NOTE — Progress Notes (Signed)
SUBJECTIVE:  Complains of acute SOB this am  OBJECTIVE:   Vitals:   Filed Vitals:   01/10/13 0144 01/10/13 0230 01/10/13 0603 01/10/13 0618  BP: 156/71  140/60   Pulse: 110  98 109  Temp: 97.5 F (36.4 C)   97.6 F (36.4 C)  TempSrc: Oral   Oral  Resp: 20   20  Height:      Weight:    202 lb 1.6 oz (91.672 kg)  SpO2: 98% 97%  99%   I&O's:   Intake/Output Summary (Last 24 hours) at 01/10/13 [id number] Last data filed at 01/09/13 2200  Gross per 24 hour  Intake   1850 ml  Output      0 ml  Net   1850 ml   TELEMETRY: Reviewed telemetry pt in atrial fibrillation with CVR:     PHYSICAL EXAM General: Well developed, well nourished, in no acute distress Head: Eyes PERRLA, No xanthomas.   Normal cephalic and atramatic  Lungs:   Decreased BS throughout with diffuse expiratory wheezes Heart:   HRRR S1 S2 Pulses are 2+ & equal. Abdomen: Bowel sounds are positive, abdomen soft and non-tender without masses Extremities:   2+ edema  DP +1 Neuro: Alert and oriented X 3. Psych:  Good affect, responds appropriately   LABS: Basic Metabolic Panel:  Recent Labs  01/08/13 0555 01/09/13 0615  NA 141 140  K 3.9 3.7  CL 105 107  CO2 24 22  GLUCOSE 111* 101*  BUN 39* 34*  CREATININE 3.77* 3.11*  CALCIUM 9.1 8.7  PHOS 4.0  --    Liver Function Tests:  Recent Labs  01/08/13 0555  ALBUMIN 3.0*   No results found for this basename: LIPASE, AMYLASE,  in the last 72 hours CBC:  Recent Labs  01/08/13 0555 01/10/13 0605  WBC 4.2 5.6  HGB 9.2* 9.3*  HCT 27.4* 28.4*  MCV 94.5 96.9  PLT 154 150   Cardiac Enzymes: No results found for this basename: CKTOTAL, CKMB, CKMBINDEX, TROPONINI,  in the last 72 hours BNP: No components found with this basename: POCBNP,  D-Dimer: No results found for this basename: DDIMER,  in the last 72 hours Hemoglobin A1C: No results found for this basename: HGBA1C,  in the last 72 hours Fasting Lipid Panel: No results found for this basename:  CHOL, HDL, LDLCALC, TRIG, CHOLHDL, LDLDIRECT,  in the last 72 hours Thyroid Function Tests: No results found for this basename: TSH, T4TOTAL, FREET3, T3FREE, THYROIDAB,  in the last 72 hours Anemia Panel: No results found for this basename: VITAMINB12, FOLATE, FERRITIN, TIBC, IRON, RETICCTPCT,  in the last 72 hours Coag Panel:   Lab Results  Component Value Date   INR 1.15 10/31/2012   INR 1.16 02/11/2009    RADIOLOGY: Dg Chest Port 1 View  01/10/2013   CLINICAL DATA:  Shortness of breath  EXAM: PORTABLE CHEST - 1 VIEW  COMPARISON:  11/14/2012  FINDINGS: Cardiac shadow is stable. A moderate size right-sided pleural effusion is noted increased from the prior exam. Some mild interstitial changes are seen which may be related to mild vascular congestion.  IMPRESSION: Moderate right-sided pleural effusion new from the prior exam.  Mild vascular congestion.   Electronically Signed   By: Inez Catalina M.D.   On: 01/10/2013 07:35      ASSESSMENT/PLAN:  1.  Acute on chronic renal insufficiency  - baseline creatinine 1.6.  I suspect some of the worsening renal failure is due to ARB which has  been stopped and diuretics.  I suspect she also has some underlying intrinsic renal disease which has been worsening.  She has had issues with mild CHF as an outpt requiring diuretics but we have had to limit lasix due to renal insufficiency.    - At this time I recommend that we get renal medicine involved.  She needs diuresis for CHF. 2.  Acute on chronic diastolic CHF now worse after IVF given for renal failure.  She has an elevated BNP and LE edema as well as pulmonary edema on CXR  - Lasix 40mg  IV daily 3.  Acute COPD exacerbation - per Hospitalist 4.  Chronic atrial fibrillation borderline rate controlled at present  - would not use digoxin in the setting of acute renal failure  - continue metoprolol for rate control 5.  Chronic anticoagulation with Apixaban as outpt- that is now stopped and she is on  Lovenox full dose.  Her creatinine clearance today is 21.2 ml/min therefore would not recommend going back on Apixaban.  Will have pharmacy start to dose coumadin. 6.  HTN controlled at present    Sueanne Margarita, MD  01/10/2013  8:32 AM

## 2013-01-10 NOTE — Progress Notes (Signed)
Co-signed for [employer] RN/BSN for assessments, IV assessments, medication administration, progress notes, patient education, I's and O's, and vital signs. Tonny Branch, RN/BSN

## 2013-01-10 NOTE — Progress Notes (Signed)
Pt to bathroom with NT. Pt wheezing in room, O2 sat 70% on room air. Pt states "Can't talk". Pt HR per monitor states "tachy >170". Pt placed on 3Lnc, O2 sat in 80s, pt up to 98% on 4Lnc. Pt stopped wheezing. RN called monitor tech, monitor tech states pt HR up to 175 non-sustaining, pt HR now 110-120. BP 156/71, HR 110, O2 98% on 4Lnc. Pt lung sounds clear. Pt states she feels much better now, no complaints of shortness of breath more than usual, or chest pain. K. Schorr notified of events- States continue to monitor pt and notify MD on call/cardiology if sustains >130s or if pt is symptomatic. Shelby Mattocks, RN

## 2013-01-10 NOTE — Progress Notes (Signed)
K Schorr to see pt on floor. O2 Sat 97% on 2L, HR in Afib 110-130. New order to re-check BP and HR in AM and give 10AM dose of Toprol-XL at 6AM. Will notify MD if BP <120 prior to giving medicine. Pharmacy notified to change time of dose. Will continue to monitor Jackie Mattocks, RN

## 2013-01-10 NOTE — Progress Notes (Signed)
PT Cancellation Note  Patient Details Name: Jackie Alvarez MRN: [id number] DOB: Aug 04, 1931   Cancelled Treatment:    Reason Eval/Treat Not Completed:  (Refused secondary to breathing issues per pt.)   INGOLD,Devonta Blanford 01/10/2013, 4:09 PM [facility] [phone] (pager)

## 2013-01-10 NOTE — Progress Notes (Signed)
Event: Notified by RN that pt became very SOB w/ wheezing after being assisted to BR by nurse tech. Upon her arrival pt still SOB w/ 02 sats of 70% on r/a. 02 sats up to 98% when placed on 4L 02 via [place]. Wheezing subsided and SOB resolved. Continues to HR 98-110. Though RN notified that during this episode pt HR up to 175 nonsustained. RN reports pt has not received her Toprol XL for 2 days d/t parameter that states to hold for SBP < 120. RN also reports increased BLE pitting edema as compared to night of admission. NP to bedside. Subjective: Pt denies having CP or palpitations. Admits to episode of SOB which has now resolved.  Objective: Jackie Alvarez is a 79 year old female w/ a known h/o A-Fib, chronic diastolic CHF ef 123456 (grade 2 dd) +mild AoS Echo 12/2012, PVD and HTN who was admitted from her PCP office on 01/06/2013 w/ ARF (basleine creat 1.6--->4.0) and Nomocytic anemia. At bedside pt noted resting w/ eyes closed in NAD. When awakened she is alert and oriented x 2. BBS diminished at the bases but otherwise CTA. Most recent temp 97.5. BP-156/71, P-110, R-20 w/ 02 sats of 98% on 2L [place]. BLE pitting edema 2-3+ R>L.  Assessment/Plan: 1. Tachypneac episode w/ hypoxia: Currently resolved. Sats 98% on 2L [place]. Some signs of volume overload in setting of pt w/ known chronic diastolic CHF. Will add PCXR and BNP for this morning. 2. A-Fib w/ sustained ST: Rate persistently >100 and elevated as high as 140-170's w/ activity but this is first time pt has been symptomatic. Will give Toprol XL this am as pt has not been getting for 2 days d/t SBP < 120. Will continue to monitor closely on telemetry.  Jeryl Columbia, NP-C [facility] Pager [phone]

## 2013-01-11 DIAGNOSIS — I5033 Acute on chronic diastolic (congestive) heart failure: Secondary | ICD-10-CM

## 2013-01-11 LAB — BASIC METABOLIC PANEL
BUN: 33 mg/dL — ABNORMAL HIGH (ref 6–23)
CO2: 25 mEq/L (ref 19–32)
Calcium: 9 mg/dL (ref 8.4–10.5)
Chloride: 105 mEq/L (ref 96–112)
Creatinine, Ser: 2.67 mg/dL — ABNORMAL HIGH (ref 0.50–1.10)
Glucose, Bld: 242 mg/dL — ABNORMAL HIGH (ref 70–99)
Potassium: 5 mEq/L (ref 3.5–5.1)
Sodium: 142 mEq/L (ref 135–145)

## 2013-01-11 LAB — CBC
Hemoglobin: 10 g/dL — ABNORMAL LOW (ref 12.0–15.0)
MCHC: 33.2 g/dL (ref 30.0–36.0)
Platelets: 164 10*3/uL (ref 150–400)
RBC: 3.15 MIL/uL — ABNORMAL LOW (ref 3.87–5.11)
RDW: 14.3 % (ref 11.5–15.5)
WBC: 3.8 10*3/uL — ABNORMAL LOW (ref 4.0–10.5)

## 2013-01-11 LAB — IRON AND TIBC
Iron: 40 ug/dL — ABNORMAL LOW (ref 42–135)
TIBC: 295 ug/dL (ref 250–470)
UIBC: 255 ug/dL (ref 125–400)

## 2013-01-11 LAB — GLUCOSE, CAPILLARY: Glucose-Capillary: 311 mg/dL — ABNORMAL HIGH (ref 70–99)

## 2013-01-11 LAB — PROTIME-INR
INR: 1.32 (ref 0.00–1.49)
Prothrombin Time: 16.1 seconds — ABNORMAL HIGH (ref 11.6–15.2)

## 2013-01-11 MED ORDER — METOPROLOL TARTRATE 1 MG/ML IV SOLN
5.0000 mg | INTRAVENOUS | Status: AC | PRN
  Administered 2013-01-11 – 2013-01-12 (×2): 5 mg via INTRAVENOUS
  Filled 2013-01-11 (×2): qty 5

## 2013-01-11 MED ORDER — METOPROLOL SUCCINATE ER 50 MG PO TB24
75.0000 mg | ORAL_TABLET | Freq: Every day | ORAL | Status: DC
  Administered 2013-01-11 – 2013-01-12 (×2): 75 mg via ORAL
  Filled 2013-01-11 (×2): qty 1

## 2013-01-11 MED ORDER — PREDNISONE 50 MG PO TABS
50.0000 mg | ORAL_TABLET | Freq: Every day | ORAL | Status: DC
  Administered 2013-01-12: 06:00:00 50 mg via ORAL
  Filled 2013-01-11 (×2): qty 1

## 2013-01-11 MED ORDER — WARFARIN SODIUM 3 MG PO TABS
3.0000 mg | ORAL_TABLET | Freq: Once | ORAL | Status: AC
  Administered 2013-01-11: 18:00:00 3 mg via ORAL
  Filled 2013-01-11: qty 1

## 2013-01-11 MED ORDER — FUROSEMIDE 10 MG/ML IJ SOLN
40.0000 mg | Freq: Every day | INTRAMUSCULAR | Status: DC
  Administered 2013-01-11: 40 mg via INTRAVENOUS
  Filled 2013-01-11: qty 4

## 2013-01-11 MED ORDER — FUROSEMIDE 10 MG/ML IJ SOLN
80.0000 mg | Freq: Two times a day (BID) | INTRAMUSCULAR | Status: DC
  Administered 2013-01-11 – 2013-01-12 (×2): 80 mg via INTRAVENOUS
  Filled 2013-01-11 (×4): qty 8

## 2013-01-11 NOTE — Consult Note (Signed)
Jackie Alvarez is an 79 y.o. female referred by Dr Sheran Fava   Chief Complaint: Acute on CKD HPI: 79yo WF admitted 01/07/13 for abnormal Scr obtained on outpatient labs.  Has hx CKD 3 with Scr 1.7 in 9/14, 1.6 in 10/14 and then was 3.5 on 01/04/13 and increased to 4.2 on 01/06/13 (scr 1.3 in 12/10 and 1.4 1/13)  Pt was on benazepril and admits to poor PO intake over last 3 wks.  Her benazepril has been stopped and received IV fluids and Scr has trended down to 2.6. I/O's not done reliably til yest and UO was 1.8 liters.  No Hx gross hematuria, stones or use of NSAID's.  Has had DM and HTN for around 20 yrs.  SPEP neg in 8/14.  Past Medical History  Diagnosis Date  . Diabetes mellitus   . Hypertension   . Leg pain   . Peripheral vascular disease   . Hyperlipidemia   . Hemorrhoids   . Ulcer 2009    gatric with history of bleeding - resolved  . COPD (chronic obstructive pulmonary disease)   . Cancer 1994    Left Breast  . Carotid artery occlusion     mild bilateral  . Anxiety   . Central retinal vein occlusion, right eye   . Anemia   . Chronic kidney disease     CKD stage III  . Atrial flutter 2008    remote  . Thyroid disease     toxic multinodular goiter with history of thyrotoxicosis now s/p RAI ablative therapy - now hypothyroid  . Depression   . Peripheral neuropathy   . GERD (gastroesophageal reflux disease)   . Obesity   . Hiatal hernia   . Schatzki's ring   . Cholelithiases   . Anorexia nervosa   . Chronic diastolic CHF (congestive heart failure)   . Aortic stenosis, mild     echo 12/2012  . Pulmonary HTN     mild PASP 59mmHg by echo 12/2012    Past Surgical History  Procedure Laterality Date  . Wrist surgery  02/2009  . Cholecystectomy      Gall Bladder  . Pr vein bypass graft,aorto-fem-pop    . Abdominal hysterectomy      with BSO  . Carotid endarterectomy    . Breast lumpectomy    . Abdominal aortagram  Jan. 18, 2013  . Hip surgery Left     Family History   Problem Relation Age of Onset  . Cancer Mother     stomach  . Cancer Father     liver  . Cirrhosis Father   . Cancer Sister    No Fh of ESRD  Social History:  reports that she quit smoking about 15 years ago. Her smoking use included Cigarettes. She has a 45 pack-year smoking history. She has never used smokeless tobacco. She reports that she does not drink alcohol or use illicit drugs.  widowed  Allergies:  Allergies  Allergen Reactions  . Metformin And Related     Renal insuff  . Omnipaque [Iohexol] Nausea And Vomiting    unknown    Medications Prior to Admission  Medication Sig Dispense Refill  . amLODipine (NORVASC) 10 MG tablet Take 10 mg by mouth daily.        Marland Kitchen apixaban (ELIQUIS) 2.5 MG TABS tablet Take 1 tablet (2.5 mg total) by mouth 2 (two) times daily.  60 tablet  11  . calcium-vitamin D (OSCAL 500/200 D-3) 500-200 MG-UNIT per tablet  Take 1 tablet by mouth 2 (two) times daily.        Marland Kitchen doxazosin (CARDURA) 2 MG tablet Take 6 mg by mouth at bedtime.      . Ferrous Sulfate (IRON) 325 (65 FE) MG TABS Take 325 mg by mouth 3 (three) times daily.       . fish oil-omega-3 fatty acids 1000 MG capsule Take 1 g by mouth 2 (two) times daily.        Marland Kitchen levothyroxine (SYNTHROID, LEVOTHROID) 50 MCG tablet Take 50 mcg by mouth daily before breakfast.      . metoprolol succinate (TOPROL-XL) 25 MG 24 hr tablet 1/2 tablet daily  15 tablet  11  . mirtazapine (REMERON) 15 MG tablet Take 15 mg by mouth every morning.       Marland Kitchen omeprazole (PRILOSEC) 20 MG capsule Take 20 mg by mouth daily.        . pioglitazone (ACTOS) 30 MG tablet Take 30 mg by mouth daily.      . simvastatin (ZOCOR) 20 MG tablet Take 20 mg by mouth at bedtime.        . sitaGLIPtin (JANUVIA) 100 MG tablet Take 50 mg by mouth daily.        Marland Kitchen Umeclidinium-Vilanterol (ANORO ELLIPTA) 62.5-25 MCG/INH AEPB Inhale 25 mcg into the lungs daily as needed (shortness of breath).         Lab Results: UA: 30 prot, 21-50 wbc's with UC +  for klebsiella   Recent Labs  01/10/13 0605 01/11/13 0452  WBC 5.6 3.8*  HGB 9.3* 10.0*  HCT 28.4* 30.1*  PLT 150 164   BMET  Recent Labs  01/09/13 0615 01/11/13 0452  NA 140 142  K 3.7 5.0  CL 107 105  CO2 22 25  GLUCOSE 101* 242*  BUN 34* 33*  CREATININE 3.11* 2.67*  CALCIUM 8.7 9.0   LFT No results found for this basename: PROT, ALBUMIN, AST, ALT, ALKPHOS, BILITOT, BILIDIR, IBILI,  in the last 72 hours Dg Chest Port 1 View  01/10/2013   CLINICAL DATA:  Shortness of breath  EXAM: PORTABLE CHEST - 1 VIEW  COMPARISON:  11/14/2012  FINDINGS: Cardiac shadow is stable. A moderate size right-sided pleural effusion is noted increased from the prior exam. Some mild interstitial changes are seen which may be related to mild vascular congestion.  IMPRESSION: Moderate right-sided pleural effusion new from the prior exam.  Mild vascular congestion.   Electronically Signed   By: Inez Catalina M.D.   On: 01/10/2013 07:35    ROS: No change in vision + SOB but better.   No CP No ABD pain No arthritic CO No neuropathic CO No Dysuria  PHYSICAL EXAM: Blood pressure 136/68, pulse 94, temperature 96.4 F (35.8 C), temperature source Oral, resp. rate 19, height 5\' 7"  (1.702 m), weight 90.719 kg (200 lb), SpO2 95.00%. HEENT: PERRLA EOMI NECK:no JVD LUNGS:decreased BS Rt base CARDIAC:irreg,irreg wo MRG ABD:+ BS NTND no bruits EXT:1+ edema NEURO:CNI M&SI no asterixis  Assessment: 1. Acute on CKD3 probably sec to intravascular volume depletion on an ACE inhibitor.   Renal fx improving.   ? If could have renal vascular disease but no overt abd bruits 2. Klebsiella UTI 3. DM 4. HTN 5. Rt pleural effusion 6. Anemia, has had WU by Heme in past PLAN: 1. Leave off ACE 2. Check renal art dopplers 3. Check PTH 4. Check iron studies 5. Consider thoracentesis of rt pleural effusion 6. Increase lasix to BID  7. Daily Scr   Heidemarie Goodnow T 01/11/2013, 12:25 PM

## 2013-01-11 NOTE — Progress Notes (Signed)
Inpatient Diabetes Program Recommendations  AACE/ADA: New Consensus Statement on Inpatient Glycemic Control (2013)  Target Ranges:  Prepandial:   less than 140 mg/dL      Peak postprandial:   less than 180 mg/dL (1-2 hours)      Critically ill patients:  140 - 180 mg/dL     Results for Jackie Alvarez, Jackie Alvarez (MRN [id number]) as of 01/11/2013 12:52  Ref. Range 01/11/2013 05:45 01/11/2013 11:31  Glucose-Capillary Latest Range: 70-99 mg/dL 236 (H) 311 (H)    **Noted patient received 125 mg IV Solumedrol yesterday at 8am.  Patient now getting 60 mg Prednisone.  Having elevated CBGs.  **Patient eating 100% of meals   **MD- If patient continues to have elevated CBGs, please consider adding Novolog meal coverage- Novolog 4 units tid with meals   Will follow. Wyn Quaker RN, MSN, CDE Diabetes Coordinator Inpatient Diabetes Program Team Pager: [phone] (8a-10p)

## 2013-01-11 NOTE — Progress Notes (Signed)
Received pt resting, alert oriented with O2 at 2.5L per nasal canula, HR running 120-140's sustaining, asymptomatic, denies any chest pain, no nausea and vomiting, not in respiratory distress. NP on call was aware of the HR. Will continue to monitor pt

## 2013-01-11 NOTE — Progress Notes (Signed)
SATURATION QUALIFICATIONS: (This note is used to comply with regulatory documentation for home oxygen)  Patient Saturations on Room Air at Rest = 93%  Patient Saturations on Room Air while Ambulating = 79%  Patient Saturations on 2 Liters of oxygen while Ambulating = 91%  Please briefly explain why patient needs home oxygen:Pt needed O2 to keep sats above 90%.   Thanks. [facility] [phone] (pager)

## 2013-01-11 NOTE — Consult Note (Signed)
PHARMACY CONSULT NOTE  Pharmacy Consult:  Coumadin with Lovenox bridging  Indication: atrial fibrillation  Allergies: Allergies  Allergen Reactions  . Metformin And Related     Renal insuff  . Omnipaque [Iohexol] Nausea And Vomiting    unknown    Height/Weight: Height: 5\' 7"  (170.2 cm) Weight: 200 lb (90.719 kg) (scale a) IBW/kg (Calculated) : 61.6 Dosing weight 91.7 kg  Vital Signs: BP 133/65  Pulse 93  Temp(Src) 97.3 F (36.3 C) (Oral)  Resp 18  Ht 5\' 7"  (1.702 m)  Wt 200 lb (90.719 kg)  BMI 31.32 kg/m2  SpO2 95%  Active Problems: Principal Problem:   Acute on chronic renal failure Active Problems:   Peripheral vascular disease, unspecified   COPD (chronic obstructive pulmonary disease) with emphysema   Thyroid disease   Anemia   HTN (hypertension)   Chronic diastolic heart failure   Acute respiratory failure with hypoxia   COPD with acute exacerbation   Acute on chronic diastolic heart failure   Chronic atrial fibrillation   Labs:  Recent Labs  01/09/13 0615 01/10/13 0605 01/10/13 1028 01/11/13 0452  HGB  --  9.3*  --  10.0*  HCT  --  28.4*  --  30.1*  PLT  --  150  --  164  LABPROT  --   --  15.5* 16.1*  INR  --   --  1.26 1.32  CREATININE 3.11*  --   --  2.67*   Estimated Creatinine Clearance: 19.7 ml/min (by C-G formula based on Cr of 2.67).  Medical / Surgical History: Past Medical History  Diagnosis Date  . Diabetes mellitus   . Hypertension   . Leg pain   . Peripheral vascular disease   . Hyperlipidemia   . Hemorrhoids   . Ulcer 2009    gatric with history of bleeding - resolved  . COPD (chronic obstructive pulmonary disease)   . Cancer 1994    Left Breast  . Carotid artery occlusion     mild bilateral  . Anxiety   . Central retinal vein occlusion, right eye   . Anemia   . Chronic kidney disease     CKD stage III  . Atrial flutter 2008    remote  . Thyroid disease     toxic multinodular goiter with history of  thyrotoxicosis now s/p RAI ablative therapy - now hypothyroid  . Depression   . Peripheral neuropathy   . GERD (gastroesophageal reflux disease)   . Obesity   . Hiatal hernia   . Schatzki's ring   . Cholelithiases   . Anorexia nervosa   . Chronic diastolic CHF (congestive heart failure)   . Aortic stenosis, mild     echo 12/2012  . Pulmonary HTN     mild PASP 62mmHg by echo 12/2012   Past Surgical History  Procedure Laterality Date  . Wrist surgery  02/2009  . Cholecystectomy      Gall Bladder  . Pr vein bypass graft,aorto-fem-pop    . Abdominal hysterectomy      with BSO  . Carotid endarterectomy    . Breast lumpectomy    . Abdominal aortagram  Jan. 18, 2013  . Hip surgery Left     Medications:  Prescriptions prior to admission  Medication Sig Dispense Refill  . amLODipine (NORVASC) 10 MG tablet Take 10 mg by mouth daily.        Marland Kitchen apixaban (ELIQUIS) 2.5 MG TABS tablet Take 1 tablet (2.5 mg total) by  mouth 2 (two) times daily.  60 tablet  11  . calcium-vitamin D (OSCAL 500/200 D-3) 500-200 MG-UNIT per tablet Take 1 tablet by mouth 2 (two) times daily.        Marland Kitchen doxazosin (CARDURA) 2 MG tablet Take 6 mg by mouth at bedtime.      . Ferrous Sulfate (IRON) 325 (65 FE) MG TABS Take 325 mg by mouth 3 (three) times daily.       . fish oil-omega-3 fatty acids 1000 MG capsule Take 1 g by mouth 2 (two) times daily.        Marland Kitchen levothyroxine (SYNTHROID, LEVOTHROID) 50 MCG tablet Take 50 mcg by mouth daily before breakfast.      . metoprolol succinate (TOPROL-XL) 25 MG 24 hr tablet 1/2 tablet daily  15 tablet  11  . mirtazapine (REMERON) 15 MG tablet Take 15 mg by mouth every morning.       Marland Kitchen omeprazole (PRILOSEC) 20 MG capsule Take 20 mg by mouth daily.        . pioglitazone (ACTOS) 30 MG tablet Take 30 mg by mouth daily.      . simvastatin (ZOCOR) 20 MG tablet Take 20 mg by mouth at bedtime.        . sitaGLIPtin (JANUVIA) 100 MG tablet Take 50 mg by mouth daily.        Marland Kitchen  Umeclidinium-Vilanterol (ANORO ELLIPTA) 62.5-25 MCG/INH AEPB Inhale 25 mcg into the lungs daily as needed (shortness of breath).       Scheduled:  . ipratropium  0.5 mg Nebulization TID   And  . albuterol  2.5 mg Nebulization TID  . budesonide (PULMICORT) nebulizer solution  0.5 mg Nebulization BID  . enoxaparin (LOVENOX) injection  90 mg Subcutaneous Q24H  . ferrous sulfate  325 mg Oral TID WC  . furosemide  40 mg Intravenous Daily  . insulin aspart  0-5 Units Subcutaneous QHS  . insulin aspart  0-9 Units Subcutaneous TID WC  . [START ON 01/12/2013] levofloxacin (LEVAQUIN) IV  500 mg Intravenous Q48H  . levothyroxine  50 mcg Oral QAC breakfast  . metoprolol succinate  75 mg Oral Daily  . mirtazapine  15 mg Oral Daily  . omega-3 acid ethyl esters  1 g Oral BID  . predniSONE  60 mg Oral Q breakfast  . warfarin  1 each Does not apply Once  . Warfarin - Pharmacist Dosing Inpatient   Does not apply q1800   Anti-infectives   Start     Dose/Rate Route Frequency Ordered Stop   01/12/13 0800  levofloxacin (LEVAQUIN) IVPB 500 mg     500 mg 100 mL/hr over 60 Minutes Intravenous Every 48 hours 01/10/13 0750 01/14/13 0759   01/10/13 0800  levofloxacin (LEVAQUIN) IVPB 750 mg     750 mg 100 mL/hr over 90 Minutes Intravenous  Once 01/10/13 0750 01/10/13 1440      Assessment: 81 YOF Lovenox bridging to coumadin for afib. INR 1.32. Hgb 10.  Plts 164.  No bleeding complications noted. Pt. With  CKD-3 and ARF, scr slightly down today, est. crcl ~ 20 ml/min. Noted she is also on Levaquin D#2/3, a potent potentiator of Coumadin's INR response.   Goal of Therapy:   INR 2-3  Anti-Xa level 0.6-1.2 units/ml 4hrs after LMWH dose given      Plan:   Coumadin 3 mg today.  Continue bridging with lovenox 90mg  sq q 24 hrs Daily INR's, CBC.  Monitor for bleeding complications.   Guadelupe Sabin  Gloriann Loan, PharmD, [place]  Clinical Pharmacist  Pager: [phone]   01/11/2013,  8:33 AM

## 2013-01-11 NOTE — Progress Notes (Signed)
SUBJECTIVE:  Feels much better today  OBJECTIVE:   Vitals:   Filed Vitals:   01/10/13 1553 01/10/13 1943 01/10/13 2118 01/11/13 0519  BP:   134/54 133/65  Pulse:   105 93  Temp:   97.7 F (36.5 C) 97.3 F (36.3 C)  TempSrc:   Oral Oral  Resp:   18 18  Height:      Weight:    90.719 kg (200 lb)  SpO2: 95% 96% 96% 94%   I&O's:   Intake/Output Summary (Last 24 hours) at 01/11/13 [id number] Last data filed at 01/11/13 0500  Gross per 24 hour  Intake    720 ml  Output   1850 ml  Net  -1130 ml   TELEMETRY: Reviewed telemetry pt in atrial fibrillation     PHYSICAL EXAM General: Well developed, well nourished, in no acute distress Head: Eyes PERRLA, No xanthomas.   Normal cephalic and atramatic  Lungs:   Clear bilaterally to auscultation and percussion. Heart:   Irregularly irregular S1 S2 Pulses are 2+ & equal. Abdomen: Bowel sounds are positive, abdomen soft and non-tender without masses  Extremities:   2+ edema Neuro: Alert and oriented X 3. Psych:  Good affect, responds appropriately   LABS: Basic Metabolic Panel:  Recent Labs  01/09/13 0615 01/11/13 0452  NA 140 142  K 3.7 5.0  CL 107 105  CO2 22 25  GLUCOSE 101* 242*  BUN 34* 33*  CREATININE 3.11* 2.67*  CALCIUM 8.7 9.0   Liver Function Tests: No results found for this basename: AST, ALT, ALKPHOS, BILITOT, PROT, ALBUMIN,  in the last 72 hours No results found for this basename: LIPASE, AMYLASE,  in the last 72 hours CBC:  Recent Labs  01/10/13 0605 01/11/13 0452  WBC 5.6 3.8*  HGB 9.3* 10.0*  HCT 28.4* 30.1*  MCV 96.9 95.6  PLT 150 164   Coag Panel:   Lab Results  Component Value Date   INR 1.32 01/11/2013   INR 1.26 01/10/2013   INR 1.15 10/31/2012    RADIOLOGY: Dg Chest Port 1 View  01/10/2013   CLINICAL DATA:  Shortness of breath  EXAM: PORTABLE CHEST - 1 VIEW  COMPARISON:  11/14/2012  FINDINGS: Cardiac shadow is stable. A moderate size right-sided pleural effusion is noted increased from the  prior exam. Some mild interstitial changes are seen which may be related to mild vascular congestion.  IMPRESSION: Moderate right-sided pleural effusion new from the prior exam.  Mild vascular congestion.   Electronically Signed   By: Inez Catalina M.D.   On: 01/10/2013 07:35    ASSESSMENT/PLAN:  1. Acute on chronic renal insufficiency - baseline creatinine 1.6. Improved today. I suspect some of the worsening renal failure is due to ARB which has been stopped and diuretics. I suspect she also has some underlying intrinsic renal disease which has been worsening. She has had issues with mild CHF as an outpt requiring diuretics but we have had to limit lasix due to renal insufficiency.  - At this time I recommend considering getting renal medicine involved since her renal function has been tenuous with increasing need for diuretics for chronic CHF.   2. Acute on chronic diastolic CHF now worse after IVF given for renal failure. She has an elevated BNP and LE edema as well as pulmonary edema on CXR   - continue  Lasix 40mg  IV daily  3. Acute COPD exacerbation - per Hospitalist  4. Chronic atrial fibrillation borderline rate controlled at  present   - would not use digoxin in the setting of acute renal failure   - continue metoprolol for rate control and increase to 75mg  daily 5. Chronic anticoagulation with Apixaban as outpt- that is now stopped and she is on Lovenox full dose. Her creatinine clearance today is 21.2 ml/min therefore would not recommend going back on Apixaban. Now on coumadin.  6. HTN controlled at present        Jackie Margarita, MD  01/11/2013  7:18 AM

## 2013-01-11 NOTE — Progress Notes (Signed)
Pt with no complaints this AM, pt states she rested well tonight. Shelby Mattocks, RN

## 2013-01-11 NOTE — Progress Notes (Signed)
Physical Therapy Treatment Patient Details Name: Jackie Alvarez MRN: [id number] DOB: 16-Dec-1931 Today's Date: 01/11/2013 Time: 1355-1420 PT Time Calculation (min): 25 min  PT Assessment / Plan / Recommendation  History of Present Illness Jackie Alvarez is a 79 y.o. female who presents to the ED after lab results this week revealed rapid worsening of her renal function  She had been seen in her PCP's office 3 days ago and had labs done which revealed an increase in her BUN and Cr to 35/3.5 and she was told to stop her Lotensin Rx, and her Lasix Rx.    She had a repeat check on Friday and the numbers were further elevated at  41/4.2  and she was referred to the ED for evalauation.    PT Comments   Pt admitted with above. Pt currently with functional limitations due to continued endurance and safety deficits.  Pt will benefit from skilled PT to increase their independence and safety with mobility to allow discharge to the venue listed below.   Follow Up Recommendations  Home health PT (safety evaluation)                 Equipment Recommendations  None recommended by PT ; home O2 possibly       Frequency Min 3X/week   Progress towards PT Goals Progress towards PT goals: Progressing toward goals  Plan Current plan remains appropriate    Precautions / Restrictions Precautions Precautions: Fall (reports fall ~ 1 month ago with noticeable heeling abrasion) Restrictions Weight Bearing Restrictions: No   Pertinent Vitals/Pain O2 sats down to 79% on RA, 91 % O2 with 2LO2 with ambulation, no pain    Mobility  Bed Mobility Bed Mobility: Supine to Sit;Sit to Supine Supine to Sit: 5: Supervision;HOB elevated Sit to Supine: 5: Supervision Transfers Transfers: Sit to Stand;Stand to Sit Sit to Stand: 5: Supervision;From bed Stand to Sit: 5: Supervision;To bed Ambulation/Gait Ambulation/Gait Assistance: 4: Min guard;5: Supervision Ambulation Distance (Feet): 150 Feet Assistive device: Rolling  walker Ambulation/Gait Assistance Details: min guard assist for safety with cues for position in RW.  Needed assist with turns.  Had to ambulatie on O2 and DOE 3/4 Gait Pattern: Step-through pattern;Shuffle;Trunk flexed Gait velocity: decreased Stairs: No Wheelchair Mobility Wheelchair Mobility: No    Exercises General Exercises - Lower Extremity Ankle Circles/Pumps: AROM;10 reps;Both;Supine Heel Slides: AROM;Both;10 reps;Supine    PT Goals (current goals can now be found in the care plan section)    Visit Information  Last PT Received On: 01/11/13 Assistance Needed: +1 History of Present Illness: Jackie Alvarez is a 79 y.o. female who presents to the ED after lab results this week revealed rapid worsening of her renal function  She had been seen in her PCP's office 3 days ago and had labs done which revealed an increase in her BUN and Cr to 35/3.5 and she was told to stop her Lotensin Rx, and her Lasix Rx.    She had a repeat check on Friday and the numbers were further elevated at  41/4.2  and she was referred to the ED for evalauation.     Subjective Data  Subjective: "I just feel so tired."   Cognition  Cognition Arousal/Alertness: Awake/alert Behavior During Therapy: WFL for tasks assessed/performed Overall Cognitive Status: Within Functional Limits for tasks assessed    Balance  Balance Balance Assessed: Yes Static Standing Balance Static Standing - Balance Support: Bilateral upper extremity supported;During functional activity Static Standing - Level of Assistance:  5: Stand by assistance Static Standing - Comment/# of Minutes: 2  End of Session PT - End of Session Equipment Utilized During Treatment: Gait belt Activity Tolerance: Patient tolerated treatment well Patient left: in bed;with call bell/phone within reach Nurse Communication: Mobility status        INGOLD,Matthan Sledge 01/11/2013, 3:28 PM [facility] [phone]  (pager)

## 2013-01-11 NOTE — Progress Notes (Signed)
Jackie Alvarez [id number] DOB: [birth year] DOA: 01/06/2013 PCP: Maurice Small, D, MD   Assessment/Plan:  Acute renal failure, possible due to lotensin, Lasix, baseline creatinine 1.6, may have had some AKI secondary to dehydration.  Initial UA with hyaline casts.  -  Had some slight improvement with hydration -  Renally dose medications  -  Minimize nephrotoxins -  Renal duplex per nephrology -  Appreciate nephrology recommendation   Acute hypoxic resp failure due to acute on chronic diastolic heart failure and possible COPD exac Acute on chronic diastolic heart failure, BNP elevated and pulm edema on CXR -  Wt 90.7 >> -  -1.1 L yesterday -  Increase lasix to 80mg  IV BID -  ECHO completed 2 days ago  Moderate right pleural effusion, likely due to acute diastolic CHF exacerbation and hydration in the setting of AKI -  Diuresis -  Will need close outpatient follow up by Dr. Gwenette Alvarez, pulmonology, particularly if not improving  Acute COPD exacerbation -  Fast taper since improving with diuresis -  Continue duonebs -  Levofloxacin day 2/5 for COPD exacerbation -  Continue Budesonide BID  Klebsiella UTI,  -  Continue antibiotics day 2/5 of levofloxacin  Atrial fibrillation CHad2Vasc2 score= 5, rate still elevated to 110s -  A/C per cardiology - Rate control per cardiology, increased metoprolol today -  Appreciate cardiology assistance  Borderline hypotension, BP stable.  continue to hold amlodipine and doxazosin  Hypothyroidism - stable.  continue levothyroxine 50 mcg every morning  Diabetes mellitus - HbA1c 5.7, FS well controlled.  Continue SSI  Osteoporosis - hold calcium vitamin D for now  Cryptogenic anemia - workup in process by oncology-continue ferrous sulfate  Hyperlipidemia - Hold omega-3 fatty acids for now  Depression/sleep - continue mirtazapine 15 each bedtime   Code Status:  full Family Communication:  none at bedside Disposition Plan:  Pending  improvement in resp distress, HR   Brief narrative: 79 year old female, known history atrial fibrillation, chronic diastolic CHF ef 123456 [grade 2 dd] +mild AoS Echo 12/2012,  PVD, hypertension admitted from physician office w/ Acute Renal failure [basleine creat 1.6--->4.0], Normocytic anemia  Past medical history-As per Problem list Chart reviewed as below- 10/21 -FEV1 percent of 63 10/22-c Afib Chad2vasc=5, recnetly started Eliquis 2.5 bid, Toprol Xl 12.5 bid Sees Hem for Anemia and TCP [work up for Multiple myeloma; haptoglobin, DAT, von Willebrand panel, PT, aPTT]? Admission 05/10/06 for AUGIB Admission 08/07/05 for Free Air Admission 05/18/02 for Multiple #'s Admission 11/17/99 for Ischemic L foot  Consultants:  Dr. Radford Alvarez, Cardiology  Dr. Mercy Alvarez, Nephrology  Procedures:  CXR  Antibiotics:  none  Subjective:  SOB improving somewhat today.  Denies chest pain, nausea, vomiting, diarrhea.  Has not had a BM in the last 24 hours but does not feel constipated    Objective     Telemetry:   atrial fibrillation, rate > 100 up to the 150s  Objective: Filed Vitals:   01/10/13 2118 01/11/13 0519 01/11/13 0730 01/11/13 0840  BP: 134/54 133/65  136/68  Pulse: 105 93  94  Temp: 97.7 F (36.5 C) 97.3 F (36.3 C)  96.4 F (35.8 C)  TempSrc: Oral Oral  Oral  Resp: 18 18  19   Height:      Weight:  90.719 kg (200 lb)    SpO2: 96% 94% 95% 95%    Intake/Output Summary (Last 24 hours) at 01/11/13 1233 Last data filed at 01/11/13 1007  Gross per 24 hour  Intake    960 ml  Output   1500 ml  Net   -540 ml     Exam:  General:  CF, mild respiratory distress with occasional SCM retractions with exertion HEENT: EOMI, NCAT poor dentition Cardiovascular:  Rate in the 110s, S1-S2 irregularly irregular  Respiratory:  Diminished bilateral BS with full high pitched exp wheeze without focal rales or rhonchi Abdomen:  soft nontender nondistended MSK:  2+ bilateral slow pitting  edema lower extremities NeuroRange of motion intact, neurologically intact, moving all 4 limbs equally  Data Reviewed: Basic Metabolic Panel:  Recent Labs Lab 01/06/13 2231 01/07/13 0405 01/08/13 0555 01/09/13 0615 01/11/13 0452  NA 144 141 141 140 142  K 4.3 3.9 3.9 3.7 5.0  CL 103 102 105 107 105  CO2 26 26 24 22 25   GLUCOSE 128* 104* 111* 101* 242*  BUN 45* 45* 39* 34* 33*  CREATININE 4.09* 4.16* 3.77* 3.11* 2.67*  CALCIUM 10.3 10.0 9.1 8.7 9.0  PHOS  --   --  4.0  --   --    Liver Function Tests:  Recent Labs Lab 01/06/13 2231 01/08/13 0555  AST 21  --   ALT 14  --   ALKPHOS 51  --   BILITOT 0.3  --   PROT 6.5  --   ALBUMIN 3.5 3.0*   No results found for this basename: LIPASE, AMYLASE,  in the last 168 hours No results found for this basename: AMMONIA,  in the last 168 hours CBC:  Recent Labs Lab 01/06/13 2231 01/07/13 0405 01/08/13 0555 01/10/13 0605 01/11/13 0452  WBC 4.2 4.0 4.2 5.6 3.8*  NEUTROABS 2.8  --   --   --   --   HGB 9.5* 8.9* 9.2* 9.3* 10.0*  HCT 29.1* 26.6* 27.4* 28.4* 30.1*  MCV 95.4 95.0 94.5 96.9 95.6  PLT 153 139* 154 150 164   Cardiac Enzymes: No results found for this basename: CKTOTAL, CKMB, CKMBINDEX, TROPONINI,  in the last 168 hours BNP: No components found with this basename: POCBNP,  CBG:  Recent Labs Lab 01/10/13 1104 01/10/13 1624 01/10/13 2108 01/11/13 0545 01/11/13 1131  GLUCAP 119* 171* 230* 236* 311*    Recent Results (from the past 240 hour(s))  URINE CULTURE     Status: None   Collection Time    01/07/13  6:26 AM      Result Value Range Status   Specimen Description URINE, CLEAN CATCH   Final   Special Requests NONE   Final   Culture  Setup Time     Final   Value: 01/07/2013 19:19     Performed at [facility]     Final   Value: >=100,000 COLONIES/ML     Performed at [facility]   Culture     Final   Value: KLEBSIELLA PNEUMONIAE     Performed at [facility]   Report Status 01/09/2013 FINAL   Final   Organism ID, Bacteria KLEBSIELLA PNEUMONIAE   Final     Studies:              All Imaging reviewed and is as per above notation   Scheduled Meds: . ipratropium  0.5 mg Nebulization TID   And  . albuterol  2.5 mg Nebulization TID  . budesonide (PULMICORT) nebulizer solution  0.5 mg Nebulization BID  . enoxaparin (LOVENOX) injection  90 mg Subcutaneous Q24H  . ferrous sulfate  325 mg  Oral TID WC  . furosemide  40 mg Intravenous Daily  . insulin aspart  0-5 Units Subcutaneous QHS  . insulin aspart  0-9 Units Subcutaneous TID WC  . [START ON 01/12/2013] levofloxacin (LEVAQUIN) IV  500 mg Intravenous Q48H  . levothyroxine  50 mcg Oral QAC breakfast  . metoprolol succinate  75 mg Oral Daily  . mirtazapine  15 mg Oral Daily  . omega-3 acid ethyl esters  1 g Oral BID  . predniSONE  60 mg Oral Q breakfast  . warfarin  3 mg Oral ONCE-1800  . Warfarin - Pharmacist Dosing Inpatient   Does not apply q1800   Continuous Infusions:     Janece Canterbury, MD  [facility] Pager [phone]  01/11/2013, 12:33 PM    LOS: 5 days

## 2013-01-11 NOTE — Progress Notes (Signed)
Co-signed for [employer] RN/BSN for assessments, IV assessments, medication administration, progress notes, patient education, I's and O's, and vital signs. Tonny Branch, RN/BSN

## 2013-01-11 NOTE — Progress Notes (Signed)
NUTRITION FOLLOW UP  Intervention:   1. Continue to encourage oral intake.  2. No nutrition supplements at this time, patient refusal 3. RD to continue to follow nutrition plan of care.   Nutrition Dx:   Inadequate oral intake related to poor appetite and taste changes as evidenced by pt report, improved  Goal:   Patient will meet >/=90% of estimated nutrition needs, meeting  Monitor:   PO intake, weight, labs, I/Os  Assessment:   PMHx significant for DM, HTN, CKD, COPD, CA, . Admitted with acute on chronic renal failure from MD office.  Patient is eating very well, eating 100% of her Heart Healthy meals. She continues to decline oral nutrition supplement.   +1 Pitting edema, weight is up 9 pounds since 11/1, likely related to fluid.   Height: Ht Readings from Last 1 Encounters:  01/07/13 5\' 7"  (1.702 m)    Weight Status:   Wt Readings from Last 1 Encounters:  01/11/13 200 lb (90.719 kg)    Re-estimated needs:  Kcal: 1700 - 1900  Protein: 65 - 75 g  Fluid: 1.7 - 1.9 liters  Skin: 1+ pitting edema  Diet Order: Cardiac   Intake/Output Summary (Last 24 hours) at 01/11/13 1514 Last data filed at 01/11/13 1412  Gross per 24 hour  Intake    940 ml  Output   1550 ml  Net   -610 ml    Last BM: 11/1   Labs:   Recent Labs Lab 01/07/13 0405 01/08/13 0555 01/09/13 0615 01/11/13 0452  NA 141 141 140 142  K 3.9 3.9 3.7 5.0  CL 102 105 107 105  CO2 26 24 22 25   BUN 45* 39* 34* 33*  CREATININE 4.16* 3.77* 3.11* 2.67*  CALCIUM 10.0 9.1 8.7 9.0  PHOS  --  4.0  --   --   GLUCOSE 104* 111* 101* 242*    CBG (last 3)   Recent Labs  01/10/13 2108 01/11/13 0545 01/11/13 1131  GLUCAP 230* 236* 311*    Scheduled Meds: . ipratropium  0.5 mg Nebulization TID   And  . albuterol  2.5 mg Nebulization TID  . budesonide (PULMICORT) nebulizer solution  0.5 mg Nebulization BID  . enoxaparin (LOVENOX) injection  90 mg Subcutaneous Q24H  . ferrous sulfate  325 mg  Oral TID WC  . furosemide  80 mg Intravenous BID  . insulin aspart  0-5 Units Subcutaneous QHS  . insulin aspart  0-9 Units Subcutaneous TID WC  . [START ON 01/12/2013] levofloxacin (LEVAQUIN) IV  500 mg Intravenous Q48H  . levothyroxine  50 mcg Oral QAC breakfast  . metoprolol succinate  75 mg Oral Daily  . mirtazapine  15 mg Oral Daily  . omega-3 acid ethyl esters  1 g Oral BID  . [START ON 01/12/2013] predniSONE  50 mg Oral Q breakfast  . warfarin  3 mg Oral ONCE-1800  . Warfarin - Pharmacist Dosing Inpatient   Does not apply q1800    Continuous Infusions:   Larey Seat, RD, LDN Pager #: [phone] After-Hours Pager #: [phone]

## 2013-01-12 ENCOUNTER — Inpatient Hospital Stay (HOSPITAL_COMMUNITY): Payer: Medicare Other

## 2013-01-12 DIAGNOSIS — I359 Nonrheumatic aortic valve disorder, unspecified: Secondary | ICD-10-CM

## 2013-01-12 LAB — GLUCOSE, CAPILLARY
Glucose-Capillary: 239 mg/dL — ABNORMAL HIGH (ref 70–99)
Glucose-Capillary: 160 mg/dL — ABNORMAL HIGH (ref 70–99)
Glucose-Capillary: 180 mg/dL — ABNORMAL HIGH (ref 70–99)

## 2013-01-12 LAB — CBC
HCT: 31 % — ABNORMAL LOW (ref 36.0–46.0)
Hemoglobin: 10.2 g/dL — ABNORMAL LOW (ref 12.0–15.0)
MCH: 31.4 pg (ref 26.0–34.0)
MCHC: 32.9 g/dL (ref 30.0–36.0)
MCV: 95.4 fL (ref 78.0–100.0)
Platelets: 198 K/uL (ref 150–400)
RBC: 3.25 MIL/uL — ABNORMAL LOW (ref 3.87–5.11)
RDW: 14.6 % (ref 11.5–15.5)
WBC: 8.7 K/uL (ref 4.0–10.5)

## 2013-01-12 LAB — PROTIME-INR
INR: 1.52 — ABNORMAL HIGH (ref 0.00–1.49)
Prothrombin Time: 17.9 s — ABNORMAL HIGH (ref 11.6–15.2)

## 2013-01-12 LAB — RENAL FUNCTION PANEL
CO2: 25 mEq/L (ref 19–32)
GFR calc Af Amer: 16 mL/min — ABNORMAL LOW (ref >90)
Glucose, Bld: 181 mg/dL — ABNORMAL HIGH (ref 70–99)
Potassium: 4.4 mEq/L (ref 3.5–5.1)
Sodium: 141 mEq/L (ref 135–145)

## 2013-01-12 MED ORDER — METOPROLOL TARTRATE 50 MG PO TABS
50.0000 mg | ORAL_TABLET | Freq: Two times a day (BID) | ORAL | Status: DC
  Administered 2013-01-13: 50 mg via ORAL
  Filled 2013-01-12 (×2): qty 1

## 2013-01-12 MED ORDER — INSULIN ASPART 100 UNIT/ML ~~LOC~~ SOLN
4.0000 [IU] | Freq: Three times a day (TID) | SUBCUTANEOUS | Status: DC
  Administered 2013-01-12 – 2013-01-20 (×22): 4 [IU] via SUBCUTANEOUS

## 2013-01-12 MED ORDER — METOPROLOL TARTRATE 50 MG PO TABS
50.0000 mg | ORAL_TABLET | Freq: Once | ORAL | Status: DC
  Filled 2013-01-12: qty 1

## 2013-01-12 MED ORDER — METOPROLOL TARTRATE 50 MG PO TABS
50.0000 mg | ORAL_TABLET | Freq: Once | ORAL | Status: AC
  Administered 2013-01-12: 50 mg via ORAL
  Filled 2013-01-12: qty 1

## 2013-01-12 MED ORDER — PREDNISONE 20 MG PO TABS
40.0000 mg | ORAL_TABLET | Freq: Every day | ORAL | Status: DC
  Administered 2013-01-13 – 2013-01-15 (×3): 40 mg via ORAL
  Filled 2013-01-12 (×4): qty 2

## 2013-01-12 MED ORDER — BISACODYL 10 MG RE SUPP
10.0000 mg | Freq: Every day | RECTAL | Status: DC | PRN
  Filled 2013-01-12: qty 1

## 2013-01-12 MED ORDER — WARFARIN SODIUM 3 MG PO TABS
3.0000 mg | ORAL_TABLET | Freq: Once | ORAL | Status: AC
  Administered 2013-01-12: 18:00:00 3 mg via ORAL
  Filled 2013-01-12: qty 1

## 2013-01-12 MED ORDER — ALBUTEROL SULFATE (5 MG/ML) 0.5% IN NEBU: 2.5000 mg | INHALATION_SOLUTION | RESPIRATORY_TRACT | Status: DC | PRN

## 2013-01-12 MED ORDER — LEVOFLOXACIN IN D5W 500 MG/100ML IV SOLN: 500.0000 mg | INTRAVENOUS | Status: DC

## 2013-01-12 MED ORDER — ALBUTEROL SULFATE (5 MG/ML) 0.5% IN NEBU
2.5000 mg | INHALATION_SOLUTION | Freq: Two times a day (BID) | RESPIRATORY_TRACT | Status: DC
  Administered 2013-01-12 – 2013-01-20 (×16): 2.5 mg via RESPIRATORY_TRACT
  Filled 2013-01-12 (×17): qty 0.5

## 2013-01-12 MED ORDER — IPRATROPIUM BROMIDE 0.02 % IN SOLN
0.5000 mg | Freq: Two times a day (BID) | RESPIRATORY_TRACT | Status: DC
  Administered 2013-01-12 – 2013-01-20 (×16): 0.5 mg via RESPIRATORY_TRACT
  Filled 2013-01-12 (×17): qty 2.5

## 2013-01-12 MED ORDER — SODIUM CHLORIDE 0.9 % IV SOLN
1020.0000 mg | Freq: Once | INTRAVENOUS | Status: AC
  Administered 2013-01-12: 12:00:00 1020 mg via INTRAVENOUS
  Filled 2013-01-12: qty 34

## 2013-01-12 MED ORDER — FUROSEMIDE 80 MG PO TABS
80.0000 mg | ORAL_TABLET | Freq: Two times a day (BID) | ORAL | Status: DC
  Administered 2013-01-12 – 2013-01-15 (×6): 80 mg via ORAL
  Filled 2013-01-12 (×9): qty 1

## 2013-01-12 NOTE — Progress Notes (Signed)
ZERLINE SPIVA [id number] DOB: [birth year] DOA: 01/06/2013 PCP: Maurice Small, D, MD   Assessment/Plan:  Acute renal failure, possible due to lotensin, Lasix, baseline creatinine 1.6, may have had some AKI secondary to dehydration.  Initial UA with hyaline casts.  Creatinine rose again today -  Appreciate nephrology assistance -  Renally dose medications  -  Minimize nephrotoxins -  Renal duplex per nephrology   Acute hypoxic resp failure due to acute on chronic diastolic heart failure and possible COPD exac Acute on chronic diastolic heart failure, BNP elevated and pulm edema on CXR -  -32m L despite increased lasix yesterday -  Continue lasix to 80mg  IV BID -  ECHO completed 2 days ago  Moderate right pleural effusion, likely due to acute diastolic CHF exacerbation and hydration in the setting of AKI -  Repeat CXR -  If persistent and not improving with diuresis, consider thoracentesis -  Will need close outpatient follow up by Dr. [name], pulmonology, particularly if not improving  Acute COPD exacerbation -  Fast taper since improving with diuresis -  Continue duonebs -  Levofloxacin day 3/5 for COPD exacerbation -  Continue Budesonide BID  Klebsiella UTI,  -  Continue antibiotics day 3/5 of levofloxacin  Atrial fibrillation CHad2Vasc2 score= 5, rate still elevated to 140s, asymptomatic -  A/C per cardiology -  Spoke with Dr. Tamala Julian this morning.  Do not feel comfortable giving dilt or metop given BP and do not feel comf with dig given AKI.  Asked about amiodarone as she has been A/C, however, they will come to assess patient as long as she is asymptomatic and decide themselves.   -  Appreciate cardiology assistance  Borderline hypotension, BP stable.  continue to hold amlodipine and doxazosin  Hypothyroidism - stable.  continue levothyroxine 50 mcg every morning  Diabetes mellitus - HbA1c 5.7, FS high due to steroids, but tapering quickly.   -  Continue  SSI  Osteoporosis - hold calcium vitamin D for now  Cryptogenic anemia - workup in process by oncology-continue ferrous sulfate  Hyperlipidemia - Hold omega-3 fatty acids for now  Depression/sleep - continue mirtazapine 15 each bedtime   Code Status:  full Family Communication:  none at bedside Disposition Plan:  Pending improvement in resp distress, HR   Brief narrative: 79 year old female, known history atrial fibrillation, chronic diastolic CHF ef 123456 [grade 2 dd] +mild AoS Echo 12/2012,  PVD, hypertension admitted from physician office w/ Acute Renal failure [basleine creat 1.6--->4.0], Normocytic anemia  Past medical history-As per Problem list Chart reviewed as below- 10/21 -FEV1 percent of 63 10/22-c Afib Chad2vasc=5, recnetly started Eliquis 2.5 bid, Toprol Xl 12.5 bid Sees Hem for Anemia and TCP [work up for Multiple myeloma; haptoglobin, DAT, von Willebrand panel, PT, aPTT]? Admission 05/10/06 for AUGIB Admission 08/07/05 for Free Air Admission 05/18/02 for Multiple #'s Admission 11/17/99 for Ischemic L foot  Consultants:  Dr. [name], Cardiology  Dr. Mercy Moore, Nephrology  Procedures:  CXR  Antibiotics:  none  Subjective:  SOB about the same today.  Still SOB when reclining..  Denies chest pain, nausea, vomiting, diarrhea.  Has not had a BM in the last 48 hours    Objective     Telemetry:   atrial fibrillation, rate > 110s up to the 150s  Objective: Filed Vitals:   01/12/13 0602 01/12/13 0740 01/12/13 0833 01/12/13 0856  BP: 132/63 101/56    Pulse:  116    Temp: 97.8 F (36.6 C)  TempSrc: Oral     Resp: 18  20   Height:      Weight:    91.2 kg (201 lb 1 oz)  SpO2: 95%       Intake/Output Summary (Last 24 hours) at 01/12/13 0927 Last data filed at 01/12/13 0856  Gross per 24 hour  Intake   1020 ml  Output   1490 ml  Net   -470 ml     Exam:  General:  CF, mild respiratory distress with occasional SCM retractions with  exertion HEENT: EOMI, NCAT poor dentition Cardiovascular:  Rate in the 130s, S1-S2 irregularly irregular  Respiratory:  Diminished bilateral BS with full high pitched exp wheeze without focal rales or rhonchi Abdomen:  soft nontender nondistended MSK:  2+ bilateral slow pitting edema lower extremities NeuroRange of motion intact, neurologically intact, moving all 4 limbs equally  Data Reviewed: Basic Metabolic Panel:  Recent Labs Lab 01/07/13 0405 01/08/13 0555 01/09/13 0615 01/11/13 0452 01/12/13 0535  NA 141 141 140 142 141  K 3.9 3.9 3.7 5.0 4.4  CL 102 105 107 105 103  CO2 26 24 22 25 25   GLUCOSE 104* 111* 101* 242* 181*  BUN 45* 39* 34* 33* 41*  CREATININE 4.16* 3.77* 3.11* 2.67* 3.00*  CALCIUM 10.0 9.1 8.7 9.0 9.2  PHOS  --  4.0  --   --  4.1   Liver Function Tests:  Recent Labs Lab 01/06/13 2231 01/08/13 0555 01/12/13 0535  AST 21  --   --   ALT 14  --   --   ALKPHOS 51  --   --   BILITOT 0.3  --   --   PROT 6.5  --   --   ALBUMIN 3.5 3.0* 3.3*   No results found for this basename: LIPASE, AMYLASE,  in the last 168 hours No results found for this basename: AMMONIA,  in the last 168 hours CBC:  Recent Labs Lab 01/06/13 2231 01/07/13 0405 01/08/13 0555 01/10/13 0605 01/11/13 0452 01/12/13 0535  WBC 4.2 4.0 4.2 5.6 3.8* 8.7  NEUTROABS 2.8  --   --   --   --   --   HGB 9.5* 8.9* 9.2* 9.3* 10.0* 10.2*  HCT 29.1* 26.6* 27.4* 28.4* 30.1* 31.0*  MCV 95.4 95.0 94.5 96.9 95.6 95.4  PLT 153 139* 154 150 164 198   Cardiac Enzymes: No results found for this basename: CKTOTAL, CKMB, CKMBINDEX, TROPONINI,  in the last 168 hours BNP: No components found with this basename: POCBNP,  CBG:  Recent Labs Lab 01/11/13 1131 01/11/13 1556 01/11/13 1740 01/11/13 2121 01/12/13 0640  GLUCAP 311* 234* 237* 239* 160*    Recent Results (from the past 240 hour(s))  URINE CULTURE     Status: None   Collection Time    01/07/13  6:26 AM      Result Value Range  Status   Specimen Description URINE, CLEAN CATCH   Final   Special Requests NONE   Final   Culture  Setup Time     Final   Value: 01/07/2013 19:19     Performed at [facility]     Final   Value: >=100,000 COLONIES/ML     Performed at [facility]   Culture     Final   Value: KLEBSIELLA PNEUMONIAE     Performed at [facility]   Report Status 01/09/2013 FINAL   Final   Organism ID, Bacteria  KLEBSIELLA PNEUMONIAE   Final     Studies:              All Imaging reviewed and is as per above notation   Scheduled Meds: . ipratropium  0.5 mg Nebulization BID   And  . albuterol  2.5 mg Nebulization BID  . budesonide (PULMICORT) nebulizer solution  0.5 mg Nebulization BID  . enoxaparin (LOVENOX) injection  90 mg Subcutaneous Q24H  . ferrous sulfate  325 mg Oral TID WC  . furosemide  80 mg Intravenous BID  . insulin aspart  0-5 Units Subcutaneous QHS  . insulin aspart  0-9 Units Subcutaneous TID WC  . insulin aspart  4 Units Subcutaneous TID WC  . [START ON 01/14/2013] levofloxacin (LEVAQUIN) IV  500 mg Intravenous Q48H  . levothyroxine  50 mcg Oral QAC breakfast  . metoprolol succinate  75 mg Oral Daily  . mirtazapine  15 mg Oral Daily  . omega-3 acid ethyl esters  1 g Oral BID  . predniSONE  50 mg Oral Q breakfast  . Warfarin - Pharmacist Dosing Inpatient   Does not apply q1800   Continuous Infusions:     Janece Canterbury, MD  [facility] Pager [phone]  01/12/2013, 9:27 AM    LOS: 6 days

## 2013-01-12 NOTE — Progress Notes (Signed)
S: No new CO.  PO intake improved O:BP 101/56  Pulse 116  Temp(Src) 97.8 F (36.6 C) (Oral)  Resp 20  Ht 5\' 7"  (1.702 m)  Wt 91.2 kg (201 lb 1 oz)  BMI 31.48 kg/m2  SpO2 95%  Intake/Output Summary (Last 24 hours) at 01/12/13 0943 Last data filed at 01/12/13 0856  Gross per 24 hour  Intake   1020 ml  Output   1490 ml  Net   -470 ml   Weight change:  [id number] and alert [id number], irreg Resp:decreased BS rt base Abd:+ BS NTND Ext:tr-1+ edema NEURO:CNI Ox3 no asterixis   . ipratropium  0.5 mg Nebulization BID   And  . albuterol  2.5 mg Nebulization BID  . budesonide (PULMICORT) nebulizer solution  0.5 mg Nebulization BID  . enoxaparin (LOVENOX) injection  90 mg Subcutaneous Q24H  . ferrous sulfate  325 mg Oral TID WC  . furosemide  80 mg Intravenous BID  . insulin aspart  0-5 Units Subcutaneous QHS  . insulin aspart  0-9 Units Subcutaneous TID WC  . insulin aspart  4 Units Subcutaneous TID WC  . [START ON 01/14/2013] levofloxacin (LEVAQUIN) IV  500 mg Intravenous Q48H  . levothyroxine  50 mcg Oral QAC breakfast  . metoprolol succinate  75 mg Oral Daily  . mirtazapine  15 mg Oral Daily  . omega-3 acid ethyl esters  1 g Oral BID  . [START ON 01/13/2013] predniSONE  40 mg Oral Q breakfast  . Warfarin - Pharmacist Dosing Inpatient   Does not apply q1800   No results found. BMET    Component Value Date/Time   NA 141 01/12/2013 0535   NA 149* 11/14/2012 1206   K 4.4 01/12/2013 0535   K 3.6 11/14/2012 1206   CL 103 01/12/2013 0535   CO2 25 01/12/2013 0535   CO2 30* 11/14/2012 1206   GLUCOSE 181* 01/12/2013 0535   GLUCOSE 134 11/14/2012 1206   BUN 41* 01/12/2013 0535   BUN 30.3* 11/14/2012 1206   CREATININE 3.00* 01/12/2013 0535   CREATININE 1.7* 11/14/2012 1206   CALCIUM 9.2 01/12/2013 0535   CALCIUM 10.1 11/14/2012 1206   GFRNONAA 14* 01/12/2013 0535   GFRAA 16* 01/12/2013 0535   CBC    Component Value Date/Time   WBC 8.7 01/12/2013 0535   WBC 4.5 11/14/2012 1206   RBC 3.25*  01/12/2013 0535   RBC 2.98* 11/14/2012 1206   HGB 10.2* 01/12/2013 0535   HGB 9.3* 11/14/2012 1206   HCT 31.0* 01/12/2013 0535   HCT 28.6* 11/14/2012 1206   PLT 198 01/12/2013 0535   PLT 121* 11/14/2012 1206   MCV 95.4 01/12/2013 0535   MCV 96.0 11/14/2012 1206   MCH 31.4 01/12/2013 0535   MCH 31.2 11/14/2012 1206   MCHC 32.9 01/12/2013 0535   MCHC 32.5 11/14/2012 1206   RDW 14.6 01/12/2013 0535   RDW 15.0* 11/14/2012 1206   LYMPHSABS 1.0 01/06/2013 2231   LYMPHSABS 1.0 11/14/2012 1206   MONOABS 0.3 01/06/2013 2231   MONOABS 0.2 11/14/2012 1206   EOSABS 0.1 01/06/2013 2231   EOSABS 0.2 11/14/2012 1206   BASOSABS 0.0 01/06/2013 2231   BASOSABS 0.0 11/14/2012 1206     Assessment: 1.  Acute on CKD 3 in setting of poor po intake and ACE.  Scr was improving but today sl higher. UO good. 2. Klebsiella UTI 3. DM 4. HTN 5. Rt pleural effusion 6. Anemia and iron def  Plan:  1.  IV Iron  2. Cont lasix but she should be able to take PO 3.  Renal art doppler today 4. Recheck Scr in AM   Abigial Newville T

## 2013-01-12 NOTE — Progress Notes (Signed)
Spoke to cardiologist about patient's heart sustaining in the 130-140s. Patient's heart rate increased as high as 167. MD made aware. Patient is asymptomatic with no verbal complaints and no signs or symptoms of distress or discomfort. BP is 126/83 and HR at that time was 115. Will continue to monitor patient for further changes in condition.

## 2013-01-12 NOTE — Progress Notes (Signed)
Paged MD about sustaining heart rate. No response.

## 2013-01-12 NOTE — Progress Notes (Signed)
Paged MD again. Again no response. Will re-try pager and continue to monitor patient for further changes in condition. Patient is asymptomatic. No signs or symptoms of distress or discomfort. No verbal complaints.

## 2013-01-12 NOTE — Progress Notes (Signed)
       Patient Name: Jackie Alvarez Date of Encounter: 01/12/2013    SUBJECTIVE: She was taking a bath when I came by on rounds. Heart rate was in the 120s. The bath was a basin bath. She denies dyspnea. She is not lightheaded or dizzy. No chest pain.  TELEMETRY:  Atrial fibrillation with average rate in the 100-110 range. Some heart rates as high as 140 beats per minute with activity Filed Vitals:   01/12/13 0740 01/12/13 0833 01/12/13 0856 01/12/13 1151  BP: 101/56   126/83  Pulse: 116   115  Temp:      TempSrc:      Resp:  20    Height:      Weight:   201 lb 1 oz (91.2 kg)   SpO2:        Intake/Output Summary (Last 24 hours) at 01/12/13 1349 Last data filed at 01/12/13 1200  Gross per 24 hour  Intake    780 ml  Output   1990 ml  Net  -1210 ml    I/O:  + 4146 L since admission  LABS: Basic Metabolic Panel:  Recent Labs  01/11/13 0452 01/12/13 0535  NA 142 141  K 5.0 4.4  CL 105 103  CO2 25 25  GLUCOSE 242* 181*  BUN 33* 41*  CREATININE 2.67* 3.00*  CALCIUM 9.0 9.2  PHOS  --  4.1   CBC:  Recent Labs  01/11/13 0452 01/12/13 0535  WBC 3.8* 8.7  HGB 10.0* 10.2*  HCT 30.1* 31.0*  MCV 95.6 95.4  PLT 164 198   Radiology/Studies:  No new data  Physical Exam: Blood pressure 126/83, pulse 115, temperature 97.8 F (36.6 C), temperature source Oral, resp. rate 20, height 5\' 7"  (1.702 m), weight 201 lb 1 oz (91.2 kg), SpO2 95.00%. Weight change:    Decreased breath sounds at both bases Irregularly irregular rhythm Lower extremity trace edema  ASSESSMENT:  1. Chronic atrial fibrillation with relatively poor rate control 2. Acute on chronic kidney disease, stage III-IV 3. Overall volume overload aggravating diastolic heart failure and causing an acute on chronic picture. 4. COPD  Plan:  1. Further titrate beta blocker therapy to control ventricular response 2. Diuresis as tolerated by kidney function  Signed, Sinclair Grooms 01/12/2013, 1:49  PM

## 2013-01-12 NOTE — Consult Note (Signed)
PHARMACY CONSULT NOTE  Pharmacy Consult:  Coumadin with Lovenox bridging  Indication: atrial fibrillation  Allergies: Allergies  Allergen Reactions  . Metformin And Related     Renal insuff  . Omnipaque [Iohexol] Nausea And Vomiting    unknown    Height/Weight: Height: 5\' 7"  (170.2 cm) Weight: 200 lb (90.719 kg) (scale a) IBW/kg (Calculated) : 61.6 Dosing weight 91.7 kg  Vital Signs: BP 101/56  Pulse 116  Temp(Src) 97.8 F (36.6 C) (Oral)  Resp 20  Ht 5\' 7"  (1.702 m)  Wt 200 lb (90.719 kg)  BMI 31.32 kg/m2  SpO2 95%  Active Problems: Principal Problem:   Acute on chronic renal failure Active Problems:   Peripheral vascular disease, unspecified   COPD (chronic obstructive pulmonary disease) with emphysema   Thyroid disease   Anemia   HTN (hypertension)   Chronic diastolic heart failure   Acute respiratory failure with hypoxia   COPD with acute exacerbation   Acute on chronic diastolic heart failure   Chronic atrial fibrillation   Labs:  Recent Labs  01/10/13 0605 01/10/13 1028 01/11/13 0452 01/12/13 0535  HGB 9.3*  --  10.0* 10.2*  HCT 28.4*  --  30.1* 31.0*  PLT 150  --  164 198  LABPROT  --  15.5* 16.1* 17.9*  INR  --  1.26 1.32 1.52*  CREATININE  --   --  2.67* 3.00*   Estimated Creatinine Clearance: 17.6 ml/min (by C-G formula based on Cr of 3).  Assessment: 81 YOF Lovenox bridging to coumadin for afib. INR 1.52. Hgb 10.2.  Plts 198.  No bleeding complications noted. She is on D#3/5 levaquin.  Goal of Therapy:   INR 2-3  Anti-Xa level 0.6-1.2 units/ml 4hrs after LMWH dose given      Plan:   Coumadin 3 mg today.  Continue bridging with lovenox 90mg  sq q 24 hrs Daily INR's, CBC.  Monitor for bleeding complications.   Maryanna Shape, PharmD, BCPS  Clinical Pharmacist  Pager: [phone]   01/12/2013,  8:41 AM

## 2013-01-12 NOTE — Progress Notes (Signed)
Called MD office received on call MD pager number and paged. Wrong MD on call. Was informed that all cardiologists were in a meeting and that the MD following that case will return my page. Will continue to monitor patient for further changes in condition.

## 2013-01-12 NOTE — Progress Notes (Signed)
pts HR in 120's and sustaining, pt asymptomatic, Dr. Tamala Julian notified and 1 x 50 of metoprolol given, will continue to monitor

## 2013-01-12 NOTE — Progress Notes (Signed)
Made attending MD aware of HR that is currently still sustaining. MD stated if patient is asymptomatic, continue to monitor patient for further changes and wait on Cardiologist for new orders. Patient is still asymptomatic at this time. Will continue to monitor patient for further changes in condition.

## 2013-01-13 ENCOUNTER — Inpatient Hospital Stay (HOSPITAL_COMMUNITY): Payer: Medicare Other

## 2013-01-13 LAB — PARATHYROID HORMONE, INTACT (NO CA): PTH: 164.4 pg/mL — ABNORMAL HIGH (ref 14.0–72.0)

## 2013-01-13 LAB — GLUCOSE, CAPILLARY
Glucose-Capillary: 118 mg/dL — ABNORMAL HIGH (ref 70–99)
Glucose-Capillary: 115 mg/dL — ABNORMAL HIGH (ref 70–99)
Glucose-Capillary: 173 mg/dL — ABNORMAL HIGH (ref 70–99)
Glucose-Capillary: 127 mg/dL — ABNORMAL HIGH (ref 70–99)

## 2013-01-13 LAB — BASIC METABOLIC PANEL
BUN: 56 mg/dL — ABNORMAL HIGH (ref 6–23)
CO2: 27 mEq/L (ref 19–32)
Chloride: 102 mEq/L (ref 96–112)
Glucose, Bld: 119 mg/dL — ABNORMAL HIGH (ref 70–99)
Potassium: 4.2 mEq/L (ref 3.5–5.1)
Sodium: 142 mEq/L (ref 135–145)

## 2013-01-13 LAB — URINALYSIS, ROUTINE W REFLEX MICROSCOPIC
Glucose, UA: 250 mg/dL — AB
Ketones, ur: NEGATIVE mg/dL
Nitrite: NEGATIVE
Protein, ur: NEGATIVE mg/dL
pH: 5 (ref 5.0–8.0)

## 2013-01-13 LAB — PROTIME-INR: INR: 1.74 — ABNORMAL HIGH (ref 0.00–1.49)

## 2013-01-13 LAB — URINE MICROSCOPIC-ADD ON

## 2013-01-13 MED ORDER — WARFARIN SODIUM 3 MG PO TABS
3.0000 mg | ORAL_TABLET | Freq: Once | ORAL | Status: AC
  Administered 2013-01-13: 3 mg via ORAL
  Filled 2013-01-13: qty 1

## 2013-01-13 MED ORDER — METOPROLOL TARTRATE 100 MG PO TABS
100.0000 mg | ORAL_TABLET | Freq: Two times a day (BID) | ORAL | Status: DC
  Administered 2013-01-13 – 2013-01-20 (×14): 100 mg via ORAL
  Filled 2013-01-13 (×15): qty 1

## 2013-01-13 MED ORDER — LEVOFLOXACIN 500 MG PO TABS
500.0000 mg | ORAL_TABLET | ORAL | Status: AC
  Administered 2013-01-14: 09:00:00 500 mg via ORAL
  Filled 2013-01-13: qty 1

## 2013-01-13 NOTE — Progress Notes (Signed)
Pt a/o, no c/o pain, HR remains in 100-120's pt asymptomatic, pt will be NPO at midnight for renal US, did not go to day d/t pt ate lunch, pt stable, will continue to monitor

## 2013-01-13 NOTE — Progress Notes (Addendum)
S: No new CO.   O:BP 142/75  Pulse 59  Temp(Src) 97.5 F (36.4 C) (Oral)  Resp 18  Ht 5\' 7"  (1.702 m)  Wt 89.721 kg (197 lb 12.8 oz)  BMI 30.97 kg/m2  SpO2 93%  Intake/Output Summary (Last 24 hours) at 01/13/13 0906 Last data filed at 01/13/13 0800  Gross per 24 hour  Intake    440 ml  Output   2800 ml  Net  -2360 ml   Weight change:  [id number] and alert [id number], irreg Resp:decreased BS rt base Abd:+ BS NTND Ext:tr-1+ edema NEURO:CNI Ox3 no asterixis   . ipratropium  0.5 mg Nebulization BID   And  . albuterol  2.5 mg Nebulization BID  . budesonide (PULMICORT) nebulizer solution  0.5 mg Nebulization BID  . enoxaparin (LOVENOX) injection  90 mg Subcutaneous Q24H  . ferrous sulfate  325 mg Oral TID WC  . furosemide  80 mg Oral BID  . insulin aspart  0-5 Units Subcutaneous QHS  . insulin aspart  0-9 Units Subcutaneous TID WC  . insulin aspart  4 Units Subcutaneous TID WC  . [START ON 01/14/2013] levofloxacin (LEVAQUIN) IV  500 mg Intravenous Q48H  . levothyroxine  50 mcg Oral QAC breakfast  . metoprolol tartrate  50 mg Oral BID  . mirtazapine  15 mg Oral Daily  . omega-3 acid ethyl esters  1 g Oral BID  . predniSONE  40 mg Oral Q breakfast  . Warfarin - Pharmacist Dosing Inpatient   Does not apply q1800   Dg Chest Port 1 View  01/12/2013   CLINICAL DATA:  47-year- female with nonproductive cough and pleural effusion. Initial encounter.  EXAM: PORTABLE CHEST - 1 VIEW  COMPARISON:  01/10/2013 and earlier.  FINDINGS: Portable AP upright view at 1017 hr. Small to moderate right pleural effusion probably not significantly changed allowing for differences in patient positioning. Continued increased interstitial opacity. No pneumothorax. Persistent left retrocardiac opacity but no definite left effusion. Stable postoperative changes to the left axilla. Stable cardiac size and mediastinal contours.  IMPRESSION: Small to moderate right pleural effusion not significantly changed  allowing for different patient positioning. Right greater than left lower lobe collapse or consolidation. Continued vascular congestion.   Electronically Signed   By: Lars Pinks M.D.   On: 01/12/2013 17:36   BMET    Component Value Date/Time   NA 142 01/13/2013 0530   NA 149* 11/14/2012 1206   K 4.2 01/13/2013 0530   K 3.6 11/14/2012 1206   CL 102 01/13/2013 0530   CO2 27 01/13/2013 0530   CO2 30* 11/14/2012 1206   GLUCOSE 119* 01/13/2013 0530   GLUCOSE 134 11/14/2012 1206   BUN 56* 01/13/2013 0530   BUN 30.3* 11/14/2012 1206   CREATININE 3.20* 01/13/2013 0530   CREATININE 1.7* 11/14/2012 1206   CALCIUM 9.4 01/13/2013 0530   CALCIUM 10.1 11/14/2012 1206   GFRNONAA 13* 01/13/2013 0530   GFRAA 15* 01/13/2013 0530   CBC    Component Value Date/Time   WBC 8.7 01/12/2013 0535   WBC 4.5 11/14/2012 1206   RBC 3.25* 01/12/2013 0535   RBC 2.98* 11/14/2012 1206   HGB 10.2* 01/12/2013 0535   HGB 9.3* 11/14/2012 1206   HCT 31.0* 01/12/2013 0535   HCT 28.6* 11/14/2012 1206   PLT 198 01/12/2013 0535   PLT 121* 11/14/2012 1206   MCV 95.4 01/12/2013 0535   MCV 96.0 11/14/2012 1206   MCH 31.4 01/12/2013 0535   MCH 31.2  11/14/2012 1206   MCHC 32.9 01/12/2013 0535   MCHC 32.5 11/14/2012 1206   RDW 14.6 01/12/2013 0535   RDW 15.0* 11/14/2012 1206   LYMPHSABS 1.0 01/06/2013 2231   LYMPHSABS 1.0 11/14/2012 1206   MONOABS 0.3 01/06/2013 2231   MONOABS 0.2 11/14/2012 1206   EOSABS 0.1 01/06/2013 2231   EOSABS 0.2 11/14/2012 1206   BASOSABS 0.0 01/06/2013 2231   BASOSABS 0.0 11/14/2012 1206     Assessment: 1.  Acute on CKD 3 in setting of poor po intake and ACE.  Scr was improving but today sl higher. UO remains good. 2. Klebsiella UTI 3. DM 4. HTN 5. Rt pleural effusion 6. Anemia and iron def  Plan:  1.  Will get renal US along with doppler.  I called Vascular lab to find out why not done and they never received the order?.It went radiology? 2. Recheck UA 3 cont with lasix for now 4. Recheck Scr in AM   Cheston Coury T

## 2013-01-13 NOTE — Progress Notes (Signed)
       Patient Name: Jackie Alvarez Date of Encounter: 01/13/2013    SUBJECTIVE: She was in ultrasound when  I saw her.  She denies dyspnea. She is not lightheaded or dizzy. No chest pain.  TELEMETRY:  Atrial fibrillation with average rate in the 100-110 range. Some heart rates as high as 140 beats per minute with activity Filed Vitals:   01/13/13 0546 01/13/13 0852 01/13/13 1039 01/13/13 1441  BP: 142/75  136/67 125/77  Pulse: 59  120 93  Temp:    97.9 F (36.6 C)  TempSrc:    Oral  Resp: 18   18  Height:      Weight: 197 lb 12.8 oz (89.721 kg)     SpO2: 97% 93% 97% 97%    Intake/Output Summary (Last 24 hours) at 01/13/13 1638 Last data filed at 01/13/13 1439  Gross per 24 hour  Intake    700 ml  Output   2850 ml  Net  -2150 ml      LABS: Basic Metabolic Panel:  Recent Labs  01/12/13 0535 01/13/13 0530  NA 141 142  K 4.4 4.2  CL 103 102  CO2 25 27  GLUCOSE 181* 119*  BUN 41* 56*  CREATININE 3.00* 3.20*  CALCIUM 9.2 9.4  PHOS 4.1  --    CBC:  Recent Labs  01/11/13 0452 01/12/13 0535  WBC 3.8* 8.7  HGB 10.0* 10.2*  HCT 30.1* 31.0*  MCV 95.6 95.4  PLT 164 198   Radiology/Studies:  No new data  Physical Exam: Blood pressure 125/77, pulse 93, temperature 97.9 F (36.6 C), temperature source Oral, resp. rate 18, height 5\' 7"  (1.702 m), weight 197 lb 12.8 oz (89.721 kg), SpO2 97.00%. Weight change:    Decreased breath sounds at the right base Irregularly irregular rhythm Lower extremity trace edema  ASSESSMENT:  1. Chronic atrial fibrillation with relatively poor rate control 2. Acute on chronic kidney disease, stage III-IV 3. Overall volume overload aggravating diastolic heart failure and causing an acute on chronic picture.  THis is improved.  She appears euvolemic and is lying flat without difficulty. 4. COPD  Plan:  1. Further titrate beta blocker therapy to control ventricular response 2. Diuresis as tolerated by kidney  function  Signed, Griffey Nicasio S. 01/13/2013, 4:38 PM

## 2013-01-13 NOTE — Consult Note (Signed)
PHARMACY CONSULT NOTE  Pharmacy Consult:  Coumadin with Lovenox bridging/levaquin Indication: atrial fibrillation/COPD exacerbation and Klebsiella UTI  Allergies: Allergies  Allergen Reactions  . Metformin And Related     Renal insuff  . Omnipaque [Iohexol] Nausea And Vomiting    unknown    Height/Weight: Height: 5\' 7"  (170.2 cm) Weight: 197 lb 12.8 oz (89.721 kg) (a scale) IBW/kg (Calculated) : 61.6 Dosing weight 91.7 kg  Vital Signs: BP 142/75  Pulse 59  Temp(Src) 97.5 F (36.4 C) (Oral)  Resp 18  Ht 5\' 7"  (1.702 m)  Wt 197 lb 12.8 oz (89.721 kg)  BMI 30.97 kg/m2  SpO2 93%  Labs:  Recent Labs  01/11/13 0452 01/12/13 0535 01/13/13 0530  HGB 10.0* 10.2*  --   HCT 30.1* 31.0*  --   PLT 164 198  --   LABPROT 16.1* 17.9* 19.8*  INR 1.32 1.52* 1.74*  CREATININE 2.67* 3.00* 3.20*   Estimated Creatinine Clearance: 16.4 ml/min (by C-G formula based on Cr of 3.2).  Assessment: 81 YOF Lovenox bridging to coumadin for afib. INR 1.74 this morning, trending up nicely. No new cbc, no bleeding complications noted.   She is on D#4/5 levaquin for COPD exacerbation and Klebsiella UTI, afebrile, wbc 8.7, scr remains elevated at 3.2, with est. crcl ~ 15 ml/min.   LVQ 11/4 >> (11/8)  11/1 Urine Klebsiella Pneumoniae (R- amp, nitro; S - ancef, rocephin, cipro, LVQ, zosyn, gent, bactrim)  Goal of Therapy:   INR 2-3  Anti-Xa level 0.6-1.2 units/ml 4hrs after LMWH dose given      Plan:   Coumadin 3 mg today.  Continue bridging with lovenox 90mg  sq q 24 hrs Daily INR's, CBC.  Monitor for bleeding complications.  Change Levaquin to po 500mg  Q 48 hrs (only one more dose left tomorrow to finish 5 day course)  Maryanna Shape, PharmD, BCPS  Clinical Pharmacist  Pager: [phone]   01/13/2013,  9:04 AM

## 2013-01-13 NOTE — Progress Notes (Signed)
Physical Therapy Treatment Patient Details Name: Jackie Alvarez MRN: [id number] DOB: Sep 07, 1931 Today's Date: 01/13/2013 Time: [id number] PT Time Calculation (min): 15 min  PT Assessment / Plan / Recommendation  History of Present Illness Jackie Alvarez is a 79 y.o. female who presents to the ED after lab results this week revealed rapid worsening of her renal function  She had been seen in her PCP's office 3 days ago and had labs done which revealed an increase in her BUN and Cr to 35/3.5 and she was told to stop her Lotensin Rx, and her Lasix Rx.    She had a repeat check on Friday and the numbers were further elevated at  41/4.2  and she was referred to the ED for evalauation.    PT Comments   Patient making gains with mobility, balance, gait.  Follow Up Recommendations  Home health PT     Does the patient have the potential to tolerate intense rehabilitation     Barriers to Discharge        Equipment Recommendations  None recommended by PT    Recommendations for Other Services    Frequency Min 3X/week   Progress towards PT Goals Progress towards PT goals: Progressing toward goals  Plan Current plan remains appropriate    Precautions / Restrictions Precautions Precautions: Fall Precaution Comments: Had fall at home 1 month ago Restrictions Weight Bearing Restrictions: No   Pertinent Vitals/Pain Continues to have decreased O2 sats with ambulation on room air - to 87% today. Required 2 minutes to recover to 90% on room air.  After 5 minutes, was at 93%.    Mobility  Bed Mobility Bed Mobility: Supine to Sit;Sitting - Scoot to Edge of Bed;Sit to Supine Supine to Sit: 5: Supervision;HOB elevated Sitting - Scoot to Edge of Bed: 6: Modified independent (Device/Increase time);With rail Sit to Supine: 5: Supervision Details for Bed Mobility Assistance: Supervision for safety only. Transfers Transfers: Sit to Stand;Stand to Sit Sit to Stand: 5: Supervision;From bed Stand to Sit:  5: Supervision;To bed Details for Transfer Assistance: Supervision for safety only Ambulation/Gait Ambulation/Gait Assistance: 4: Min guard Ambulation Distance (Feet): 140 Feet Assistive device: None Ambulation/Gait Assistance Details: Min guard assist for safety and balance.  Patient ambulating without AD today and without O2.  Fatigued quickly.  Balance fairly good.  Continue to recommend use of RW/cane for safety at discharge. Gait Pattern: Step-through pattern;Decreased stride length Gait velocity: decreased General Gait Details: Patient O2 sat decreased to 87% with ambulation on room air.  Patient recovered to 90% in 2 minutes on room air.  Was at 93% withing 5 minutes of deep breathing on room air.      PT Goals (current goals can now be found in the care plan section)    Visit Information  Last PT Received On: 01/13/13 Assistance Needed: +1 History of Present Illness: Jackie Alvarez is a 79 y.o. female who presents to the ED after lab results this week revealed rapid worsening of her renal function  She had been seen in her PCP's office 3 days ago and had labs done which revealed an increase in her BUN and Cr to 35/3.5 and she was told to stop her Lotensin Rx, and her Lasix Rx.    She had a repeat check on Friday and the numbers were further elevated at  41/4.2  and she was referred to the ED for evalauation.     Subjective Data  Subjective: "My legs feel weak"  Cognition  Cognition Arousal/Alertness: Awake/alert Behavior During Therapy: WFL for tasks assessed/performed Overall Cognitive Status: Within Functional Limits for tasks assessed    Balance  Balance Balance Assessed: Yes High Level Balance High Level Balance Activites: Turns;Sudden stops;Head turns High Level Balance Comments: Noted decreased balance with high level balance activities without assistive device.  End of Session PT - End of Session Equipment Utilized During Treatment: Gait belt Activity Tolerance:  Patient limited by fatigue Patient left: in bed;with call bell/phone within reach Nurse Communication: Mobility status;Other (comment) (Drop in O2 sats during ambulation on room air)   GP     Despina Pole 01/13/2013, 4:52 PM Carita Pian. Sanjuana Kava, [place] Pager [phone]

## 2013-01-13 NOTE — Progress Notes (Signed)
Called RN, Jess, at 9:00am for patient to be kept NPO for renal artery duplex study which we would do at 2:00pm. Called RN at 2:00pm and patient has already eaten lunch.  Study will have to be completed in AM, 11/8. RN will put in order for patient to be NPO after midnight tonight.    Landry Mellow, RDMS, RVT 01/13/2013 2:02 PM

## 2013-01-13 NOTE — Progress Notes (Signed)
[facility] PROGRESS NOTE  Jackie Alvarez [id number] DOB: 09/19/1931 DOA: 01/06/2013 PCP: Maurice Small, D, MD  HPI: Jackie Alvarez is a 79 y.o. female who presents to the ED after lab results this week revealed rapid worsening of her renal function She had been seen in her PCP's office 3 days ago and had labs done which revealed an increase in her BUN and Cr to 35/3.5 and she was told to stop her Lotensin Rx, and her Lasix Rx. She had a repeat check on Friday and the numbers were further elevated at 41/4.2 and she was referred to the ED for evalauation. Her Creatinine 1 month ago had been 1.6. She denies having any vomiting or diarrhea, but she reports having a loss of appetite due to a "bad taste" in her mouth for the past week. She has had weakness and fatigue as well as malaise. She denies having any fevers or chills.   Assessment/Plan: Acute renal failure, possible due to lotensin, Lasix, baseline creatinine 1.6, may have had some AKI secondary to dehydration. Initial UA with hyaline casts. Creatinine rose again today to 3.2 - Appreciate nephrology assistance  - Renally dose medications  - Minimize nephrotoxins  - Renal duplex per nephrology pending today.  Acute hypoxic resp failure due to acute on chronic diastolic heart failure and possible COPD exac  Acute on chronic diastolic heart failure, BNP elevated and pulm edema on CXR  - 344m L despite increased lasix yesterday  - Continue lasix to 80mg  IV BID  Moderate right pleural effusion, likely due to acute diastolic CHF exacerbation and hydration in the setting of AKI  - Repeat CXR  - If persistent and not improving with diuresis, consider thoracentesis  - Will need close outpatient follow up by Dr. [name], pulmonology, particularly if not improving  Acute COPD exacerbation  - Fast taper since improving with diuresis  - Continue duonebs  - Levofloxacin day 4/5 for COPD exacerbation  - Continue Budesonide BID  Klebsiella UTI,   - Continue antibiotics day 4/5 of levofloxacin  Atrial fibrillation CHad2Vasc2 score= 5, rate still elevated to 140s, asymptomatic  - A/C per cardiology - up titrating Metoprolol to 50 BID - Appreciate cardiology assistance  Borderline hypotension, BP stable. continue to hold amlodipine and doxazosin  Hypothyroidism - continue levothyroxine 50 mcg every morning  - last TSH in 2010. Repeat here. Diabetes mellitus - HbA1c 5.7, FS high due to steroids, but tapering quickly.  - Continue SSI  Osteoporosis - hold calcium vitamin D for now  Cryptogenic anemia - workup in process by oncology-continue ferrous sulfate  Hyperlipidemia - Hold omega-3 fatty acids for now  Depression/sleep - continue mirtazapine 15 each bedtime    Diet: renal Fluids: none DVT Prophylaxis: on Coumadin  Code Status: Full Family Communication: none  Disposition Plan: inpatient, home when ready  Consultants:  Nephrology  Cardiology  Procedures:  none   Antibiotics  Anti-infectives   Start     Dose/Rate Route Frequency Ordered Stop   01/14/13 0800  levofloxacin (LEVAQUIN) IVPB 500 mg     500 mg 100 mL/hr over 60 Minutes Intravenous Every 48 hours 01/12/13 0850 01/16/13 0759   01/12/13 0800  levofloxacin (LEVAQUIN) IVPB 500 mg  Status:  Discontinued     500 mg 100 mL/hr over 60 Minutes Intravenous Every 48 hours 01/10/13 0750 01/12/13 0850   01/10/13 0800  levofloxacin (LEVAQUIN) IVPB 750 mg     750 mg 100 mL/hr over 90 Minutes Intravenous  Once  01/10/13 0750 01/10/13 1440     Antibiotics Given (last 72 hours)   Date/Time Action Medication Dose Rate   01/10/13 1310 Given  [no IV access]   levofloxacin (LEVAQUIN) IVPB 750 mg 750 mg 100 mL/hr   01/12/13 0726 Given   levofloxacin (LEVAQUIN) IVPB 500 mg 500 mg 100 mL/hr      HPI/Subjective: - feeling well overall, still SOB with walking around, asking when she can go home  Objective: Filed Vitals:   01/12/13 1423 01/12/13 2107 01/12/13 2156  01/13/13 0546  BP: 121/65 126/64  142/75  Pulse: 114 76  59  Temp: 97.8 F (36.6 C) 97.5 F (36.4 C)    TempSrc: Oral Oral    Resp: 17 18  18   Height:      Weight:    89.721 kg (197 lb 12.8 oz)  SpO2: 94% 94% 94% 97%    Intake/Output Summary (Last 24 hours) at 01/13/13 0721 Last data filed at 01/12/13 1912  Gross per 24 hour  Intake    780 ml  Output   1650 ml  Net   -870 ml   Filed Weights   01/11/13 0519 01/12/13 0856 01/13/13 0546  Weight: 90.719 kg (200 lb) 91.2 kg (201 lb 1 oz) 89.721 kg (197 lb 12.8 oz)    Exam:   General:  NAD  Cardiovascular: irregular, tachycardic  Respiratory: decreased on right lower field, no wheezing  Abdomen: soft, not tender to palpation, positive bowel sounds  MSK: trace edema  Neuro: non focal  Data Reviewed: Basic Metabolic Panel:  Recent Labs Lab 01/07/13 0405 01/08/13 0555 01/09/13 0615 01/11/13 0452 01/12/13 0535 01/13/13 0530  NA 141 141 140 142 141 142  K 3.9 3.9 3.7 5.0 4.4 4.2  CL 102 105 107 105 103 102  CO2 26 24 22 25 25 27   GLUCOSE 104* 111* 101* 242* 181* 119*  BUN 45* 39* 34* 33* 41* 56*  CREATININE 4.16* 3.77* 3.11* 2.67* 3.00* 3.20*  CALCIUM 10.0 9.1 8.7 9.0 9.2 9.4  PHOS  --  4.0  --   --  4.1  --    Liver Function Tests:  Recent Labs Lab 01/06/13 2231 01/08/13 0555 01/12/13 0535  AST 21  --   --   ALT 14  --   --   ALKPHOS 51  --   --   BILITOT 0.3  --   --   PROT 6.5  --   --   ALBUMIN 3.5 3.0* 3.3*   CBC:  Recent Labs Lab 01/06/13 2231 01/07/13 0405 01/08/13 0555 01/10/13 0605 01/11/13 0452 01/12/13 0535  WBC 4.2 4.0 4.2 5.6 3.8* 8.7  NEUTROABS 2.8  --   --   --   --   --   HGB 9.5* 8.9* 9.2* 9.3* 10.0* 10.2*  HCT 29.1* 26.6* 27.4* 28.4* 30.1* 31.0*  MCV 95.4 95.0 94.5 96.9 95.6 95.4  PLT 153 139* 154 150 164 198   BNP (last 3 results)  Recent Labs  01/10/13 0605  PROBNP 7146.0*   CBG:  Recent Labs Lab 01/12/13 0640 01/12/13 1110 01/12/13 1554 01/12/13 2102  01/13/13 0557  GLUCAP 160* 180* 323* 118* 115*    Recent Results (from the past 240 hour(s))  URINE CULTURE     Status: None   Collection Time    01/07/13  6:26 AM      Result Value Range Status   Specimen Description URINE, CLEAN CATCH   Final   Special Requests NONE  Final   Culture  Setup Time     Final   Value: 01/07/2013 19:19     Performed at [facility]     Final   Value: >=100,000 COLONIES/ML     Performed at [facility]   Culture     Final   Value: KLEBSIELLA PNEUMONIAE     Performed at [facility]   Report Status 01/09/2013 FINAL   Final   Organism ID, Bacteria KLEBSIELLA PNEUMONIAE   Final     Studies: Dg Chest Port 1 View  01/12/2013   CLINICAL DATA:  53-year- female with nonproductive cough and pleural effusion. Initial encounter.  EXAM: PORTABLE CHEST - 1 VIEW  COMPARISON:  01/10/2013 and earlier.  FINDINGS: Portable AP upright view at 1017 hr. Small to moderate right pleural effusion probably not significantly changed allowing for differences in patient positioning. Continued increased interstitial opacity. No pneumothorax. Persistent left retrocardiac opacity but no definite left effusion. Stable postoperative changes to the left axilla. Stable cardiac size and mediastinal contours.  IMPRESSION: Small to moderate right pleural effusion not significantly changed allowing for different patient positioning. Right greater than left lower lobe collapse or consolidation. Continued vascular congestion.   Electronically Signed   By: Lars Pinks M.D.   On: 01/12/2013 17:36    Scheduled Meds: . ipratropium  0.5 mg Nebulization BID   And  . albuterol  2.5 mg Nebulization BID  . budesonide (PULMICORT) nebulizer solution  0.5 mg Nebulization BID  . enoxaparin (LOVENOX) injection  90 mg Subcutaneous Q24H  . ferrous sulfate  325 mg Oral TID WC  . furosemide  80 mg Oral BID  . insulin aspart  0-5 Units Subcutaneous QHS  . insulin aspart   0-9 Units Subcutaneous TID WC  . insulin aspart  4 Units Subcutaneous TID WC  . [START ON 01/14/2013] levofloxacin (LEVAQUIN) IV  500 mg Intravenous Q48H  . levothyroxine  50 mcg Oral QAC breakfast  . metoprolol tartrate  50 mg Oral BID  . mirtazapine  15 mg Oral Daily  . omega-3 acid ethyl esters  1 g Oral BID  . predniSONE  40 mg Oral Q breakfast  . Warfarin - Pharmacist Dosing Inpatient   Does not apply q1800   Continuous Infusions:  Principal Problem:   Acute on chronic renal failure Active Problems:   Peripheral vascular disease, unspecified   COPD (chronic obstructive pulmonary disease) with emphysema   Thyroid disease   Anemia   HTN (hypertension)   Chronic diastolic heart failure   Acute respiratory failure with hypoxia   COPD with acute exacerbation   Acute on chronic diastolic heart failure   Chronic atrial fibrillation  Time spent: [place], MD [facility] Pager [phone]. If 7 PM - 7 AM, please contact night-coverage at [email or web address], password [facility] 01/13/2013, 7:21 AM  LOS: 7 days

## 2013-01-14 DIAGNOSIS — N179 Acute kidney failure, unspecified: Secondary | ICD-10-CM

## 2013-01-14 LAB — PROTIME-INR
INR: 2.74 — ABNORMAL HIGH (ref 0.00–1.49)
Prothrombin Time: 28.1 seconds — ABNORMAL HIGH (ref 11.6–15.2)

## 2013-01-14 LAB — GLUCOSE, CAPILLARY
Glucose-Capillary: 259 mg/dL — ABNORMAL HIGH (ref 70–99)
Glucose-Capillary: 291 mg/dL — ABNORMAL HIGH (ref 70–99)
Glucose-Capillary: 145 mg/dL — ABNORMAL HIGH (ref 70–99)

## 2013-01-14 LAB — BASIC METABOLIC PANEL
BUN: 68 mg/dL — ABNORMAL HIGH (ref 6–23)
CO2: 29 mEq/L (ref 19–32)
Calcium: 9.4 mg/dL (ref 8.4–10.5)
Creatinine, Ser: 3.06 mg/dL — ABNORMAL HIGH (ref 0.50–1.10)
GFR calc non Af Amer: 13 mL/min — ABNORMAL LOW (ref >90)
Glucose, Bld: 144 mg/dL — ABNORMAL HIGH (ref 70–99)

## 2013-01-14 NOTE — Progress Notes (Signed)
Pt has not had BM since 01/10/13, she asked for prune juice and said if that doesn't work she will try medication

## 2013-01-14 NOTE — Progress Notes (Signed)
[facility] PROGRESS NOTE  [name] [id number] DOB: 11-01-1931 DOA: 01/06/2013 PCP: Jackie Alvarez, D, MD  HPI: Jackie Alvarez is a 79 y.o. female who presents to the ED after lab results this week revealed rapid worsening of her renal function She had been seen in her PCP's office 3 days ago and had labs done which revealed an increase in her BUN and Cr to 35/3.5 and she was told to stop her Lotensin Rx, and her Lasix Rx. She had a repeat check on Friday and the numbers were further elevated at 41/4.2 and she was referred to the ED for evalauation. Her Creatinine 1 month ago had been 1.6. She denies having any vomiting or diarrhea, but she reports having a loss of appetite due to a "bad taste" in her mouth for the past week. She has had weakness and fatigue as well as malaise. She denies having any fevers or chills.   Assessment/Plan: Acute renal failure, possible due to lotensin, Lasix, baseline creatinine 1.6, may have had some AKI secondary to dehydration. Initial UA with hyaline casts. Creatinine improved this morning, good UOP - Appreciate nephrology assistance  - Renally dose medications  - Minimize nephrotoxins  - Renal duplex per nephrology pending .  Acute hypoxic resp failure due to acute on chronic diastolic heart failure and possible COPD exac  Acute on chronic diastolic heart failure, BNP elevated and pulm edema on CXR  - 339m L despite increased lasix yesterday  - Continue lasix to 80mg  IV BID  - good UOP overnight, on room air this morning.  Moderate right pleural effusion, likely due to acute diastolic CHF exacerbation and hydration in the setting of AKI  - Repeat CXR  - If persistent and not improving with diuresis, consider thoracentesis  - Will need close outpatient follow up by Dr. Gwenette Alvarez, pulmonology, particularly if not improving  Acute COPD exacerbation  - Fast taper since improving with diuresis  - Continue duonebs  - Levofloxacin day 5/5 for COPD  exacerbation  - Continue Budesonide BID  Klebsiella UTI,  - Continue antibiotics day 5/5 of levofloxacin  Atrial fibrillation CHad2Vasc2 score= 5, rate better, asymptomatic  - A/C per cardiology - up titrating Metoprolol to 50 BID - Appreciate cardiology assistance  Borderline hypotension, BP stable. continue to hold amlodipine and doxazosin  Hypothyroidism - continue levothyroxine 50 mcg every morning  - last TSH in 2010. Repeat here. Diabetes mellitus - HbA1c 5.7, FS high due to steroids, but tapering quickly.  - Continue SSI  Osteoporosis - hold calcium vitamin D for now  Cryptogenic anemia - workup in process by oncology-continue ferrous sulfate  Hyperlipidemia - Hold omega-3 fatty acids for now  Depression/sleep - continue mirtazapine 15 each bedtime    Diet: renal Fluids: none DVT Prophylaxis: on Coumadin  Code Status: Full Family Communication: none  Disposition Plan: inpatient, home when ready  Consultants:  Nephrology  Cardiology  Procedures:  none   Antibiotics  Anti-infectives   Start     Dose/Rate Route Frequency Ordered Stop   01/14/13 0800  levofloxacin (LEVAQUIN) IVPB 500 mg  Status:  Discontinued     500 mg 100 mL/hr over 60 Minutes Intravenous Every 48 hours 01/12/13 0850 01/13/13 1019   01/14/13 0800  levofloxacin (LEVAQUIN) tablet 500 mg     500 mg Oral Every 48 hours 01/13/13 1019 01/16/13 0759   01/12/13 0800  levofloxacin (LEVAQUIN) IVPB 500 mg  Status:  Discontinued     500 mg 100 mL/hr  over 60 Minutes Intravenous Every 48 hours 01/10/13 0750 01/12/13 0850   01/10/13 0800  levofloxacin (LEVAQUIN) IVPB 750 mg     750 mg 100 mL/hr over 90 Minutes Intravenous  Once 01/10/13 0750 01/10/13 1440     Antibiotics Given (last 72 hours)   Date/Time Action Medication Dose Rate   01/12/13 0726 Given   levofloxacin (LEVAQUIN) IVPB 500 mg 500 mg 100 mL/hr      HPI/Subjective: - breathing better this morning.   Objective: Filed Vitals:    01/13/13 1449 01/13/13 1453 01/13/13 2115 01/14/13 0556  BP:   135/58 149/83  Pulse:   119 96  Temp:   97.4 F (36.3 C) 97.6 F (36.4 C)  TempSrc:   Oral Oral  Resp:   18 18  Height:      Weight:    86.592 kg (190 lb 14.4 oz)  SpO2: 87% 93% 90% 91%    Intake/Output Summary (Last 24 hours) at 01/14/13 0716 Last data filed at 01/14/13 0600  Gross per 24 hour  Intake    858 ml  Output   5500 ml  Net  -4642 ml   Filed Weights   01/12/13 0856 01/13/13 0546 01/14/13 0556  Weight: 91.2 kg (201 lb 1 oz) 89.721 kg (197 lb 12.8 oz) 86.592 kg (190 lb 14.4 oz)    Exam:   General:  NAD  Cardiovascular: irregular, tachycardic  Respiratory: decreased on right lower field, no wheezing  Abdomen: soft, not tender to palpation, positive bowel sounds  MSK: trace edema  Neuro: non focal  Data Reviewed: Basic Metabolic Panel:  Recent Labs Lab 01/08/13 0555 01/09/13 0615 01/11/13 0452 01/12/13 0535 01/13/13 0530 01/14/13 0429  NA 141 140 142 141 142 140  K 3.9 3.7 5.0 4.4 4.2 4.0  CL 105 107 105 103 102 96  CO2 24 22 25 25 27 29   GLUCOSE 111* 101* 242* 181* 119* 144*  BUN 39* 34* 33* 41* 56* 68*  CREATININE 3.77* 3.11* 2.67* 3.00* 3.20* 3.06*  CALCIUM 9.1 8.7 9.0 9.2 9.4 9.4  PHOS 4.0  --   --  4.1  --   --    Liver Function Tests:  Recent Labs Lab 01/08/13 0555 01/12/13 0535  ALBUMIN 3.0* 3.3*   CBC:  Recent Labs Lab 01/08/13 0555 01/10/13 0605 01/11/13 0452 01/12/13 0535  WBC 4.2 5.6 3.8* 8.7  HGB 9.2* 9.3* 10.0* 10.2*  HCT 27.4* 28.4* 30.1* 31.0*  MCV 94.5 96.9 95.6 95.4  PLT 154 150 164 198   BNP (last 3 results)  Recent Labs  01/10/13 0605  PROBNP 7146.0*   CBG:  Recent Labs Lab 01/13/13 0557 01/13/13 1113 01/13/13 1627 01/13/13 2141 01/14/13 0552  GLUCAP 115* 173* 356* 127* 130*    Recent Results (from the past 240 hour(s))  URINE CULTURE     Status: None   Collection Time    01/07/13  6:26 AM      Result Value Range Status    Specimen Description URINE, CLEAN CATCH   Final   Special Requests NONE   Final   Culture  Setup Time     Final   Value: 01/07/2013 19:19     Performed at [place]     Final   Value: >=100,000 COLONIES/ML     Performed at [facility]   Culture     Final   Value: KLEBSIELLA PNEUMONIAE     Performed at [facility]  Report Status 01/09/2013 FINAL   Final   Organism ID, Bacteria KLEBSIELLA PNEUMONIAE   Final     Studies: US Renal  01/13/2013   CLINICAL DATA:  Acute renal failure.  EXAM: RENAL/URINARY TRACT ULTRASOUND COMPLETE  COMPARISON:  CT 03/29/2008.  FINDINGS: Right Kidney  Length: 10.4. Tiny cortical echogenicity in the right upper renal pole may represent a parenchymal calcification or cysts with echogenic debris. 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  Left Kidney  Length: 10.7 cm long axis. Suboptimal visualization. No gross hydronephrosis.  Bladder  Appears normal for degree of bladder distention.  Left pleural effusion noted.  IMPRESSION: Negative for hydronephrosis or stone disease. Tiny echogenic focus in the right upper renal pole cortex probably represents a Alvarez echogenic cyst or parenchymal calcification.   Electronically Signed   By: Dereck Ligas M.D.   On: 01/13/2013 16:00   Dg Chest Port 1 View  01/12/2013   CLINICAL DATA:  26-year- female with nonproductive cough and pleural effusion. Initial encounter.  EXAM: PORTABLE CHEST - 1 VIEW  COMPARISON:  01/10/2013 and earlier.  FINDINGS: Portable AP upright view at 1017 hr. Alvarez to moderate right pleural effusion probably not significantly changed allowing for differences in patient positioning. Continued increased interstitial opacity. No pneumothorax. Persistent left retrocardiac opacity but no definite left effusion. Stable postoperative changes to the left axilla. Stable cardiac size and mediastinal contours.  IMPRESSION: Alvarez to moderate right  pleural effusion not significantly changed allowing for different patient positioning. Right greater than left lower lobe collapse or consolidation. Continued vascular congestion.   Electronically Signed   By: Lars Pinks M.D.   On: 01/12/2013 17:36    Scheduled Meds: . ipratropium  0.5 mg Nebulization BID   And  . albuterol  2.5 mg Nebulization BID  . budesonide (PULMICORT) nebulizer solution  0.5 mg Nebulization BID  . enoxaparin (LOVENOX) injection  90 mg Subcutaneous Q24H  . ferrous sulfate  325 mg Oral TID WC  . furosemide  80 mg Oral BID  . insulin aspart  0-5 Units Subcutaneous QHS  . insulin aspart  0-9 Units Subcutaneous TID WC  . insulin aspart  4 Units Subcutaneous TID WC  . levofloxacin  500 mg Oral Q48H  . levothyroxine  50 mcg Oral QAC breakfast  . metoprolol tartrate  100 mg Oral BID  . mirtazapine  15 mg Oral Daily  . omega-3 acid ethyl esters  1 g Oral BID  . predniSONE  40 mg Oral Q breakfast  . Warfarin - Pharmacist Dosing Inpatient   Does not apply q1800   Continuous Infusions:  Principal Problem:   Acute on chronic renal failure Active Problems:   Peripheral vascular disease, unspecified   COPD (chronic obstructive pulmonary disease) with emphysema   Thyroid disease   Anemia   HTN (hypertension)   Chronic diastolic heart failure   Acute respiratory failure with hypoxia   COPD with acute exacerbation   Acute on chronic diastolic heart failure   Chronic atrial fibrillation  Time spent: [place], MD [facility] Pager [phone]. If 7 PM - 7 AM, please contact night-coverage at [email or web address], password [facility] 01/14/2013, 7:16 AM  LOS: 8 days

## 2013-01-14 NOTE — Progress Notes (Signed)
S: Feels better  O:BP 149/83  Pulse 96  Temp(Src) 97.6 F (36.4 C) (Oral)  Resp 18  Ht 5\' 7"  (1.702 m)  Wt 86.592 kg (190 lb 14.4 oz)  BMI 29.89 kg/m2  SpO2 93%  Intake/Output Summary (Last 24 hours) at 01/14/13 1005 Last data filed at 01/14/13 0600  Gross per 24 hour  Intake    618 ml  Output   3500 ml  Net  -2882 ml   Weight change: -4.608 kg (-10 lb 2.6 oz) [id number] and alert [id number], irreg Resp:decreased BS rt base Abd:+ BS NTND Ext:tr edema NEURO:CNI Ox3 no asterixis   . ipratropium  0.5 mg Nebulization BID   And  . albuterol  2.5 mg Nebulization BID  . budesonide (PULMICORT) nebulizer solution  0.5 mg Nebulization BID  . enoxaparin (LOVENOX) injection  90 mg Subcutaneous Q24H  . ferrous sulfate  325 mg Oral TID WC  . furosemide  80 mg Oral BID  . insulin aspart  0-5 Units Subcutaneous QHS  . insulin aspart  0-9 Units Subcutaneous TID WC  . insulin aspart  4 Units Subcutaneous TID WC  . levothyroxine  50 mcg Oral QAC breakfast  . metoprolol tartrate  100 mg Oral BID  . mirtazapine  15 mg Oral Daily  . omega-3 acid ethyl esters  1 g Oral BID  . predniSONE  40 mg Oral Q breakfast  . Warfarin - Pharmacist Dosing Inpatient   Does not apply q1800   US Renal  01/13/2013   CLINICAL DATA:  Acute renal failure.  EXAM: RENAL/URINARY TRACT ULTRASOUND COMPLETE  COMPARISON:  CT 03/29/2008.  FINDINGS: Right Kidney  Length: 10.4. Tiny cortical echogenicity in the right upper renal pole may represent a parenchymal calcification or cysts with echogenic debris. 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  Left Kidney  Length: 10.7 cm long axis. Suboptimal visualization. No gross hydronephrosis.  Bladder  Appears normal for degree of bladder distention.  Left pleural effusion noted.  IMPRESSION: Negative for hydronephrosis or stone disease. Tiny echogenic focus in the right upper renal pole cortex probably represents a small echogenic cyst or parenchymal  calcification.   Electronically Signed   By: Dereck Ligas M.D.   On: 01/13/2013 16:00   Dg Chest Port 1 View  01/12/2013   CLINICAL DATA:  15-year- female with nonproductive cough and pleural effusion. Initial encounter.  EXAM: PORTABLE CHEST - 1 VIEW  COMPARISON:  01/10/2013 and earlier.  FINDINGS: Portable AP upright view at 1017 hr. Small to moderate right pleural effusion probably not significantly changed allowing for differences in patient positioning. Continued increased interstitial opacity. No pneumothorax. Persistent left retrocardiac opacity but no definite left effusion. Stable postoperative changes to the left axilla. Stable cardiac size and mediastinal contours.  IMPRESSION: Small to moderate right pleural effusion not significantly changed allowing for different patient positioning. Right greater than left lower lobe collapse or consolidation. Continued vascular congestion.   Electronically Signed   By: Lars Pinks M.D.   On: 01/12/2013 17:36   BMET    Component Value Date/Time   NA 140 01/14/2013 0429   NA 149* 11/14/2012 1206   K 4.0 01/14/2013 0429   K 3.6 11/14/2012 1206   CL 96 01/14/2013 0429   CO2 29 01/14/2013 0429   CO2 30* 11/14/2012 1206   GLUCOSE 144* 01/14/2013 0429   GLUCOSE 134 11/14/2012 1206   BUN 68* 01/14/2013 0429   BUN 30.3* 11/14/2012 1206   CREATININE  3.06* 01/14/2013 0429   CREATININE 1.7* 11/14/2012 1206   CALCIUM 9.4 01/14/2013 0429   CALCIUM 10.1 11/14/2012 1206   GFRNONAA 13* 01/14/2013 0429   GFRAA 16* 01/14/2013 0429   CBC    Component Value Date/Time   WBC 8.7 01/12/2013 0535   WBC 4.5 11/14/2012 1206   RBC 3.25* 01/12/2013 0535   RBC 2.98* 11/14/2012 1206   HGB 10.2* 01/12/2013 0535   HGB 9.3* 11/14/2012 1206   HCT 31.0* 01/12/2013 0535   HCT 28.6* 11/14/2012 1206   PLT 198 01/12/2013 0535   PLT 121* 11/14/2012 1206   MCV 95.4 01/12/2013 0535   MCV 96.0 11/14/2012 1206   MCH 31.4 01/12/2013 0535   MCH 31.2 11/14/2012 1206   MCHC 32.9 01/12/2013 0535   MCHC 32.5 11/14/2012  1206   RDW 14.6 01/12/2013 0535   RDW 15.0* 11/14/2012 1206   LYMPHSABS 1.0 01/06/2013 2231   LYMPHSABS 1.0 11/14/2012 1206   MONOABS 0.3 01/06/2013 2231   MONOABS 0.2 11/14/2012 1206   EOSABS 0.1 01/06/2013 2231   EOSABS 0.2 11/14/2012 1206   BASOSABS 0.0 01/06/2013 2231   BASOSABS 0.0 11/14/2012 1206     Assessment: 1.  Acute on CKD 3 in setting of poor po intake and ACE.  Scr trending down with excellent UO 2. Klebsiella UTI 3. DM 4. HTN  Had renal doppler done this AM 5. Rt pleural effusion 6. Anemia and iron def  Plan:  1. Cont curent lasix dose 2. Rec  Quickly taper off prednisone 3. Home soon if renal fx cont to improve 4. Recheck labs in AM  Lyvonne Cassell T

## 2013-01-14 NOTE — Progress Notes (Signed)
PT Cancellation Note  Patient Details Name: Jackie Alvarez MRN: [id number] DOB: 1931/07/22   Cancelled Treatment:    Reason Eval/Treat Not Completed: Fatigue/lethargy limiting ability to participate.  Patient reports she has been up since 4:30 am for testing.  Patient declines PT now - requests we return tomorrow.   Despina Pole 01/14/2013, 5:04 PM Carita Pian. Sanjuana Kava, [place] Pager [phone]

## 2013-01-14 NOTE — Progress Notes (Signed)
[facility] NOTE   Pharmacy Consult for :  Coumadin with Lovenox bridging ; Levaquin Indication: atrial fibrillation ; COPD exacerbation and Kleibsiella UTI  Dosing weight 87 kg  Labs:  Recent Labs  01/12/13 0535 01/13/13 0530 01/14/13 0429  HGB 10.2*  --   --   HCT 31.0*  --   --   PLT 198  --   --   INR 1.52* 1.74* 2.74*  CREATININE 3.00* 3.20* 3.06*   Lab Results  Component Value Date   INR 2.74* 01/14/2013   INR 1.74* 01/13/2013   INR 1.52* 01/12/2013   Estimated Creatinine Clearance: 16.9 ml/min (by C-G formula based on Cr of 3.06).  Medications:  Scheduled:  . ipratropium  0.5 mg Nebulization BID   And  . albuterol  2.5 mg Nebulization BID  . budesonide (PULMICORT) nebulizer solution  0.5 mg Nebulization BID  . enoxaparin (LOVENOX) injection  90 mg Subcutaneous Q24H  . ferrous sulfate  325 mg Oral TID WC  . furosemide  80 mg Oral BID  . insulin aspart  0-5 Units Subcutaneous QHS  . insulin aspart  0-9 Units Subcutaneous TID WC  . insulin aspart  4 Units Subcutaneous TID WC  . levothyroxine  50 mcg Oral QAC breakfast  . metoprolol tartrate  100 mg Oral BID  . mirtazapine  15 mg Oral Daily  . omega-3 acid ethyl esters  1 g Oral BID  . predniSONE  40 mg Oral Q breakfast  . Warfarin - Pharmacist Dosing Inpatient   Does not apply q1800   Anti-infectives   Start     Dose/Rate Route Frequency Ordered Stop   01/14/13 0800  levofloxacin (LEVAQUIN) IVPB 500 mg  Status:  Discontinued     500 mg 100 mL/hr over 60 Minutes Intravenous Every 48 hours 01/12/13 0850 01/13/13 1019   01/14/13 0800  levofloxacin (LEVAQUIN) tablet 500 mg     500 mg Oral Every 48 hours 01/13/13 1019 01/14/13 0908   01/12/13 0800  levofloxacin (LEVAQUIN) IVPB 500 mg  Status:  Discontinued     500 mg 100 mL/hr over 60 Minutes Intravenous Every 48 hours 01/10/13 0750 01/12/13 0850   01/10/13 0800  levofloxacin (LEVAQUIN) IVPB 750 mg     750 mg 100 mL/hr over 90 Minutes Intravenous  Once  01/10/13 0750 01/10/13 1440      Assessment:  79 y/o Female on Coumadin with Lovenox bridging for atrial fibrillation.  She has been on Levaquin x 5 days for [facility] UTI.  Last dose of Levaquin given this morning.  Patient Afebrile, WBC 8.7.  Large increase of INR 1.74 > 2.74.  No bleeding complications noted.  Large jump in INR likely due to additive effect of Levaquin.  No additional Levaquin scheduled.  Goal of Therapy:   INR 2-3   Plan:  Discontinue Lovenox after today's scheduled dose. No Coumadin today. No additional Levaquin scheduled. Daily INR's, CBC.  Monitor for bleeding complications.   Braelon Sprung, Craig Guess, Pharm.D. 01/14/2013, 11:16 AM

## 2013-01-14 NOTE — Progress Notes (Signed)
Bilateral renal artery duplex completed.  No obvious evidence of significant renal artery stenosis.

## 2013-01-14 NOTE — Progress Notes (Signed)
       Patient Name: Jackie Alvarez Date of Encounter: 01/14/2013    SUBJECTIVE:  79 yo with hx of rapid AF and diastolic dysfunction.  Slowly improving.  Significant diuresis yesterday.  Creatinine has remained stable.   TELEMETRY:  Atrial fibrillation with average rate in the 100-110 range. Some heart rates as high as 140 beats per minute with activity   Filed Vitals:   01/13/13 1453 01/13/13 2115 01/14/13 0556 01/14/13 0738  BP:  135/58 149/83   Pulse:  119 96   Temp:  97.4 F (36.3 C) 97.6 F (36.4 C)   TempSrc:  Oral Oral   Resp:  18 18   Height:      Weight:   190 lb 14.4 oz (86.592 kg)   SpO2: 93% 90% 91% 93%    Intake/Output Summary (Last 24 hours) at 01/14/13 1125 Last data filed at 01/14/13 1051  Gross per 24 hour  Intake    818 ml  Output   3500 ml  Net  -2682 ml      LABS: Basic Metabolic Panel:  Recent Labs  01/12/13 0535 01/13/13 0530 01/14/13 0429  NA 141 142 140  K 4.4 4.2 4.0  CL 103 102 96  CO2 25 27 29   GLUCOSE 181* 119* 144*  BUN 41* 56* 68*  CREATININE 3.00* 3.20* 3.06*  CALCIUM 9.2 9.4 9.4  PHOS 4.1  --   --    CBC:  Recent Labs  01/12/13 0535  WBC 8.7  HGB 10.2*  HCT 31.0*  MCV 95.4  PLT 198   Radiology/Studies:  No new data  Physical Exam: Blood pressure 149/83, pulse 96, temperature 97.6 F (36.4 C), temperature source Oral, resp. rate 18, height 5\' 7"  (1.702 m), weight 190 lb 14.4 oz (86.592 kg), SpO2 93.00%. Weight change: -10 lb 2.6 oz (-4.608 kg)   Decreased breath sounds at the right base Irregularly irregular rhythm, rate is well controlled. Lower extremity trace edema  ASSESSMENT:  1. Chronic atrial fibrillation with relatively poor rate control - HR has improved on metoprolol  2. Acute on chronic kidney disease, stage III-IV 3. Overall volume overload aggravating diastolic heart failure and causing an acute on chronic picture.  THis is improved.  She appears euvolemic and is lying flat without  difficulty. 4. COPD   Ramond Dial., MD, [facility] 01/14/2013, 11:39 AM Office - [phone] Pager [phone]

## 2013-01-15 LAB — PROTIME-INR
INR: 3.25 — ABNORMAL HIGH (ref 0.00–1.49)
Prothrombin Time: 32 seconds — ABNORMAL HIGH (ref 11.6–15.2)

## 2013-01-15 LAB — GLUCOSE, CAPILLARY
Glucose-Capillary: 155 mg/dL — ABNORMAL HIGH (ref 70–99)
Glucose-Capillary: 372 mg/dL — ABNORMAL HIGH (ref 70–99)
Glucose-Capillary: 142 mg/dL — ABNORMAL HIGH (ref 70–99)

## 2013-01-15 LAB — BASIC METABOLIC PANEL
Calcium: 9.9 mg/dL (ref 8.4–10.5)
GFR calc Af Amer: 16 mL/min — ABNORMAL LOW (ref >90)
GFR calc non Af Amer: 14 mL/min — ABNORMAL LOW (ref >90)
Potassium: 3.7 mEq/L (ref 3.5–5.1)
Sodium: 141 mEq/L (ref 135–145)

## 2013-01-15 MED ORDER — FUROSEMIDE 40 MG PO TABS
40.0000 mg | ORAL_TABLET | Freq: Two times a day (BID) | ORAL | Status: DC
  Administered 2013-01-15 – 2013-01-18 (×7): 40 mg via ORAL
  Filled 2013-01-15 (×10): qty 1

## 2013-01-15 MED ORDER — PREDNISONE 20 MG PO TABS
20.0000 mg | ORAL_TABLET | Freq: Every day | ORAL | Status: DC
  Administered 2013-01-16 – 2013-01-18 (×3): 20 mg via ORAL
  Filled 2013-01-15 (×4): qty 1

## 2013-01-15 NOTE — Progress Notes (Signed)
Physical Therapy Treatment Patient Details Name: Jackie Alvarez MRN: [id number] DOB: 02-25-1932 Today's Date: 01/15/2013 Time: 1006-1019 PT Time Calculation (min): 13 min  PT Assessment / Plan / Recommendation  History of Present Illness Jackie Alvarez is a 79 y.o. female who presents to the ED after lab results this week revealed rapid worsening of her renal function  She had been seen in her PCP's office 3 days ago and had labs done which revealed an increase in her BUN and Cr to 35/3.5 and she was told to stop her Lotensin Rx, and her Lasix Rx.    She had a repeat check on Friday and the numbers were further elevated at  41/4.2  and she was referred to the ED for evalauation.    PT Comments   Pt progressing well with therapy. Was able to increase ambulation distance today on RA; O2 stats at 94%. Pt at supervision level for mobility and demo increased stability when amb with RW. Will cont to follow per POC till acute D/C.   Follow Up Recommendations  Home health PT     Does the patient have the potential to tolerate intense rehabilitation     Barriers to Discharge        Equipment Recommendations  None recommended by PT    Recommendations for Other Services OT consult  Frequency Min 3X/week   Progress towards PT Goals Progress towards PT goals: Progressing toward goals  Plan Current plan remains appropriate    Precautions / Restrictions Precautions Precautions: Fall Precaution Comments: Had fall at home 1 month ago Restrictions Weight Bearing Restrictions: No   Pertinent Vitals/Pain No c/o pain; see impression for O2 stats    Mobility  Bed Mobility Bed Mobility: Supine to Sit;Sitting - Scoot to Edge of Bed Supine to Sit: HOB elevated;6: Modified independent (Device/Increase time);With rails Sitting - Scoot to Edge of Bed: 6: Modified independent (Device/Increase time);With rail Details for Bed Mobility Assistance: requires increased time to come to EOB; relies heavily on  handrails; now physical (A) needed  Transfers Transfers: Sit to Stand;Stand to Sit Sit to Stand: 5: Supervision;From bed Stand to Sit: 5: Supervision;To chair/3-in-1;With armrests Details for Transfer Assistance: supervision for cues for hand placment and safety  Ambulation/Gait Ambulation/Gait Assistance: 4: Min guard Ambulation Distance (Feet): 150 Feet Assistive device: Rolling walker Ambulation/Gait Assistance Details: pt stated she felt more comfortable amb with RW; was able to amb on RA O2 stat at 94%; cues for upright posture and safety with RW  Gait Pattern: Step-through pattern;Decreased stride length General Gait Details: pt required 1 standing rest break due to fatigue  Stairs: No Wheelchair Mobility Wheelchair Mobility: No    Exercises General Exercises - Lower Extremity Ankle Circles/Pumps: AROM;10 reps;Both;Supine   PT Diagnosis:    PT Problem List:   PT Treatment Interventions:     PT Goals (current goals can now be found in the care plan section) Acute Rehab PT Goals Patient Stated Goal: to go home soon PT Goal Formulation: With patient Time For Goal Achievement: 01/15/13 Potential to Achieve Goals: Good  Visit Information  Last PT Received On: 01/15/13 Assistance Needed: +1 History of Present Illness: Jackie Alvarez is a 79 y.o. female who presents to the ED after lab results this week revealed rapid worsening of her renal function  She had been seen in her PCP's office 3 days ago and had labs done which revealed an increase in her BUN and Cr to 35/3.5 and she was told  to stop her Lotensin Rx, and her Lasix Rx.    She had a repeat check on Friday and the numbers were further elevated at  41/4.2  and she was referred to the ED for evalauation.     Subjective Data  Subjective: " lets walk and see how i feel"  Patient Stated Goal: to go home soon   Cognition  Cognition Arousal/Alertness: Awake/alert Behavior During Therapy: WFL for tasks  assessed/performed Overall Cognitive Status: Within Functional Limits for tasks assessed    Balance  Balance Balance Assessed: No High Level Balance High Level Balance Activites: Turns;Head turns;Sudden stops High Level Balance Comments: no LOB; pt would slow down when challenged dynamically   End of Session PT - End of Session Equipment Utilized During Treatment: Gait belt Activity Tolerance: Patient tolerated treatment well Patient left: in chair;with call bell/phone within reach;with nursing/sitter in room Nurse Communication: Mobility status;Other (comment)   GP     Gustavus Bryant, [place] 01/15/2013, 12:13 PM

## 2013-01-15 NOTE — Progress Notes (Addendum)
    Patient Name: Jackie Alvarez Date of Encounter: 01/15/2013    SUBJECTIVE:  79 yo with hx of rapid AF and diastolic dysfunction.  Slowly improving.  Significant diuresis yesterday.  Creatinine has remained stable.   TELEMETRY:  Atrial fibrillation with average rate in the 100-110 range. Some heart rates as high as 140 beats per minute with activity   Filed Vitals:   01/14/13 2013 01/15/13 0502 01/15/13 0924 01/15/13 1015  BP: 135/92 138/71  149/66  Pulse: 59 73  92  Temp: 98.1 F (36.7 C) 97.9 F (36.6 C)  97.6 F (36.4 C)  TempSrc: Oral Oral  Oral  Resp: 20 20    Height:      Weight:  183 lb 6.8 oz (83.2 kg)    SpO2: 97% 91% 92% 94%    Intake/Output Summary (Last 24 hours) at 01/15/13 1021 Last data filed at 01/15/13 0839  Gross per 24 hour  Intake    678 ml  Output   2950 ml  Net  -2272 ml    LABS: Basic Metabolic Panel:  Recent Labs  01/14/13 0429 01/15/13 0417  NA 140 141  K 4.0 3.7  CL 96 94*  CO2 29 32  GLUCOSE 144* 162*  BUN 68* 74*  CREATININE 3.06* 3.04*  CALCIUM 9.4 9.9   CBC: No results found for this basename: WBC, NEUTROABS, HGB, HCT, MCV, PLT,  in the last 72 hours Radiology/Studies:  No new data  Physical Exam: Blood pressure 149/66, pulse 92, temperature 97.6 F (36.4 C), temperature source Oral, resp. rate 20, height 5\' 7"  (1.702 m), weight 183 lb 6.8 oz (83.2 kg), SpO2 94.00%. Weight change: -7 lb 7.6 oz (-3.392 kg)   Decreased breath sounds at the right base Irregularly irregular rhythm, rate is well controlled. Lower extremity trace edema  ASSESSMENT:  1. Chronic atrial fibrillation : HR has improved on metoprolol   She was ambulating in the halls today without problems 2. Acute on chronic kidney disease, stage III-IV 3. Overall volume overload aggravating diastolic heart failure and causing an acute on chronic picture.  THis is improved.  She appears euvolemic and is lying flat without difficulty. 4. COPD  She will likely go  home in the morning.  She can see Dr. Fransico Him for cardiology follow up.  We can follow her in our coumadin clinic [phone])  Will sign off.  Call for questions   Ramond Dial., MD, [facility] 01/15/2013, 10:21 AM Office - [phone] Pager [phone]

## 2013-01-15 NOTE — Progress Notes (Signed)
PHARMACY FOLLOW UP NOTE   Pharmacy Consult for : Coumadin  Indication: atrial fibrillation  Dosing Weight: 83 kg  Labs:  Recent Labs  01/13/13 0530 01/14/13 0429 01/15/13 0417  LABPROT 19.8* 28.1* 32.0*  INR 1.74* 2.74* 3.25*  CREATININE 3.20* 3.06* 3.04*   Lab Results  Component Value Date   INR 3.25* 01/15/2013   INR 2.74* 01/14/2013   INR 1.74* 01/13/2013    Estimated Creatinine Clearance: 16.6 ml/min (by C-G formula based on Cr of 3.04).  Pertinent Medications:  Scheduled:  . ipratropium  0.5 mg Nebulization BID   And  . albuterol  2.5 mg Nebulization BID  . budesonide (PULMICORT) nebulizer solution  0.5 mg Nebulization BID  . ferrous sulfate  325 mg Oral TID WC  . furosemide  40 mg Oral BID  . insulin aspart  0-5 Units Subcutaneous QHS  . insulin aspart  0-9 Units Subcutaneous TID WC  . insulin aspart  4 Units Subcutaneous TID WC  . levothyroxine  50 mcg Oral QAC breakfast  . metoprolol tartrate  100 mg Oral BID  . mirtazapine  15 mg Oral Daily  . omega-3 acid ethyl esters  1 g Oral BID  . [START ON 01/16/2013] predniSONE  20 mg Oral Q breakfast  . Warfarin - Pharmacist Dosing Inpatient   Does not apply q1800    Assessment:  Patient continues on Coumadin for history atrial fibrillation.  INR trending up.  No bleeding complications noted   Levaquin discontinued yesterday.  Goal:  INR 2-3   Plan: 1. No Coumadin today. 2. Daily INR's, CBC.  Monitor for bleeding complications.    Velda Wendt, Tereso Newcomer.D 01/15/2013, 12:24 PM

## 2013-01-15 NOTE — Progress Notes (Signed)
[facility] PROGRESS NOTE  Jackie Alvarez [id number] DOB: 02/22/1932 DOA: 01/06/2013 PCP: Maurice Small, D, MD  Assessment/Plan: Acute renal failure, possible due to lotensin, Lasix, baseline creatinine 1.6, may have had some AKI secondary to dehydration. Initial UA with hyaline casts. Creatinine stable, good urine output. Appreciate nephrology assistance  - Renal duplex per nephrology done (read below) Acute hypoxic resp failure due to acute on chronic diastolic heart failure and possible COPD exac  Acute on chronic diastolic heart failure, BNP elevated and pulm edema on CXR  - lasix 80 bid iv switched to lasix 40 po bid 11/9 - good UOP, on room air Moderate right pleural effusion, likely due to acute diastolic CHF exacerbation and hydration in the setting of AKI  - If persistent and not improving with diuresis, consider thoracentesis  - Will need close outpatient follow up by Dr. [name], pulmonology, particularly if not improving  Acute COPD exacerbation  - Fast taper since improving with diuresis  - Continue duonebs  - Levofloxacin day 5/5 for COPD exacerbation  - Continue Budesonide BID  Klebsiella UTI,  - Continue antibiotics day 5/5 of levofloxacin  Atrial fibrillation CHad2Vasc2 score= 5, rate better, asymptomatic  - A/C per cardiology - up titrating Metoprolol to 100 BID now - Appreciate cardiology assistance  Borderline hypotension, BP stable. continue to hold amlodipine and doxazosin  Hypothyroidism - continue levothyroxine 50 mcg every morning  - last TSH in 2010. Repeat here normal Diabetes mellitus - HbA1c 5.7, FS high due to steroids, but tapering quickly.  - Continue SSI  Osteoporosis - hold calcium vitamin D for now  Cryptogenic anemia - workup in process by oncology-continue ferrous sulfate  Hyperlipidemia - Hold omega-3 fatty acids for now  Depression/sleep - continue mirtazapine 15 each bedtime   Diet: renal Fluids: none DVT Prophylaxis: on  Coumadin  Code Status: Full Family Communication: none  Disposition Plan: inpatient, home when ready  Consultants:  Nephrology  Cardiology  Procedures:  Renal artery duplex Summary:  - No evidence of renal artery stenosis noted bilaterally. RAR is 2.4 on the right and 1.8 on the left. - Bilateral abnormal intrarenal resistive indices.    Antibiotics  Anti-infectives   Start     Dose/Rate Route Frequency Ordered Stop   01/14/13 0800  levofloxacin (LEVAQUIN) IVPB 500 mg  Status:  Discontinued     500 mg 100 mL/hr over 60 Minutes Intravenous Every 48 hours 01/12/13 0850 01/13/13 1019   01/14/13 0800  levofloxacin (LEVAQUIN) tablet 500 mg     500 mg Oral Every 48 hours 01/13/13 1019 01/14/13 0908   01/12/13 0800  levofloxacin (LEVAQUIN) IVPB 500 mg  Status:  Discontinued     500 mg 100 mL/hr over 60 Minutes Intravenous Every 48 hours 01/10/13 0750 01/12/13 0850   01/10/13 0800  levofloxacin (LEVAQUIN) IVPB 750 mg     750 mg 100 mL/hr over 90 Minutes Intravenous  Once 01/10/13 0750 01/10/13 1440     Antibiotics Given (last 72 hours)   Date/Time Action Medication Dose   01/14/13 0908 Given   levofloxacin (LEVAQUIN) tablet 500 mg 500 mg      HPI/Subjective: - feeling overall weak, but appreciates improvement.   Objective: Filed Vitals:   01/14/13 2013 01/15/13 0502 01/15/13 0924 01/15/13 1015  BP: 135/92 138/71  149/66  Pulse: 59 73  92  Temp: 98.1 F (36.7 C) 97.9 F (36.6 C)  97.6 F (36.4 C)  TempSrc: Oral Oral  Oral  Resp: 20 20  Height:      Weight:  83.2 kg (183 lb 6.8 oz)    SpO2: 97% 91% 92% 94%    Intake/Output Summary (Last 24 hours) at 01/15/13 1030 Last data filed at 01/15/13 0839  Gross per 24 hour  Intake    678 ml  Output   2950 ml  Net  -2272 ml   Filed Weights   01/13/13 0546 01/14/13 0556 01/15/13 0502  Weight: 89.721 kg (197 lb 12.8 oz) 86.592 kg (190 lb 14.4 oz) 83.2 kg (183 lb 6.8 oz)    Exam:   General:   NAD  Cardiovascular: irregular, tachycardic  Respiratory: decreased on right lower field, no wheezing  Abdomen: soft, not tender to palpation, positive bowel sounds  MSK: trace edema  Neuro: non focal  Data Reviewed: Basic Metabolic Panel:  Recent Labs Lab 01/11/13 0452 01/12/13 0535 01/13/13 0530 01/14/13 0429 01/15/13 0417  NA 142 141 142 140 141  K 5.0 4.4 4.2 4.0 3.7  CL 105 103 102 96 94*  CO2 25 25 27 29  32  GLUCOSE 242* 181* 119* 144* 162*  BUN 33* 41* 56* 68* 74*  CREATININE 2.67* 3.00* 3.20* 3.06* 3.04*  CALCIUM 9.0 9.2 9.4 9.4 9.9  PHOS  --  4.1  --   --   --    Liver Function Tests:  Recent Labs Lab 01/12/13 0535  ALBUMIN 3.3*   CBC:  Recent Labs Lab 01/10/13 0605 01/11/13 0452 01/12/13 0535  WBC 5.6 3.8* 8.7  HGB 9.3* 10.0* 10.2*  HCT 28.4* 30.1* 31.0*  MCV 96.9 95.6 95.4  PLT 150 164 198   BNP (last 3 results)  Recent Labs  01/10/13 0605  PROBNP 7146.0*   CBG:  Recent Labs Lab 01/14/13 0552 01/14/13 1100 01/14/13 1648 01/14/13 2119 01/15/13 0616  GLUCAP 130* 259* 291* 145* 155*    Recent Results (from the past 240 hour(s))  URINE CULTURE     Status: None   Collection Time    01/07/13  6:26 AM      Result Value Range Status   Specimen Description URINE, CLEAN CATCH   Final   Special Requests NONE   Final   Culture  Setup Time     Final   Value: 01/07/2013 19:19     Performed at [facility]     Final   Value: >=100,000 COLONIES/ML     Performed at [facility]   Culture     Final   Value: KLEBSIELLA PNEUMONIAE     Performed at [facility]   Report Status 01/09/2013 FINAL   Final   Organism ID, Bacteria KLEBSIELLA PNEUMONIAE   Final     Studies: US Renal  01/13/2013   CLINICAL DATA:  Acute renal failure.  EXAM: RENAL/URINARY TRACT ULTRASOUND COMPLETE  COMPARISON:  CT 03/29/2008.  FINDINGS: Right Kidney  Length: 10.4. Tiny cortical echogenicity in the right upper renal pole  may represent a parenchymal calcification or cysts with echogenic debris. 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  Left Kidney  Length: 10.7 cm long axis. Suboptimal visualization. No gross hydronephrosis.  Bladder  Appears normal for degree of bladder distention.  Left pleural effusion noted.  IMPRESSION: Negative for hydronephrosis or stone disease. Tiny echogenic focus in the right upper renal pole cortex probably represents a small echogenic cyst or parenchymal calcification.   Electronically Signed   By: Dereck Ligas M.D.   On: 01/13/2013  16:00    Scheduled Meds: . ipratropium  0.5 mg Nebulization BID   And  . albuterol  2.5 mg Nebulization BID  . budesonide (PULMICORT) nebulizer solution  0.5 mg Nebulization BID  . ferrous sulfate  325 mg Oral TID WC  . furosemide  40 mg Oral BID  . insulin aspart  0-5 Units Subcutaneous QHS  . insulin aspart  0-9 Units Subcutaneous TID WC  . insulin aspart  4 Units Subcutaneous TID WC  . levothyroxine  50 mcg Oral QAC breakfast  . metoprolol tartrate  100 mg Oral BID  . mirtazapine  15 mg Oral Daily  . omega-3 acid ethyl esters  1 g Oral BID  . [START ON 01/16/2013] predniSONE  20 mg Oral Q breakfast  . Warfarin - Pharmacist Dosing Inpatient   Does not apply q1800   Continuous Infusions:  Principal Problem:   Acute on chronic renal failure Active Problems:   Peripheral vascular disease, unspecified   COPD (chronic obstructive pulmonary disease) with emphysema   Thyroid disease   Anemia   HTN (hypertension)   Chronic diastolic heart failure   Acute respiratory failure with hypoxia   COPD with acute exacerbation   Acute on chronic diastolic heart failure   Chronic atrial fibrillation  Time spent: [place], MD [facility] Pager [phone]. If 7 PM - 7 AM, please contact night-coverage at [email or web address], password [facility] 01/15/2013, 10:30 AM  LOS: 9 days

## 2013-01-15 NOTE — Progress Notes (Signed)
S: Feels better  O:BP 138/71  Pulse 73  Temp(Src) 97.9 F (36.6 C) (Oral)  Resp 20  Ht 5\' 7"  (1.702 m)  Wt 83.2 kg (183 lb 6.8 oz)  BMI 28.72 kg/m2  SpO2 91%  Intake/Output Summary (Last 24 hours) at 01/15/13 0911 Last data filed at 01/15/13 0839  Gross per 24 hour  Intake    678 ml  Output   2950 ml  Net  -2272 ml   Weight change: -3.392 kg (-7 lb 7.6 oz) [id number] and alert [id number], irreg Resp:decreased BS rt base Abd:+ BS NTND Ext:tr edema NEURO:CNI Ox3 no asterixis   . ipratropium  0.5 mg Nebulization BID   And  . albuterol  2.5 mg Nebulization BID  . budesonide (PULMICORT) nebulizer solution  0.5 mg Nebulization BID  . ferrous sulfate  325 mg Oral TID WC  . furosemide  80 mg Oral BID  . insulin aspart  0-5 Units Subcutaneous QHS  . insulin aspart  0-9 Units Subcutaneous TID WC  . insulin aspart  4 Units Subcutaneous TID WC  . levothyroxine  50 mcg Oral QAC breakfast  . metoprolol tartrate  100 mg Oral BID  . mirtazapine  15 mg Oral Daily  . omega-3 acid ethyl esters  1 g Oral BID  . [START ON 01/16/2013] predniSONE  20 mg Oral Q breakfast  . Warfarin - Pharmacist Dosing Inpatient   Does not apply q1800   US Renal  01/13/2013   CLINICAL DATA:  Acute renal failure.  EXAM: RENAL/URINARY TRACT ULTRASOUND COMPLETE  COMPARISON:  CT 03/29/2008.  FINDINGS: Right Kidney  Length: 10.4. Tiny cortical echogenicity in the right upper renal pole may represent a parenchymal calcification or cysts with echogenic debris. 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  Left Kidney  Length: 10.7 cm long axis. Suboptimal visualization. No gross hydronephrosis.  Bladder  Appears normal for degree of bladder distention.  Left pleural effusion noted.  IMPRESSION: Negative for hydronephrosis or stone disease. Tiny echogenic focus in the right upper renal pole cortex probably represents a small echogenic cyst or parenchymal calcification.   Electronically Signed   By:  Dereck Ligas M.D.   On: 01/13/2013 16:00   BMET    Component Value Date/Time   NA 141 01/15/2013 0417   NA 149* 11/14/2012 1206   K 3.7 01/15/2013 0417   K 3.6 11/14/2012 1206   CL 94* 01/15/2013 0417   CO2 32 01/15/2013 0417   CO2 30* 11/14/2012 1206   GLUCOSE 162* 01/15/2013 0417   GLUCOSE 134 11/14/2012 1206   BUN 74* 01/15/2013 0417   BUN 30.3* 11/14/2012 1206   CREATININE 3.04* 01/15/2013 0417   CREATININE 1.7* 11/14/2012 1206   CALCIUM 9.9 01/15/2013 0417   CALCIUM 10.1 11/14/2012 1206   GFRNONAA 14* 01/15/2013 0417   GFRAA 16* 01/15/2013 0417   CBC    Component Value Date/Time   WBC 8.7 01/12/2013 0535   WBC 4.5 11/14/2012 1206   RBC 3.25* 01/12/2013 0535   RBC 2.98* 11/14/2012 1206   HGB 10.2* 01/12/2013 0535   HGB 9.3* 11/14/2012 1206   HCT 31.0* 01/12/2013 0535   HCT 28.6* 11/14/2012 1206   PLT 198 01/12/2013 0535   PLT 121* 11/14/2012 1206   MCV 95.4 01/12/2013 0535   MCV 96.0 11/14/2012 1206   MCH 31.4 01/12/2013 0535   MCH 31.2 11/14/2012 1206   MCHC 32.9 01/12/2013 0535   MCHC 32.5 11/14/2012 1206   RDW  14.6 01/12/2013 0535   RDW 15.0* 11/14/2012 1206   LYMPHSABS 1.0 01/06/2013 2231   LYMPHSABS 1.0 11/14/2012 1206   MONOABS 0.3 01/06/2013 2231   MONOABS 0.2 11/14/2012 1206   EOSABS 0.1 01/06/2013 2231   EOSABS 0.2 11/14/2012 1206   BASOSABS 0.0 01/06/2013 2231   BASOSABS 0.0 11/14/2012 1206     Assessment: 1.  Acute on CKD 3 in setting of poor po intake and ACE.  Scr stabel with good UO 2. Klebsiella UTI 3. DM 4. HTN  Had renal doppler done yest 5. Rt pleural effusion 6. Anemia and iron def 7. Sec HPTH  Plan:  1.  Decrease lasix to 40mg  BID and this can be increased to 80mg  BID prn for edema 2. Wait to start Vit D for Sec HPTH as outpt 3. Recheck Scr in AM.  If stable then could go from renal standpoint with outpt FU.  Lorraine Terriquez T

## 2013-01-16 LAB — GLUCOSE, CAPILLARY
Glucose-Capillary: 172 mg/dL — ABNORMAL HIGH (ref 70–99)
Glucose-Capillary: 170 mg/dL — ABNORMAL HIGH (ref 70–99)

## 2013-01-16 LAB — PROTIME-INR
INR: 2.18 — ABNORMAL HIGH (ref 0.00–1.49)
Prothrombin Time: 23.6 seconds — ABNORMAL HIGH (ref 11.6–15.2)

## 2013-01-16 LAB — BASIC METABOLIC PANEL
Calcium: 10.1 mg/dL (ref 8.4–10.5)
Creatinine, Ser: 2.93 mg/dL — ABNORMAL HIGH (ref 0.50–1.10)
GFR calc Af Amer: 16 mL/min — ABNORMAL LOW (ref >90)
GFR calc non Af Amer: 14 mL/min — ABNORMAL LOW (ref >90)
Glucose, Bld: 171 mg/dL — ABNORMAL HIGH (ref 70–99)
Potassium: 3.2 mEq/L — ABNORMAL LOW (ref 3.5–5.1)
Sodium: 140 mEq/L (ref 135–145)

## 2013-01-16 MED ORDER — DILTIAZEM HCL ER COATED BEADS 120 MG PO CP24
120.0000 mg | ORAL_CAPSULE | Freq: Every day | ORAL | Status: DC
  Administered 2013-01-16: 13:00:00 120 mg via ORAL
  Filled 2013-01-16 (×2): qty 1

## 2013-01-16 MED ORDER — POTASSIUM CHLORIDE CRYS ER 20 MEQ PO TBCR
40.0000 meq | EXTENDED_RELEASE_TABLET | Freq: Once | ORAL | Status: AC
  Administered 2013-01-16: 40 meq via ORAL
  Filled 2013-01-16: qty 2

## 2013-01-16 MED ORDER — WARFARIN SODIUM 2.5 MG PO TABS
2.5000 mg | ORAL_TABLET | Freq: Once | ORAL | Status: AC
  Administered 2013-01-16: 2.5 mg via ORAL
  Filled 2013-01-16: qty 1

## 2013-01-16 NOTE — Progress Notes (Addendum)
    Subjective:  Pleasant, no CP, no fevers, no cough. AFIB RVR continues.   Objective:  Vital Signs in the last 24 hours: Temp:  [97.4 F (36.3 C)-98.1 F (36.7 C)] 97.7 F (36.5 C) (11/10 0946) Pulse Rate:  [65-110] 94 (11/10 0946) Resp:  [18-20] 18 (11/10 0946) BP: (121-151)/(45-74) 126/45 mmHg (11/10 0946) SpO2:  [91 %-97 %] 97 % (11/10 0946) Weight:  [178 lb 12.7 oz (81.1 kg)] 178 lb 12.7 oz (81.1 kg) (11/10 0443)  Intake/Output from previous day: 11/09 0701 - 11/10 0700 In: 700 [P.O.:700] Out: 1325 [Urine:1325]   Physical Exam: General: Weak appearing , in no acute distress. Head:  Normocephalic and atraumatic. Lungs: Clear to auscultation and percussion. Heart: Irreg irreg tachy, 2/6 S murmur RUSB, rubs or gallops.  Abdomen: soft, non-tender, positive bowel sounds. Extremities: No clubbing or cyanosis. No edema. Neurologic: Alert and oriented x 3.    Lab Results: No results found for this basename: WBC, HGB, PLT,  in the last 72 hours  Recent Labs  01/15/13 0417 01/16/13 0400  NA 141 140  K 3.7 3.2*  CL 94* 89*  CO2 32 34*  GLUCOSE 162* 171*  BUN 74* 78*  CREATININE 3.04* 2.93*    Telemetry: HR up to 140 with bath. Now 110-120.  Personally viewed.   Cardiac Studies:  Normal EF  Assessment/Plan:  Principal Problem:   Acute on chronic renal failure Active Problems:   Peripheral vascular disease, unspecified   COPD (chronic obstructive pulmonary disease) with emphysema   Thyroid disease   Anemia   HTN (hypertension)   Chronic diastolic heart failure   Acute respiratory failure with hypoxia   COPD with acute exacerbation   Acute on chronic diastolic heart failure   Chronic atrial fibrillation  1) AFIB  - still needs improved rate control  - Will add diltiazem CD 120mg  QD to her metoprolol 100mg  BID.  - Avoid digoxin. Renal.   2) Anticoagulation  - Warfarin (off NOAC, renal)  3) Chronic/acute diastolic HF  - EF normal. Diuresis (limited  by CKD 4).   - Appreciate renal discussion. Avoiding ARB/ACE-I)  - Lasix 40mg  BID  4) COPD  - primary team. Ambulate.   5) CKD  - Creat in 3 range. Renal following. Reviewed note.   Hopeful DC tomorrow.    SKAINS, [place] 01/16/2013, 10:40 AM

## 2013-01-16 NOTE — Progress Notes (Signed)
[facility] ROUNDING NOTE   Subjective:   Interval History: improved renal function  Objective:  Vital signs in last 24 hours:  Temp:  [97.4 F (36.3 C)-98.1 F (36.7 C)] 97.7 F (36.5 C) (11/10 0946) Pulse Rate:  [65-110] 94 (11/10 0946) Resp:  [18-20] 18 (11/10 0946) BP: (121-151)/(45-74) 126/45 mmHg (11/10 0946) SpO2:  [91 %-97 %] 97 % (11/10 0946) Weight:  [81.1 kg (178 lb 12.7 oz)] 81.1 kg (178 lb 12.7 oz) (11/10 0443)  Weight change: -2.1 kg (-4 lb 10.1 oz) Filed Weights   01/14/13 0556 01/15/13 0502 01/16/13 0443  Weight: 86.592 kg (190 lb 14.4 oz) 83.2 kg (183 lb 6.8 oz) 81.1 kg (178 lb 12.7 oz)    Intake/Output: I/O last 3 completed shifts: In: 818 [P.O.:818] Out: 2825 [Urine:2825]   Intake/Output this shift:  Total I/O In: 360 [P.O.:360] Out: 200 [Urine:200] [id number] and alert  [id number], irreg  Resp:decreased BS rt base  Abd:+ BS NTND  Ext:tr edema    Basic Metabolic Panel:  Recent Labs Lab 01/12/13 0535 01/13/13 0530 01/14/13 0429 01/15/13 0417 01/16/13 0400  NA 141 142 140 141 140  K 4.4 4.2 4.0 3.7 3.2*  CL 103 102 96 94* 89*  CO2 25 27 29  32 34*  GLUCOSE 181* 119* 144* 162* 171*  BUN 41* 56* 68* 74* 78*  CREATININE 3.00* 3.20* 3.06* 3.04* 2.93*  CALCIUM 9.2 9.4 9.4 9.9 10.1  PHOS 4.1  --   --   --   --     Liver Function Tests:  Recent Labs Lab 01/12/13 0535  ALBUMIN 3.3*   No results found for this basename: LIPASE, AMYLASE,  in the last 168 hours No results found for this basename: AMMONIA,  in the last 168 hours  CBC:  Recent Labs Lab 01/10/13 0605 01/11/13 0452 01/12/13 0535  WBC 5.6 3.8* 8.7  HGB 9.3* 10.0* 10.2*  HCT 28.4* 30.1* 31.0*  MCV 96.9 95.6 95.4  PLT 150 164 198    Cardiac Enzymes: No results found for this basename: CKTOTAL, CKMB, CKMBINDEX, TROPONINI,  in the last 168 hours  BNP: No components found with this basename: POCBNP,   CBG:  Recent Labs Lab 01/15/13 0616  01/15/13 1117 01/15/13 1553 01/15/13 2110 01/16/13 0612  GLUCAP 155* 227* 372* 142* 172*    Microbiology: Results for orders placed during the hospital encounter of 01/06/13  URINE CULTURE     Status: None   Collection Time    01/07/13  6:26 AM      Result Value Range Status   Specimen Description URINE, CLEAN CATCH   Final   Special Requests NONE   Final   Culture  Setup Time     Final   Value: 01/07/2013 19:19     Performed at [facility]     Final   Value: >=100,000 COLONIES/ML     Performed at [facility]   Culture     Final   Value: KLEBSIELLA PNEUMONIAE     Performed at [facility]   Report Status 01/09/2013 FINAL   Final   Organism ID, Bacteria KLEBSIELLA PNEUMONIAE   Final    Coagulation Studies:  Recent Labs  01/14/13 0429 01/15/13 0417 01/16/13 0400  LABPROT 28.1* 32.0* 23.6*  INR 2.74* 3.25* 2.18*    Urinalysis:  Recent Labs  01/13/13 1627  COLORURINE YELLOW  LABSPEC 1.007  PHURINE 5.0  GLUCOSEU 250*  HGBUR NEGATIVE  BILIRUBINUR NEGATIVE  KETONESUR NEGATIVE  PROTEINUR NEGATIVE  UROBILINOGEN 0.2  NITRITE NEGATIVE  LEUKOCYTESUR TRACE*      Imaging: No results found.   Medications:     . ipratropium  0.5 mg Nebulization BID   And  . albuterol  2.5 mg Nebulization BID  . budesonide (PULMICORT) nebulizer solution  0.5 mg Nebulization BID  . ferrous sulfate  325 mg Oral TID WC  . furosemide  40 mg Oral BID  . insulin aspart  0-5 Units Subcutaneous QHS  . insulin aspart  0-9 Units Subcutaneous TID WC  . insulin aspart  4 Units Subcutaneous TID WC  . levothyroxine  50 mcg Oral QAC breakfast  . metoprolol tartrate  100 mg Oral BID  . mirtazapine  15 mg Oral Daily  . omega-3 acid ethyl esters  1 g Oral BID  . predniSONE  20 mg Oral Q breakfast  . warfarin  2.5 mg Oral ONCE-1800  . Warfarin - Pharmacist Dosing Inpatient   Does not apply q1800   acetaminophen, acetaminophen, albuterol,  bisacodyl, HYDROmorphone (DILAUDID) injection, ondansetron (ZOFRAN) IV, ondansetron, oxyCODONE, zolpidem  Assessment/ Plan:  79yo WF admitted 01/07/13 for abnormal Scr obtained on outpatient labs. Has hx CKD 3 with Scr 1.7 in 9/14, 1.6 in 10/14 and then was 3.5 on 01/04/13 and increased to 4.2 on 01/06/13 (scr 1.3 in 12/10 and 1.4 1/13) Pt was on benazepril and admits to poor PO intake over last 3 wks   Acute on Chronic renal failure. Stop ACE and no ARBs or NSAIDS. We shall sign off today and follow up arranged with Dr Mercy Moore as an outpatient   LOS: 35 Myrene Bougher W @TODAY @10 :35 AM

## 2013-01-16 NOTE — Progress Notes (Signed)
Patient's heart rate is in the 170s, the patient is asymptomatic and eating lunch, schedule Cardizem given and MD made aware; will continue to monitor patient. Ruben Im RN

## 2013-01-16 NOTE — Progress Notes (Signed)
Patient heart rate has been sustaining between 115-130; will continue to monitor patient. Ruben Im RN

## 2013-01-16 NOTE — Progress Notes (Signed)
PHARMACY FOLLOW UP NOTE   Pharmacy Consult for : Coumadin  Indication: atrial fibrillation  Dosing Weight: 83 kg  Labs:  Recent Labs  01/14/13 0429 01/15/13 0417 01/16/13 0400  LABPROT 28.1* 32.0* 23.6*  INR 2.74* 3.25* 2.18*  CREATININE 3.06* 3.04* 2.93*   Lab Results  Component Value Date   INR 2.18* 01/16/2013   INR 3.25* 01/15/2013   INR 2.74* 01/14/2013   Estimated Creatinine Clearance: 17.1 ml/min (by C-G formula based on Cr of 2.93).  Pertinent Medications:  Scheduled:  . ipratropium  0.5 mg Nebulization BID   And  . albuterol  2.5 mg Nebulization BID  . budesonide (PULMICORT) nebulizer solution  0.5 mg Nebulization BID  . ferrous sulfate  325 mg Oral TID WC  . furosemide  40 mg Oral BID  . insulin aspart  0-5 Units Subcutaneous QHS  . insulin aspart  0-9 Units Subcutaneous TID WC  . insulin aspart  4 Units Subcutaneous TID WC  . levothyroxine  50 mcg Oral QAC breakfast  . metoprolol tartrate  100 mg Oral BID  . mirtazapine  15 mg Oral Daily  . omega-3 acid ethyl esters  1 g Oral BID  . predniSONE  20 mg Oral Q breakfast  . Warfarin - Pharmacist Dosing Inpatient   Does not apply q1800   Assessment:  Patient continues on Coumadin for history atrial fibrillation.  INR is now within desired goal range at 2.18.  Dose was held yesterday due to fairly large jump.  She has no noted bleeding complications and her CBC has been stable.  Levaquin discontinued yesterday and no further drug/drug interacting meds noted.  Goal:  INR 2-3   Plan: 1. Will give Warfarin 2.5mg  x 1 today 2. Daily INR's, CBC - weekly.  Monitor for bleeding complications.    Rober Minion, PharmD., MS Clinical Pharmacist Pager:  [phone] Thank you for allowing pharmacy to be part of this patients care team. 01/16/2013, 10:04 AM

## 2013-01-16 NOTE — Progress Notes (Signed)
[facility] PROGRESS NOTE  Jackie Alvarez [id number] DOB: 1931-05-05 DOA: 01/06/2013 PCP: Maurice Small, D, MD  Assessment/Plan:  Atrial fibrillation CHad2Vasc2 score= 5. Now on Metop 100 BID, with HR in 110-130s still. CCB started today per cardiology. Appreciate input.  Acute renal failure, possible due to lotensin, Lasix, baseline creatinine 1.6, may have had some AKI secondary to  dehydration. Initial UA with hyaline casts. Creatinine stable, good urine output. Appreciate nephrology assistance  - Cr improved.  Acute hypoxic resp failure due to acute on chronic diastolic heart failure and possible COPD exac  Acute on chronic diastolic heart failure, BNP elevated and pulm edema on CXR  - lasix 80 bid iv switched to lasix 40 po bid 11/9 - good UOP, on room air Moderate right pleural effusion, likely due to acute diastolic CHF exacerbation and hydration in the setting of AKI  - If persistent and not improving with diuresis, consider thoracentesis  - Will need close outpatient follow up by Dr. [name], pulmonology, particularly if not improving  Acute COPD exacerbation  - Fast taper since improving with diuresis  - Continue duonebs  - Levofloxacin completed for COPD exacerbation  - Continue Budesonide BID  Klebsiella UTI,  - Continue antibiotics day 5/5 of levofloxacin  Borderline hypotension, BP stable. continue to hold amlodipine and doxazosin  Hypothyroidism - continue levothyroxine 50 mcg every morning  - last TSH in 2010. Repeat here normal Diabetes mellitus - HbA1c 5.7, FS high due to steroids, but tapering quickly.  - Continue SSI  Osteoporosis - hold calcium vitamin D for now  Cryptogenic anemia - workup in process by oncology-continue ferrous sulfate  Hyperlipidemia - Hold omega-3 fatty acids for now  Depression/sleep - continue mirtazapine 15 each bedtime   Diet: renal Fluids: none DVT Prophylaxis: on Coumadin  Code Status: Full Family Communication: none   Disposition Plan: inpatient, home when ready and heart rate improved.   Consultants:  Nephrology  Cardiology  Procedures:  Renal artery duplex Summary:  - No evidence of renal artery stenosis noted bilaterally. RAR is 2.4 on the right and 1.8 on the left. - Bilateral abnormal intrarenal resistive indices.    Antibiotics  Anti-infectives   Start     Dose/Rate Route Frequency Ordered Stop   01/14/13 0800  levofloxacin (LEVAQUIN) IVPB 500 mg  Status:  Discontinued     500 mg 100 mL/hr over 60 Minutes Intravenous Every 48 hours 01/12/13 0850 01/13/13 1019   01/14/13 0800  levofloxacin (LEVAQUIN) tablet 500 mg     500 mg Oral Every 48 hours 01/13/13 1019 01/14/13 0908   01/12/13 0800  levofloxacin (LEVAQUIN) IVPB 500 mg  Status:  Discontinued     500 mg 100 mL/hr over 60 Minutes Intravenous Every 48 hours 01/10/13 0750 01/12/13 0850   01/10/13 0800  levofloxacin (LEVAQUIN) IVPB 750 mg     750 mg 100 mL/hr over 90 Minutes Intravenous  Once 01/10/13 0750 01/10/13 1440     Antibiotics Given (last 72 hours)   Date/Time Action Medication Dose   01/14/13 0908 Given   levofloxacin (LEVAQUIN) tablet 500 mg 500 mg      HPI/Subjective: - feeling overall weak  Objective: Filed Vitals:   01/15/13 2009 01/16/13 0443 01/16/13 0837 01/16/13 0946  BP:  151/60  126/45  Pulse: 110 65  94  Temp:  98.1 F (36.7 C)  97.7 F (36.5 C)  TempSrc:  Oral  Oral  Resp: 20 20  18   Height:  Weight:  81.1 kg (178 lb 12.7 oz)    SpO2: 96% 91% 94% 97%    Intake/Output Summary (Last 24 hours) at 01/16/13 1301 Last data filed at 01/16/13 1100  Gross per 24 hour  Intake    960 ml  Output   1425 ml  Net   -465 ml   Filed Weights   01/14/13 0556 01/15/13 0502 01/16/13 0443  Weight: 86.592 kg (190 lb 14.4 oz) 83.2 kg (183 lb 6.8 oz) 81.1 kg (178 lb 12.7 oz)    Exam:   General:  NAD  Cardiovascular: irregular, tachycardic  Respiratory: decreased on right lower field, no  wheezing  Abdomen: soft, not tender to palpation, positive bowel sounds  MSK: trace edema  Neuro: non focal  Data Reviewed: Basic Metabolic Panel:  Recent Labs Lab 01/12/13 0535 01/13/13 0530 01/14/13 0429 01/15/13 0417 01/16/13 0400  NA 141 142 140 141 140  K 4.4 4.2 4.0 3.7 3.2*  CL 103 102 96 94* 89*  CO2 25 27 29  32 34*  GLUCOSE 181* 119* 144* 162* 171*  BUN 41* 56* 68* 74* 78*  CREATININE 3.00* 3.20* 3.06* 3.04* 2.93*  CALCIUM 9.2 9.4 9.4 9.9 10.1  PHOS 4.1  --   --   --   --    Liver Function Tests:  Recent Labs Lab 01/12/13 0535  ALBUMIN 3.3*   CBC:  Recent Labs Lab 01/10/13 0605 01/11/13 0452 01/12/13 0535  WBC 5.6 3.8* 8.7  HGB 9.3* 10.0* 10.2*  HCT 28.4* 30.1* 31.0*  MCV 96.9 95.6 95.4  PLT 150 164 198   BNP (last 3 results)  Recent Labs  01/10/13 0605  PROBNP 7146.0*   CBG:  Recent Labs Lab 01/15/13 1117 01/15/13 1553 01/15/13 2110 01/16/13 0612 01/16/13 1139  GLUCAP 227* 372* 142* 172* 274*    Recent Results (from the past 240 hour(s))  URINE CULTURE     Status: None   Collection Time    01/07/13  6:26 AM      Result Value Range Status   Specimen Description URINE, CLEAN CATCH   Final   Special Requests NONE   Final   Culture  Setup Time     Final   Value: 01/07/2013 19:19     Performed at [facility]     Final   Value: >=100,000 COLONIES/ML     Performed at [facility]   Culture     Final   Value: KLEBSIELLA PNEUMONIAE     Performed at [facility]   Report Status 01/09/2013 FINAL   Final   Organism ID, Bacteria KLEBSIELLA PNEUMONIAE   Final     Studies: No results found.  Scheduled Meds: . ipratropium  0.5 mg Nebulization BID   And  . albuterol  2.5 mg Nebulization BID  . budesonide (PULMICORT) nebulizer solution  0.5 mg Nebulization BID  . diltiazem  120 mg Oral Daily  . ferrous sulfate  325 mg Oral TID WC  . furosemide  40 mg Oral BID  . insulin aspart  0-5 Units  Subcutaneous QHS  . insulin aspart  0-9 Units Subcutaneous TID WC  . insulin aspart  4 Units Subcutaneous TID WC  . levothyroxine  50 mcg Oral QAC breakfast  . metoprolol tartrate  100 mg Oral BID  . mirtazapine  15 mg Oral Daily  . omega-3 acid ethyl esters  1 g Oral BID  . predniSONE  20 mg Oral Q breakfast  .  warfarin  2.5 mg Oral ONCE-1800  . Warfarin - Pharmacist Dosing Inpatient   Does not apply q1800   Continuous Infusions:  Principal Problem:   Acute on chronic renal failure Active Problems:   Peripheral vascular disease, unspecified   COPD (chronic obstructive pulmonary disease) with emphysema   Thyroid disease   Anemia   HTN (hypertension)   Chronic diastolic heart failure   Acute respiratory failure with hypoxia   COPD with acute exacerbation   Acute on chronic diastolic heart failure   Chronic atrial fibrillation  Time spent: [place], MD [facility] Pager [phone]. If 7 PM - 7 AM, please contact night-coverage at [email or web address], password [facility] 01/16/2013, 1:01 PM  LOS: 10 days

## 2013-01-17 LAB — POTASSIUM: Potassium: 4.2 mEq/L (ref 3.5–5.1)

## 2013-01-17 LAB — GLUCOSE, CAPILLARY: Glucose-Capillary: 234 mg/dL — ABNORMAL HIGH (ref 70–99)

## 2013-01-17 MED ORDER — INSULIN ASPART 100 UNIT/ML ~~LOC~~ SOLN
0.0000 [IU] | Freq: Three times a day (TID) | SUBCUTANEOUS | Status: DC
  Administered 2013-01-18 (×2): 15 [IU] via SUBCUTANEOUS
  Administered 2013-01-18 – 2013-01-20 (×5): 11 [IU] via SUBCUTANEOUS
  Administered 2013-01-20: 7 [IU] via SUBCUTANEOUS

## 2013-01-17 MED ORDER — DILTIAZEM HCL ER COATED BEADS 180 MG PO CP24
180.0000 mg | ORAL_CAPSULE | Freq: Every day | ORAL | Status: DC
  Administered 2013-01-17 – 2013-01-18 (×2): 180 mg via ORAL
  Filled 2013-01-17 (×2): qty 1

## 2013-01-17 MED ORDER — INSULIN ASPART 100 UNIT/ML ~~LOC~~ SOLN
0.0000 [IU] | Freq: Every day | SUBCUTANEOUS | Status: DC
  Administered 2013-01-17: 22:00:00 4 [IU] via SUBCUTANEOUS
  Administered 2013-01-18: 21:00:00 2 [IU] via SUBCUTANEOUS

## 2013-01-17 MED ORDER — WARFARIN SODIUM 5 MG PO TABS
5.0000 mg | ORAL_TABLET | Freq: Once | ORAL | Status: AC
  Administered 2013-01-17: 5 mg via ORAL
  Filled 2013-01-17: qty 1

## 2013-01-17 NOTE — Progress Notes (Signed)
Patient's CBG is 436, MD notified and orders given; will continue to monitor patient. Ruben Im RN

## 2013-01-17 NOTE — Progress Notes (Signed)
SUBJECTIVE:  No complaints.  Lying flat with no SOB  OBJECTIVE:   Vitals:   Filed Vitals:   01/16/13 1412 01/16/13 2004 01/16/13 2011 01/17/13 0603  BP: 119/55 135/64  142/53  Pulse: 90 68  72  Temp: 97.3 F (36.3 C) 97.2 F (36.2 C)  97.9 F (36.6 C)  TempSrc: Oral Oral  Oral  Resp: 20 20  18   Height:      Weight:    176 lb 2.4 oz (79.9 kg)  SpO2: 93% 97% 98% 92%   I&O's:   Intake/Output Summary (Last 24 hours) at 01/17/13 0915 Last data filed at 01/17/13 0850  Gross per 24 hour  Intake   1080 ml  Output   1800 ml  Net   -720 ml   TELEMETRY: Reviewed telemetry pt in atrial fibrillation     PHYSICAL EXAM General: Well developed, well nourished, in no acute distress Head: Eyes PERRLA, No xanthomas.   Normal cephalic and atramatic  Lungs:   Clear bilaterally to auscultation and percussion. Heart:   HRRR S1 S2 Pulses are 2+ & equal. Abdomen: Bowel sounds are positive, abdomen soft and non-tender without masses  Extremities:   No clubbing, cyanosis or edema.  DP +1 Neuro: Alert and oriented X 3. Psych:  Good affect, responds appropriately   LABS: Basic Metabolic Panel:  Recent Labs  01/15/13 0417 01/16/13 0400 01/17/13 0552  NA 141 140  --   K 3.7 3.2* 4.2  CL 94* 89*  --   CO2 32 34*  --   GLUCOSE 162* 171*  --   BUN 74* 78*  --   CREATININE 3.04* 2.93*  --   CALCIUM 9.9 10.1  --     Coag Panel:   Lab Results  Component Value Date   INR 1.79* 01/17/2013   INR 2.18* 01/16/2013   INR 3.25* 01/15/2013    RADIOLOGY: US Renal  01/13/2013   CLINICAL DATA:  Acute renal failure.  EXAM: RENAL/URINARY TRACT ULTRASOUND COMPLETE  COMPARISON:  CT 03/29/2008.  FINDINGS: Right Kidney  Length: 10.4. Tiny cortical echogenicity in the right upper renal pole may represent a parenchymal calcification or cysts with echogenic debris. 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  Left Kidney  Length: 10.7 cm long axis. Suboptimal visualization. No  gross hydronephrosis.  Bladder  Appears normal for degree of bladder distention.  Left pleural effusion noted.  IMPRESSION: Negative for hydronephrosis or stone disease. Tiny echogenic focus in the right upper renal pole cortex probably represents a small echogenic cyst or parenchymal calcification.   Electronically Signed   By: Dereck Ligas M.D.   On: 01/13/2013 16:00   Dg Chest Port 1 View  01/12/2013   CLINICAL DATA:  75-year- female with nonproductive cough and pleural effusion. Initial encounter.  EXAM: PORTABLE CHEST - 1 VIEW  COMPARISON:  01/10/2013 and earlier.  FINDINGS: Portable AP upright view at 1017 hr. Small to moderate right pleural effusion probably not significantly changed allowing for differences in patient positioning. Continued increased interstitial opacity. No pneumothorax. Persistent left retrocardiac opacity but no definite left effusion. Stable postoperative changes to the left axilla. Stable cardiac size and mediastinal contours.  IMPRESSION: Small to moderate right pleural effusion not significantly changed allowing for different patient positioning. Right greater than left lower lobe collapse or consolidation. Continued vascular congestion.   Electronically Signed   By: Lars Pinks M.D.   On: 01/12/2013 17:36   Dg Chest [facility]  01/10/2013   CLINICAL DATA:  Shortness of breath  EXAM: PORTABLE CHEST - 1 VIEW  COMPARISON:  11/14/2012  FINDINGS: Cardiac shadow is stable. A moderate size right-sided pleural effusion is noted increased from the prior exam. Some mild interstitial changes are seen which may be related to mild vascular congestion.  IMPRESSION: Moderate right-sided pleural effusion new from the prior exam.  Mild vascular congestion.   Electronically Signed   By: Inez Catalina M.D.   On: 01/10/2013 07:35    Assessment/Plan:  Principal Problem:  Acute on chronic renal failure  Active Problems:  Peripheral vascular disease, unspecified  COPD (chronic obstructive  pulmonary disease) with emphysema  Thyroid disease  Anemia  HTN (hypertension)  Chronic diastolic heart failure  Acute respiratory failure with hypoxia  COPD with acute exacerbation  Acute on chronic diastolic heart failure  Chronic atrial fibrillation   1) AFIB  -  rate control much improved but still around 100 - Increase diltiazem CD to 180mg  QD as well as continue etoprolol 100mg  BID.  - Avoid digoxin due to renal insufficiency 2) Anticoagulation  - Warfarin (off NOAC due to  Renal insuff)  - Followup in our coumadin clinic on Thursday 3) Chronic/acute diastolic HF - EF normal. Diuresis (limited by CKD 4).  - Appreciate renal discussion. Avoiding ARB/ACE-I)  - Lasix 40mg  BID  4) COPD  - primary team. Ambulate.  5) CKD  - Creat in 3 range. Renal following. Reviewed note.  Hopeful DC later today    Sueanne Margarita, MD  01/17/2013  9:15 AM

## 2013-01-17 NOTE — Progress Notes (Signed)
Inpatient Diabetes Program Recommendations  AACE/ADA: New Consensus Statement on Inpatient Glycemic Control (2013)  Target Ranges:  Prepandial:   less than 140 mg/dL      Peak postprandial:   less than 180 mg/dL (1-2 hours)      Critically ill patients:  140 - 180 mg/dL   Reason for Visit: Hyperglycemia Results for PHOEBE, REGAL (MRN [id number]) as of 01/17/2013 16:13  Ref. Range 01/16/2013 11:39 01/16/2013 15:59 01/16/2013 21:23 01/17/2013 06:38 01/17/2013 11:24  Glucose-Capillary Latest Range: 70-99 mg/dL 274 (H) 315 (H) 170 (H) 234 (H) 275 (H)  Post-prandial blood sugars elevated.  Inpatient Diabetes Program Recommendations Insulin - Meal Coverage: Consider increasing Novolog meal coverage to 6 units tidwc Will continue to follow. Thank you. Lorenda Peck, RD, LDN, CDE Inpatient Diabetes Coordinator [phone]

## 2013-01-17 NOTE — Progress Notes (Signed)
Physical Therapy Treatment Patient Details Name: TYIESHA FILHO MRN: [id number] DOB: Sep 05, 1931 Today's Date: 01/17/2013 Time: [id number] PT Time Calculation (min): 19 min  PT Assessment / Plan / Recommendation  History of Present Illness Namiah L Zelada is a 79 y.o. female who presents to the ED after lab results this week revealed rapid worsening of her renal function  She had been seen in her PCP's office 3 days ago and had labs done which revealed an increase in her BUN and Cr to 35/3.5 and she was told to stop her Lotensin Rx, and her Lasix Rx.    She had a repeat check on Friday and the numbers were further elevated at  41/4.2  and she was referred to the ED for evalauation.    PT Comments   Activity limited by HR. 116 at rest with increase to 155 with 30' ambulation and returned to sitting. RN and MD aware.   Follow Up Recommendations  Home health PT     Does the patient have the potential to tolerate intense rehabilitation     Barriers to Discharge        Equipment Recommendations       Recommendations for Other Services    Frequency     Progress towards PT Goals Progress towards PT goals: Not progressing toward goals - comment (secondary to Afib with HR up to 155)  Plan Current plan remains appropriate    Precautions / Restrictions Precautions Precautions: Fall Restrictions Weight Bearing Restrictions: No   Pertinent Vitals/Pain No pain Pt asymptomatic    Mobility  Bed Mobility Supine to Sit: 6: Modified independent (Device/Increase time);HOB flat (no rails) Sitting - Scoot to Edge of Bed: 6: Modified independent (Device/Increase time) Details for Bed Mobility Assistance: no use of rail today  Transfers Sit to Stand: 5: Supervision;From bed Stand to Sit: 5: Supervision;To chair/3-in-1;With armrests Details for Transfer Assistance: supervision for cues for hand placment and safety  Ambulation/Gait Ambulation/Gait Assistance: 5: Supervision Ambulation Distance  (Feet): 30 Feet Assistive device: Rolling walker;None Ambulation/Gait Assistance Details: first 41' without RW and with use of RW pt with increased stride and stability. Ambulation limited by HR jumping to 155 Gait Pattern: Step-through pattern;Decreased stride length Gait velocity: decreased Stairs: No    Exercises General Exercises - Lower Extremity Long Arc Quad: AROM;Seated;Both;20 reps Hip ABduction/ADduction: AROM;Seated;Both;20 reps Hip Flexion/Marching: AROM;Seated;Both;20 reps Toe Raises: AROM;Seated;Both;20 reps Heel Raises: Seated;AROM;Both;20 reps   PT Diagnosis:    PT Problem List:   PT Treatment Interventions:     PT Goals (current goals can now be found in the care plan section)    Visit Information  Last PT Received On: 01/17/13 Assistance Needed: +1 History of Present Illness: Mayson L Ranganathan is a 79 y.o. female who presents to the ED after lab results this week revealed rapid worsening of her renal function  She had been seen in her PCP's office 3 days ago and had labs done which revealed an increase in her BUN and Cr to 35/3.5 and she was told to stop her Lotensin Rx, and her Lasix Rx.    She had a repeat check on Friday and the numbers were further elevated at  41/4.2  and she was referred to the ED for evalauation.     Subjective Data      Cognition  Cognition Arousal/Alertness: Awake/alert Behavior During Therapy: WFL for tasks assessed/performed Overall Cognitive Status: Within Functional Limits for tasks assessed    Balance     End  of Session PT - End of Session Equipment Utilized During Treatment: Gait belt Activity Tolerance: Treatment limited secondary to medical complications (Comment) Patient left: in chair;with call bell/phone within reach;with nursing/sitter in room Nurse Communication: Mobility status   GP     Melford Aase 01/17/2013, 12:15 PM Elwyn Reach, [place]

## 2013-01-17 NOTE — Progress Notes (Signed)
[facility] PROGRESS NOTE  Jackie Alvarez [id number] DOB: 11-Jul-1931 DOA: 01/06/2013 PCP: Maurice Small, D, MD  Assessment/Plan:  Atrial fibrillation CHad2Vasc2 score= 5. Now on Metop 100 BID, with HR in 110-130s still. - CCB started 11/10, up-titrated 11/11, patient still with heart rate in the 150s with walking.  Acute renal failure, possible due to lotensin, Lasix, baseline creatinine 1.6, may have had some AKI secondary to  dehydration. Initial UA with hyaline casts. Creatinine stable, good urine output. Appreciate nephrology assistance  - Cr improved.  Acute hypoxic resp failure due to acute on chronic diastolic heart failure and possible COPD exac  Acute on chronic diastolic heart failure, BNP elevated and pulm edema on CXR  - lasix 80 bid iv switched to lasix 40 po bid 11/9 Moderate right pleural effusion, likely due to acute diastolic CHF exacerbation and hydration in the setting of AKI  Acute COPD exacerbation  - Fast taper since improving with diuresis  - Continue duonebs  - Levofloxacin completed for COPD exacerbation  - Continue Budesonide BID  Klebsiella UTI,  - Continue antibiotics day 5/5 of levofloxacin  Borderline hypotension, BP stable. continue to hold amlodipine and doxazosin  Hypothyroidism - continue levothyroxine 50 mcg every morning  - last TSH in 2010. Repeat here normal Diabetes mellitus - HbA1c 5.7, FS high due to steroids, but tapering quickly.  - Continue SSI  Osteoporosis - hold calcium vitamin D for now  Cryptogenic anemia - workup in process by oncology-continue ferrous sulfate  Hyperlipidemia - Hold omega-3 fatty acids for now  Depression/sleep - continue mirtazapine 15 each bedtime   Diet: renal Fluids: none DVT Prophylaxis: on Coumadin  Code Status: Full Family Communication: none  Disposition Plan: inpatient, home when ready and heart rate improved.   Consultants:  Nephrology  Cardiology  Procedures:  Renal artery  duplex Summary: - No evidence of renal artery stenosis noted bilaterally. RAR is 2.4 on the right and 1.8 on the left. - Bilateral abnormal intrarenal resistive indices.    Antibiotics  Anti-infectives   Start     Dose/Rate Route Frequency Ordered Stop   01/14/13 0800  levofloxacin (LEVAQUIN) IVPB 500 mg  Status:  Discontinued     500 mg 100 mL/hr over 60 Minutes Intravenous Every 48 hours 01/12/13 0850 01/13/13 1019   01/14/13 0800  levofloxacin (LEVAQUIN) tablet 500 mg     500 mg Oral Every 48 hours 01/13/13 1019 01/14/13 0908   01/12/13 0800  levofloxacin (LEVAQUIN) IVPB 500 mg  Status:  Discontinued     500 mg 100 mL/hr over 60 Minutes Intravenous Every 48 hours 01/10/13 0750 01/12/13 0850   01/10/13 0800  levofloxacin (LEVAQUIN) IVPB 750 mg     750 mg 100 mL/hr over 90 Minutes Intravenous  Once 01/10/13 0750 01/10/13 1440     Antibiotics Given (last 72 hours)   None      HPI/Subjective: - feeling better, somewhat upset that she cannot go home because of her heart rate.   Objective: Filed Vitals:   01/16/13 2004 01/16/13 2011 01/17/13 0603 01/17/13 0943  BP: 135/64  142/53 138/63  Pulse: 68  72 81  Temp: 97.2 F (36.2 C)  97.9 F (36.6 C)   TempSrc: Oral  Oral   Resp: 20  18 18   Height:      Weight:   79.9 kg (176 lb 2.4 oz)   SpO2: 97% 98% 92% 95%    Intake/Output Summary (Last 24 hours) at 01/17/13 1048 Last data filed  at 01/17/13 0850  Gross per 24 hour  Intake   1080 ml  Output   1800 ml  Net   -720 ml   Filed Weights   01/15/13 0502 01/16/13 0443 01/17/13 0603  Weight: 83.2 kg (183 lb 6.8 oz) 81.1 kg (178 lb 12.7 oz) 79.9 kg (176 lb 2.4 oz)    Exam:   General:  NAD  Cardiovascular: irregular, tachycardic  Respiratory: decreased on right lower field, no wheezing  Abdomen: soft, not tender to palpation, positive bowel sounds  MSK: trace edema  Neuro: non focal  Data Reviewed: Basic Metabolic Panel:  Recent Labs Lab 01/12/13 0535  01/13/13 0530 01/14/13 0429 01/15/13 0417 01/16/13 0400 01/17/13 0552  NA 141 142 140 141 140  --   K 4.4 4.2 4.0 3.7 3.2* 4.2  CL 103 102 96 94* 89*  --   CO2 25 27 29  32 34*  --   GLUCOSE 181* 119* 144* 162* 171*  --   BUN 41* 56* 68* 74* 78*  --   CREATININE 3.00* 3.20* 3.06* 3.04* 2.93*  --   CALCIUM 9.2 9.4 9.4 9.9 10.1  --   PHOS 4.1  --   --   --   --   --    Liver Function Tests:  Recent Labs Lab 01/12/13 0535  ALBUMIN 3.3*   CBC:  Recent Labs Lab 01/11/13 0452 01/12/13 0535  WBC 3.8* 8.7  HGB 10.0* 10.2*  HCT 30.1* 31.0*  MCV 95.6 95.4  PLT 164 198   BNP (last 3 results)  Recent Labs  01/10/13 0605  PROBNP 7146.0*   CBG:  Recent Labs Lab 01/16/13 0612 01/16/13 1139 01/16/13 1559 01/16/13 2123 01/17/13 0638  GLUCAP 172* 274* 315* 170* 234*    Studies: No results found.  Scheduled Meds: . ipratropium  0.5 mg Nebulization BID   And  . albuterol  2.5 mg Nebulization BID  . budesonide (PULMICORT) nebulizer solution  0.5 mg Nebulization BID  . diltiazem  180 mg Oral Daily  . ferrous sulfate  325 mg Oral TID WC  . furosemide  40 mg Oral BID  . insulin aspart  0-5 Units Subcutaneous QHS  . insulin aspart  0-9 Units Subcutaneous TID WC  . insulin aspart  4 Units Subcutaneous TID WC  . levothyroxine  50 mcg Oral QAC breakfast  . metoprolol tartrate  100 mg Oral BID  . mirtazapine  15 mg Oral Daily  . omega-3 acid ethyl esters  1 g Oral BID  . predniSONE  20 mg Oral Q breakfast  . warfarin  5 mg Oral ONCE-1800  . Warfarin - Pharmacist Dosing Inpatient   Does not apply q1800   Continuous Infusions:  Principal Problem:   Acute on chronic renal failure Active Problems:   Peripheral vascular disease, unspecified   COPD (chronic obstructive pulmonary disease) with emphysema   Thyroid disease   Anemia   HTN (hypertension)   Chronic diastolic heart failure   Acute respiratory failure with hypoxia   COPD with acute exacerbation   Acute on  chronic diastolic heart failure   Chronic atrial fibrillation  Time spent: [place], MD [facility] Pager [phone]. If 7 PM - 7 AM, please contact night-coverage at [email or web address], password [facility] 01/17/2013, 10:48 AM  LOS: 11 days

## 2013-01-17 NOTE — Progress Notes (Signed)
PHARMACY FOLLOW UP NOTE   Pharmacy Consult for : Coumadin  Indication: atrial fibrillation  Dosing Weight: 83 kg  Labs:  Recent Labs  01/15/13 0417 01/16/13 0400 01/17/13 0552  LABPROT 32.0* 23.6* 20.3*  INR 3.25* 2.18* 1.79*  CREATININE 3.04* 2.93*  --    Lab Results  Component Value Date   INR 1.79* 01/17/2013   INR 2.18* 01/16/2013   INR 3.25* 01/15/2013   Estimated Creatinine Clearance: 16.9 ml/min (by C-G formula based on Cr of 2.93).  Pertinent Medications:  Scheduled:  . ipratropium  0.5 mg Nebulization BID   And  . albuterol  2.5 mg Nebulization BID  . budesonide (PULMICORT) nebulizer solution  0.5 mg Nebulization BID  . diltiazem  180 mg Oral Daily  . ferrous sulfate  325 mg Oral TID WC  . furosemide  40 mg Oral BID  . insulin aspart  0-5 Units Subcutaneous QHS  . insulin aspart  0-9 Units Subcutaneous TID WC  . insulin aspart  4 Units Subcutaneous TID WC  . levothyroxine  50 mcg Oral QAC breakfast  . metoprolol tartrate  100 mg Oral BID  . mirtazapine  15 mg Oral Daily  . omega-3 acid ethyl esters  1 g Oral BID  . predniSONE  20 mg Oral Q breakfast  . Warfarin - Pharmacist Dosing Inpatient   Does not apply q1800   Assessment:  Patient continues on Coumadin for history atrial fibrillation.  INR has dropped to 1.79 overnight likely due to holding doses on 11/8 and 11/9 due to elevated INR.  Dose has been charted as given.  She has no noted bleeding complications and her CBC has been stable.  Still with issues regarding rate control with her Afib - Diltiazem was added to her regimen and HR improved though still elevated.    Goal:  INR 2-3   Plan: 1. Will give Warfarin 5mg  x 1 today 2. Daily INR's, CBC - weekly.  Monitor for bleeding complications.    Rober Minion, PharmD., MS Clinical Pharmacist Pager:  [phone] Thank you for allowing pharmacy to be part of this patients care team. 01/17/2013, 9:49 AM

## 2013-01-18 ENCOUNTER — Other Ambulatory Visit: Payer: Medicare Other

## 2013-01-18 LAB — CBC
HCT: 35.9 % — ABNORMAL LOW (ref 36.0–46.0)
Hemoglobin: 12.1 g/dL (ref 12.0–15.0)
MCH: 32.2 pg (ref 26.0–34.0)
MCHC: 33.7 g/dL (ref 30.0–36.0)
WBC: 12.7 10*3/uL — ABNORMAL HIGH (ref 4.0–10.5)

## 2013-01-18 LAB — GLUCOSE, CAPILLARY
Glucose-Capillary: 65 mg/dL — ABNORMAL LOW (ref 70–99)
Glucose-Capillary: 88 mg/dL (ref 70–99)

## 2013-01-18 LAB — BASIC METABOLIC PANEL
BUN: 77 mg/dL — ABNORMAL HIGH (ref 6–23)
Calcium: 9.9 mg/dL (ref 8.4–10.5)
Chloride: 90 mEq/L — ABNORMAL LOW (ref 96–112)
GFR calc non Af Amer: 15 mL/min — ABNORMAL LOW (ref >90)
Glucose, Bld: 338 mg/dL — ABNORMAL HIGH (ref 70–99)
Potassium: 3.6 mEq/L (ref 3.5–5.1)

## 2013-01-18 LAB — PROTIME-INR: INR: 2.58 — ABNORMAL HIGH (ref 0.00–1.49)

## 2013-01-18 MED ORDER — PREDNISONE 5 MG PO TABS
5.0000 mg | ORAL_TABLET | Freq: Every day | ORAL | Status: DC
  Filled 2013-01-18: qty 1

## 2013-01-18 MED ORDER — WARFARIN SODIUM 2 MG PO TABS
2.0000 mg | ORAL_TABLET | Freq: Once | ORAL | Status: AC
  Administered 2013-01-18: 2 mg via ORAL
  Filled 2013-01-18: qty 1

## 2013-01-18 MED ORDER — DILTIAZEM HCL ER COATED BEADS 240 MG PO CP24
240.0000 mg | ORAL_CAPSULE | Freq: Every day | ORAL | Status: DC
  Administered 2013-01-19 – 2013-01-20 (×2): 240 mg via ORAL
  Filled 2013-01-18 (×2): qty 1

## 2013-01-18 MED ORDER — AMIODARONE HCL 200 MG PO TABS
200.0000 mg | ORAL_TABLET | Freq: Two times a day (BID) | ORAL | Status: DC
  Administered 2013-01-18 – 2013-01-20 (×4): 200 mg via ORAL
  Filled 2013-01-18 (×6): qty 1

## 2013-01-18 NOTE — Progress Notes (Signed)
1200 cbg =65 no signs of hypoglycemia . Orange juice 1 cup given , Rechecked aftter 15 mins cbg=85 . Md aware

## 2013-01-18 NOTE — Progress Notes (Addendum)
SUBJECTIVE:  No complaints.  Lying flat with no SOB  OBJECTIVE:   Vitals:   Filed Vitals:   01/18/13 0530 01/18/13 0854 01/18/13 0855 01/18/13 1353  BP: 146/70 133/48  118/73  Pulse: 88 98  75  Temp: 98.1 F (36.7 C)   98 F (36.7 C)  TempSrc: Oral   Oral  Resp: 17 16  18   Height:      Weight: 173 lb 12.8 oz (78.835 kg)     SpO2: 93% 85% 97% 95%   I&O's:    Intake/Output Summary (Last 24 hours) at 01/18/13 1810 Last data filed at 01/18/13 1352  Gross per 24 hour  Intake    940 ml  Output   1200 ml  Net   -260 ml   TELEMETRY: Reviewed telemetry pt in atrial fibrillation with rapid ventricular response     PHYSICAL EXAM General: Well developed, well nourished, in no acute distress Head:   Normal cephalic and atramatic  Lungs:   Clear bilaterally to auscultation and percussion. Heart:   Irregularly irregular, S1-S2 Abdomen:abdomen soft and non-tender   Extremities:   No edema.   Neuro: Alert and oriented X 3. Psych:  Normal affect, responds appropriately   LABS: Basic Metabolic Panel:  Recent Labs  01/16/13 0400 01/17/13 0552 01/18/13 0525  NA 140  --  139  K 3.2* 4.2 3.6  CL 89*  --  90*  CO2 34*  --  32  GLUCOSE 171*  --  338*  BUN 78*  --  77*  CREATININE 2.93*  --  2.88*  CALCIUM 10.1  --  9.9    Coag Panel:   Lab Results  Component Value Date   INR 2.58* 01/18/2013   INR 1.79* 01/17/2013   INR 2.18* 01/16/2013    RADIOLOGY: US Renal  01/13/2013   CLINICAL DATA:  Acute renal failure.  EXAM: RENAL/URINARY TRACT ULTRASOUND COMPLETE  COMPARISON:  CT 03/29/2008.  FINDINGS: Right Kidney  Length: 10.4. Tiny cortical echogenicity in the right upper renal pole may represent a parenchymal calcification or cysts with echogenic debris. 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  Left Kidney  Length: 10.7 cm long axis. Suboptimal visualization. No gross hydronephrosis.  Bladder  Appears normal for degree of bladder distention.   Left pleural effusion noted.  IMPRESSION: Negative for hydronephrosis or stone disease. Tiny echogenic focus in the right upper renal pole cortex probably represents a small echogenic cyst or parenchymal calcification.   Electronically Signed   By: Dereck Ligas M.D.   On: 01/13/2013 16:00   Dg Chest Port 1 View  01/12/2013   CLINICAL DATA:  29-year- female with nonproductive cough and pleural effusion. Initial encounter.  EXAM: PORTABLE CHEST - 1 VIEW  COMPARISON:  01/10/2013 and earlier.  FINDINGS: Portable AP upright view at 1017 hr. Small to moderate right pleural effusion probably not significantly changed allowing for differences in patient positioning. Continued increased interstitial opacity. No pneumothorax. Persistent left retrocardiac opacity but no definite left effusion. Stable postoperative changes to the left axilla. Stable cardiac size and mediastinal contours.  IMPRESSION: Small to moderate right pleural effusion not significantly changed allowing for different patient positioning. Right greater than left lower lobe collapse or consolidation. Continued vascular congestion.   Electronically Signed   By: Lars Pinks M.D.   On: 01/12/2013 17:36   Dg Chest Port 1 View  01/10/2013   CLINICAL DATA:  Shortness of breath  EXAM: PORTABLE CHEST - 1  VIEW  COMPARISON:  11/14/2012  FINDINGS: Cardiac shadow is stable. A moderate size right-sided pleural effusion is noted increased from the prior exam. Some mild interstitial changes are seen which may be related to mild vascular congestion.  IMPRESSION: Moderate right-sided pleural effusion new from the prior exam.  Mild vascular congestion.   Electronically Signed   By: Inez Catalina M.D.   On: 01/10/2013 07:35    Assessment/Plan:  Principal Problem:  Acute on chronic renal failure  Active Problems:  Peripheral vascular disease, unspecified  COPD (chronic obstructive pulmonary disease) with emphysema  Thyroid disease  Anemia  HTN (hypertension)    Chronic diastolic heart failure  Acute respiratory failure with hypoxia  COPD with acute exacerbation  Acute on chronic diastolic heart failure  Chronic atrial fibrillation   1) AFIB  -  rate control suboptimal with resting HR > 100 - Increase diltiazem CD to 240mg  QD as well as continue metoprolol 100mg  BID.  Not much change with this.  Start Amiodarone 200 mg BID.  She is therapeutic on Coumadin at this time.  I explained rationale behind amiodarone to her at length. She understands and has no objections to trying this medication. Her husband was on this medicine prior to his death. Could consider cardioversion after she has been therapeutic on Coumadin for a month or sooner, but this would likely require transesophageal echocardiogram since she has had an INR which was subtherapeutic. - Avoid digoxin due to renal insufficiency 2) Anticoagulation  - Warfarin (off NOAC due to  Renal insuff)  - Followup in our coumadin clinic on Thursday 3) Chronic/acute diastolic HF - EF normal. Diuresis (limited by CKD 4).  - Appreciate renal discussion. Avoiding ARB/ACE-I)  - Lasix 40mg  BID  4) COPD  - primary team. Ambulate.    Jettie Booze., MD  01/18/2013  6:10 PM

## 2013-01-18 NOTE — Progress Notes (Signed)
PHARMACY FOLLOW UP NOTE   Pharmacy Consult for : Coumadin  Indication: atrial fibrillation  Dosing Weight: 83 kg  Labs:  Recent Labs  01/16/13 0400 01/17/13 0552 01/18/13 0525  HGB  --   --  12.1  HCT  --   --  35.9*  PLT  --   --  220  LABPROT 23.6* 20.3* 26.8*  INR 2.18* 1.79* 2.58*  CREATININE 2.93*  --   --    Lab Results  Component Value Date   INR 2.58* 01/18/2013   INR 1.79* 01/17/2013   INR 2.18* 01/16/2013   Estimated Creatinine Clearance: 16.8 ml/min (by C-G formula based on Cr of 2.93).  Pertinent Medications:  Scheduled:  . ipratropium  0.5 mg Nebulization BID   And  . albuterol  2.5 mg Nebulization BID  . budesonide (PULMICORT) nebulizer solution  0.5 mg Nebulization BID  . diltiazem  180 mg Oral Daily  . ferrous sulfate  325 mg Oral TID WC  . furosemide  40 mg Oral BID  . insulin aspart  0-20 Units Subcutaneous TID WC  . insulin aspart  0-5 Units Subcutaneous QHS  . insulin aspart  4 Units Subcutaneous TID WC  . levothyroxine  50 mcg Oral QAC breakfast  . metoprolol tartrate  100 mg Oral BID  . mirtazapine  15 mg Oral Daily  . omega-3 acid ethyl esters  1 g Oral BID  . predniSONE  20 mg Oral Q breakfast  . Warfarin - Pharmacist Dosing Inpatient   Does not apply q1800   Assessment:  Patient continues on Coumadin for history atrial fibrillation.  INR therapeutic today with fairly large increase from yesterday  She has no noted bleeding complications and her CBC has been stable.  Still with issues regarding rate control with her Afib -  Goal:  INR 2-3   Plan: 1. Will give Warfarin 2mg  x 1 today 2. Daily INR's, CBC - weekly.  Monitor for bleeding complications.    Excell Seltzer, PharmD Clinical Pharmacist Thank you for allowing pharmacy to be part of this patients care team. 01/18/2013, 7:24 AM

## 2013-01-18 NOTE — Progress Notes (Signed)
NUTRITION FOLLOW UP  Intervention:   Recommend removal of Renal restrictions, current renal labs are WNL. Recommend Heart Healthy diet. RD to continue to follow nutrition care plan.  Nutrition Dx:   Inadequate oral intake related to poor appetite and taste changes as evidenced by pt report, resolved.  Goal:   Patient will meet >/=90% of estimated nutrition needs, meeting  Monitor:   PO intake, weight, labs, I/Os  Assessment:   PMHx significant for DM, HTN, CKD, COPD, CA, . Admitted with acute on chronic renal failure from MD office.  Renal team signed off on 11/10. Remains inpatient 2/2 frequent elevations in heart rate.  Patient is eating very well, currently consuming 100% of her Renal 80/90 meals. Potassium and phosphorus are WNL. Pt denies any questions/concerns regarding nutrition.  Height: Ht Readings from Last 1 Encounters:  01/07/13 5\' 7"  (1.702 m)    Weight Status:   Wt Readings from Last 1 Encounters:  01/18/13 173 lb 12.8 oz (78.835 kg)  Admit wt 191 lb; net fluid balance of -3.8 liters  Re-estimated needs:  Kcal: 1700 - 1900  Protein: 65 - 75 g  Fluid: 1.7 - 1.9 liters  Skin:  trace edema Stage I pressure ulcer to R buttocks  Diet Order: Renal 80-90   Intake/Output Summary (Last 24 hours) at 01/18/13 1229 Last data filed at 01/18/13 1030  Gross per 24 hour  Intake   1060 ml  Output    800 ml  Net    260 ml    Last BM: 11/9   Labs:   Recent Labs Lab 01/12/13 0535  01/15/13 0417 01/16/13 0400 01/17/13 0552 01/18/13 0525  NA 141  < > 141 140  --  139  K 4.4  < > 3.7 3.2* 4.2 3.6  CL 103  < > 94* 89*  --  90*  CO2 25  < > 32 34*  --  32  BUN 41*  < > 74* 78*  --  77*  CREATININE 3.00*  < > 3.04* 2.93*  --  2.88*  CALCIUM 9.2  < > 9.9 10.1  --  9.9  PHOS 4.1  --   --   --   --   --   GLUCOSE 181*  < > 162* 171*  --  338*  < > = values in this interval not displayed.  CBG (last 3)   Recent Labs  01/17/13 2148 01/18/13 0645  01/18/13 1116  GLUCAP 315* 319* 65*    Scheduled Meds: . ipratropium  0.5 mg Nebulization BID   And  . albuterol  2.5 mg Nebulization BID  . budesonide (PULMICORT) nebulizer solution  0.5 mg Nebulization BID  . diltiazem  180 mg Oral Daily  . ferrous sulfate  325 mg Oral TID WC  . furosemide  40 mg Oral BID  . insulin aspart  0-20 Units Subcutaneous TID WC  . insulin aspart  0-5 Units Subcutaneous QHS  . insulin aspart  4 Units Subcutaneous TID WC  . levothyroxine  50 mcg Oral QAC breakfast  . metoprolol tartrate  100 mg Oral BID  . mirtazapine  15 mg Oral Daily  . omega-3 acid ethyl esters  1 g Oral BID  . [START ON 01/19/2013] predniSONE  5 mg Oral Q breakfast  . warfarin  2 mg Oral ONCE-1800  . Warfarin - Pharmacist Dosing Inpatient   Does not apply q1800    Continuous Infusions:   Inda Coke MS, RD, LDN Pager: [phone]  After-hours pager: [phone]

## 2013-01-18 NOTE — Progress Notes (Signed)
[facility] PROGRESS NOTE  Jackie Alvarez [id number] DOB: 03-31-1931 DOA: 01/06/2013 PCP: Maurice Small, D, MD  Assessment/Plan:  Atrial fibrillation with RVR - patient still with elevated heart rates despite titration of BB and CCB. Heart rate 110 at rest, saw 140-150s with ambulation.  CHad2Vasc2 score= 5. Now on Metop 100 BID, with HR in 110-130s still. Acute renal failure, possible due to lotensin, Lasix, baseline creatinine 1.6, may have had some AKI secondary to  dehydration. Initial UA with hyaline casts. Creatinine stable, good urine output. Appreciate nephrology assistance  - Cr improved.  Acute hypoxic resp failure due to acute on chronic diastolic heart failure and possible COPD exac  Acute on chronic diastolic heart failure, BNP elevated and pulm edema on CXR  - lasix 80 bid iv switched to lasix 40 po bid 11/9 - stable Moderate right pleural effusion, likely due to acute diastolic CHF exacerbation and hydration in the setting of AKI  Acute COPD exacerbation  - Fast taper since improving with diuresis; last prednisone dose 11/12 - Continue duonebs  - Levofloxacin completed for COPD exacerbation  - Continue Budesonide BID  Klebsiella UTI,  - Continue antibiotics day 5/5 of levofloxacin  Borderline hypotension, BP stable. continue to hold amlodipine and doxazosin  Hypothyroidism - continue levothyroxine 50 mcg every morning  - last TSH in 2010. Repeat here normal Diabetes mellitus - HbA1c 5.7, FS high due to steroids, but tapering quickly.  - Continue SSI  Osteoporosis - hold calcium vitamin D for now  Cryptogenic anemia - workup in process by oncology-continue ferrous sulfate  Hyperlipidemia - Hold omega-3 fatty acids for now  Depression/sleep - continue mirtazapine 15 each bedtime   Diet: renal Fluids: none DVT Prophylaxis: on Coumadin  Code Status: Full Family Communication: none  Disposition Plan: inpatient, home when ready and heart rate improved. Currently  sustained >100 bpm at rest   Consultants:  Nephrology  Cardiology  Procedures:  Renal artery duplex Summary: - No evidence of renal artery stenosis noted bilaterally. RAR is 2.4 on the right and 1.8 on the left. - Bilateral abnormal intrarenal resistive indices.    Antibiotics  Anti-infectives   Start     Dose/Rate Route Frequency Ordered Stop   01/14/13 0800  levofloxacin (LEVAQUIN) IVPB 500 mg  Status:  Discontinued     500 mg 100 mL/hr over 60 Minutes Intravenous Every 48 hours 01/12/13 0850 01/13/13 1019   01/14/13 0800  levofloxacin (LEVAQUIN) tablet 500 mg     500 mg Oral Every 48 hours 01/13/13 1019 01/14/13 0908   01/12/13 0800  levofloxacin (LEVAQUIN) IVPB 500 mg  Status:  Discontinued     500 mg 100 mL/hr over 60 Minutes Intravenous Every 48 hours 01/10/13 0750 01/12/13 0850   01/10/13 0800  levofloxacin (LEVAQUIN) IVPB 750 mg     750 mg 100 mL/hr over 90 Minutes Intravenous  Once 01/10/13 0750 01/10/13 1440     Antibiotics Given (last 72 hours)   None      HPI/Subjective: - feeling better, somewhat upset that she cannot go home because of her heart rate.   Objective: Filed Vitals:   01/17/13 2042 01/18/13 0530 01/18/13 0854 01/18/13 0855  BP: 138/88 146/70 133/48   Pulse: 85 88 98   Temp: 98.2 F (36.8 C) 98.1 F (36.7 C)    TempSrc: Oral Oral    Resp: 18 17 16    Height:      Weight:  78.835 kg (173 lb 12.8 oz)  SpO2: 97% 93% 85% 97%    Intake/Output Summary (Last 24 hours) at 01/18/13 1312 Last data filed at 01/18/13 1030  Gross per 24 hour  Intake   1060 ml  Output    800 ml  Net    260 ml   Filed Weights   01/16/13 0443 01/17/13 0603 01/18/13 0530  Weight: 81.1 kg (178 lb 12.7 oz) 79.9 kg (176 lb 2.4 oz) 78.835 kg (173 lb 12.8 oz)    Exam:   General:  NAD  Cardiovascular: irregular, tachycardic  Respiratory: decreased on right lower field, no wheezing  Abdomen: soft, not tender to palpation, positive bowel sounds  MSK:  trace edema  Neuro: non focal  Data Reviewed: Basic Metabolic Panel:  Recent Labs Lab 01/12/13 0535 01/13/13 0530 01/14/13 0429 01/15/13 0417 01/16/13 0400 01/17/13 0552 01/18/13 0525  NA 141 142 140 141 140  --  139  K 4.4 4.2 4.0 3.7 3.2* 4.2 3.6  CL 103 102 96 94* 89*  --  90*  CO2 25 27 29  32 34*  --  32  GLUCOSE 181* 119* 144* 162* 171*  --  338*  BUN 41* 56* 68* 74* 78*  --  77*  CREATININE 3.00* 3.20* 3.06* 3.04* 2.93*  --  2.88*  CALCIUM 9.2 9.4 9.4 9.9 10.1  --  9.9  PHOS 4.1  --   --   --   --   --   --    Liver Function Tests:  Recent Labs Lab 01/12/13 0535  ALBUMIN 3.3*   CBC:  Recent Labs Lab 01/12/13 0535 01/18/13 0525  WBC 8.7 12.7*  HGB 10.2* 12.1  HCT 31.0* 35.9*  MCV 95.4 95.5  PLT 198 220   BNP (last 3 results)  Recent Labs  01/10/13 0605  PROBNP 7146.0*   CBG:  Recent Labs Lab 01/17/13 1621 01/17/13 2148 01/18/13 0645 01/18/13 1116 01/18/13 1242  GLUCAP 436* 315* 319* 65* 88    Studies: No results found.  Scheduled Meds: . ipratropium  0.5 mg Nebulization BID   And  . albuterol  2.5 mg Nebulization BID  . budesonide (PULMICORT) nebulizer solution  0.5 mg Nebulization BID  . diltiazem  180 mg Oral Daily  . ferrous sulfate  325 mg Oral TID WC  . furosemide  40 mg Oral BID  . insulin aspart  0-20 Units Subcutaneous TID WC  . insulin aspart  0-5 Units Subcutaneous QHS  . insulin aspart  4 Units Subcutaneous TID WC  . levothyroxine  50 mcg Oral QAC breakfast  . metoprolol tartrate  100 mg Oral BID  . mirtazapine  15 mg Oral Daily  . omega-3 acid ethyl esters  1 g Oral BID  . [START ON 01/19/2013] predniSONE  5 mg Oral Q breakfast  . warfarin  2 mg Oral ONCE-1800  . Warfarin - Pharmacist Dosing Inpatient   Does not apply q1800   Continuous Infusions:  Principal Problem:   Acute on chronic renal failure Active Problems:   Peripheral vascular disease, unspecified   COPD (chronic obstructive pulmonary disease) with  emphysema   Thyroid disease   Anemia   HTN (hypertension)   Chronic diastolic heart failure   Acute respiratory failure with hypoxia   COPD with acute exacerbation   Acute on chronic diastolic heart failure   Chronic atrial fibrillation  Time spent: [place], MD [facility] Pager [phone]. If 7 PM - 7 AM, please contact night-coverage at [email or web address], password [facility] 01/18/2013,  1:12 PM  LOS: 12 days

## 2013-01-19 DIAGNOSIS — N179 Acute kidney failure, unspecified: Secondary | ICD-10-CM

## 2013-01-19 DIAGNOSIS — I4891 Unspecified atrial fibrillation: Secondary | ICD-10-CM

## 2013-01-19 DIAGNOSIS — J9600 Acute respiratory failure, unspecified whether with hypoxia or hypercapnia: Secondary | ICD-10-CM

## 2013-01-19 DIAGNOSIS — I5033 Acute on chronic diastolic (congestive) heart failure: Secondary | ICD-10-CM

## 2013-01-19 DIAGNOSIS — N189 Chronic kidney disease, unspecified: Secondary | ICD-10-CM

## 2013-01-19 LAB — GLUCOSE, CAPILLARY
Glucose-Capillary: 254 mg/dL — ABNORMAL HIGH (ref 70–99)
Glucose-Capillary: 121 mg/dL — ABNORMAL HIGH (ref 70–99)

## 2013-01-19 LAB — PROTIME-INR: Prothrombin Time: 33.5 seconds — ABNORMAL HIGH (ref 11.6–15.2)

## 2013-01-19 MED ORDER — FUROSEMIDE 40 MG PO TABS: 40.0000 mg | ORAL_TABLET | Freq: Two times a day (BID) | ORAL | Status: DC

## 2013-01-19 MED ORDER — FUROSEMIDE 40 MG PO TABS
40.0000 mg | ORAL_TABLET | Freq: Two times a day (BID) | ORAL | Status: DC
  Administered 2013-01-19 – 2013-01-20 (×3): 40 mg via ORAL
  Filled 2013-01-19 (×5): qty 1

## 2013-01-19 NOTE — Progress Notes (Addendum)
     Subjective:  Pleasant, no CP, no fevers, no cough. AFIB rate improved.   Objective:  Vital Signs in the last 24 hours: Temp:  [97.3 F (36.3 C)-98 F (36.7 C)] 97.4 F (36.3 C) (11/13 0541) Pulse Rate:  [75-109] 96 (11/13 1010) Resp:  [18] 18 (11/13 1010) BP: (112-140)/(46-73) 112/69 mmHg (11/13 1010) SpO2:  [94 %-96 %] 95 % (11/13 1010) FiO2 (%):  [21 %] 21 % (11/12 2034) Weight:  [175 lb 4.8 oz (79.516 kg)] 175 lb 4.8 oz (79.516 kg) (11/13 0541)  Intake/Output from previous day: 11/12 0701 - 11/13 0700 In: 700 [P.O.:700] Out: 1050 [Urine:1050]   Physical Exam: General: Weak appearing , in no acute distress. Head:  Normocephalic and atraumatic. Lungs: Clear to auscultation and percussion. Heart: Irreg irreg normal rate, 2/6 S murmur RUSB, rubs or gallops.  Abdomen: soft, non-tender, positive bowel sounds. Extremities: No clubbing or cyanosis. No edema. Neurologic: Alert and oriented x 3.    Lab Results:  Recent Labs  01/18/13 0525  WBC 12.7*  HGB 12.1  PLT 220    Recent Labs  01/17/13 0552 01/18/13 0525  NA  --  139  K 4.2 3.6  CL  --  90*  CO2  --  32  GLUCOSE  --  338*  BUN  --  77*  CREATININE  --  2.88*    Telemetry: HR up to 140 with bath. Now 110-120.  Personally viewed.   Cardiac Studies:  Normal EF  Assessment/Plan:  Principal Problem:   Acute on chronic renal failure Active Problems:   Peripheral vascular disease, unspecified   COPD (chronic obstructive pulmonary disease) with emphysema   Thyroid disease   Anemia   HTN (hypertension)   Chronic diastolic heart failure   Acute respiratory failure with hypoxia   COPD with acute exacerbation   Acute on chronic diastolic heart failure   Chronic atrial fibrillation  1) AFIB  - improved rate control with increased diltiazem CD 240mg  QD to her metoprolol 100mg  BID as well as amiodarone 200mg  BID (begun 01/18/13).  - Avoid digoxin. Renal.   - Would continue with amiodarone 200mg  PO  BID load over the next 2 Mangel then reduce to 200mg  PO QD.   2) Anticoagulation  - Warfarin (off NOAC, renal)  3) Chronic/acute diastolic HF  - EF normal. Diuresis (limited by CKD 4).   - Appreciate renal discussion. Avoiding ARB/ACE-I)  - Lasix 40mg  BID  4) COPD  - primary team. Ambulate.   5) CKD stage 4  - Creat in 3 range. Renal following. Reviewed note.   Consider DC later today or tomorrow now that rate is improved.     Keren Alverio, [place] 01/19/2013, 10:29 AM

## 2013-01-19 NOTE — Progress Notes (Signed)
Physical Therapy Treatment Patient Details Name: Jackie Alvarez MRN: [id number] DOB: 21-Feb-1932 Today's Date: 01/19/2013 Time: [id number] PT Time Calculation (min): 28 min  PT Assessment / Plan / Recommendation  History of Present Illness Patient  a 79 y.o. female who presents to the ED after lab results this week revealed rapid worsening of her renal function She had been seen in her PCP's office 3 days ago and had labs done which revealed an increase in her BUN and Cr to 35/3.5 and she was told to stop her Lotensin Rx, and her Lasix Rx. She had a repeat check on Friday and the numbers were further elevated at 41/4.2 and she was referred to the ED for evalauation.    PT Comments   Patient progressing toward her goals. Able to ambulate with RW and ascend 3 steps in preparation for home. She continues to need assistance in descending stairs and will be safe to d/c home with support of family.  Follow Up Recommendations        Does the patient have the potential to tolerate intense rehabilitation     Barriers to Discharge        Equipment Recommendations       Recommendations for Other Services    Frequency     Progress towards PT Goals Progress towards PT goals: Progressing toward goals  Plan Current plan remains appropriate    Precautions / Restrictions Precautions Precautions: Fall Restrictions Weight Bearing Restrictions: No   Pertinent Vitals/Pain HR 103 at rest, 135 max with ambulation. Pt denies pain and reports minimal fatigue.    Mobility  Bed Mobility Bed Mobility: Rolling Right;Right Sidelying to Sit;Sitting - Scoot to [employer] of Bed Rolling Right: 6: Modified independent (Device/Increase time) Right Sidelying to Sit: 6: Modified independent (Device/Increase time) Sitting - Scoot to Edge of Bed: 6: Modified independent (Device/Increase time) Details for Bed Mobility Assistance: Cues to reach for EOB instead of rail in rolling to supine. Transfers Transfers: Sit to  Stand;Stand to Sit Sit to Stand: From bed;From toilet;6: Modified independent (Device/Increase time) Stand to Sit: To toilet;To chair/3-in-1;6: Modified independent (Device/Increase time) Ambulation/Gait Ambulation/Gait Assistance: 5: Supervision Ambulation Distance (Feet): 300 Feet Assistive device: Rolling walker Ambulation/Gait Assistance Details: Min guard for amb with RW. Needs cues to stay inside RW. Minimal lean to the right. Gait Pattern: Step-through pattern;Decreased stride length Gait velocity: decreased; 30'/50sec=.68ft/sec placing pt at high fall risk General Gait Details: HR 103 at rest; max 135 with ambulation; pt c/o minimal fatigue Stairs: Yes Stairs Assistance: 4: Min assist Stairs Assistance Details (indicate cue type and reason): Pt min guard ascending with step-to pattern using Left UE on wall for support; Partial LOB on descent with min assist for recovery followed by hand held descent of last 2 steps Stair Management Technique: Step to pattern (Used wall on left ascend; on right and hand held descend) Number of Stairs: 3    Exercises General Exercises - Lower Extremity Long Arc Quad: AROM;Both;10 reps;Seated Hip ABduction/ADduction: AROM;Both;10 reps;Seated Hip Flexion/Marching: AROM;Both;10 reps;Seated Toe Raises: 15 reps;Seated;Both;AROM Heel Raises: AROM;Both;15 reps;Seated   PT Diagnosis:    PT Problem List:   PT Treatment Interventions:     PT Goals (current goals can now be found in the care plan section) Acute Rehab PT Goals Patient Stated Goal: To return home PT Goal Formulation: With patient Time For Goal Achievement: 01/26/13 Potential to Achieve Goals: Good  Visit Information  Last PT Received On: 01/19/13 Assistance Needed: +1 History of Present Illness:  Patient  a 79 y.o. female who presents to the ED after lab results this week revealed rapid worsening of her renal function She had been seen in her PCP's office 3 days ago and had labs done  which revealed an increase in her BUN and Cr to 35/3.5 and she was told to stop her Lotensin Rx, and her Lasix Rx. She had a repeat check on Friday and the numbers were further elevated at 41/4.2 and she was referred to the ED for evalauation.     Subjective Data  Patient Stated Goal: To return home   Cognition  Cognition Arousal/Alertness: Awake/alert Behavior During Therapy: WFL for tasks assessed/performed Overall Cognitive Status: Within Functional Limits for tasks assessed    Balance     End of Session PT - End of Session Equipment Utilized During Treatment: Gait belt Activity Tolerance: Patient tolerated treatment well Patient left: in chair;with call bell/phone within reach   GP     Anastasia Fiedler, SPT  01/19/2013, 3:35 PM

## 2013-01-19 NOTE — Progress Notes (Signed)
PHARMACY FOLLOW UP NOTE   Pharmacy Consult for : Coumadin  Indication: atrial fibrillation  Dosing Weight: 83 kg  Labs:  Recent Labs  01/17/13 0552 01/18/13 0525 01/19/13 0520  HGB  --  12.1  --   HCT  --  35.9*  --   PLT  --  220  --   LABPROT 20.3* 26.8* 33.5*  INR 1.79* 2.58* 3.46*  CREATININE  --  2.88*  --    Lab Results  Component Value Date   INR 3.46* 01/19/2013   INR 2.58* 01/18/2013   INR 1.79* 01/17/2013   Estimated Creatinine Clearance: 17.2 ml/min (by C-G formula based on Cr of 2.88).  Pertinent Medications:  Scheduled:  . ipratropium  0.5 mg Nebulization BID   And  . albuterol  2.5 mg Nebulization BID  . amiodarone  200 mg Oral BID  . budesonide (PULMICORT) nebulizer solution  0.5 mg Nebulization BID  . diltiazem  240 mg Oral Daily  . ferrous sulfate  325 mg Oral TID WC  . furosemide  40 mg Oral BID  . insulin aspart  0-20 Units Subcutaneous TID WC  . insulin aspart  0-5 Units Subcutaneous QHS  . insulin aspart  4 Units Subcutaneous TID WC  . levothyroxine  50 mcg Oral QAC breakfast  . metoprolol tartrate  100 mg Oral BID  . mirtazapine  15 mg Oral Daily  . omega-3 acid ethyl esters  1 g Oral BID  . Warfarin - Pharmacist Dosing Inpatient   Does not apply q1800   Assessment:  Patient continues on Coumadin for history atrial fibrillation.  INR supratherapeutic today with fairly large increase the past 2 days  She has no noted bleeding complications and her CBC has been stable. Also started on amiodarone which can increase sensitivity to coumadin  Still with issues regarding rate control with her Afib -  Goal:  INR 2-3   Plan: 1. Hold coumadin today 2. Daily INR's, CBC - weekly.  Monitor for bleeding complications.    Excell Seltzer, PharmD Clinical Pharmacist Thank you for allowing pharmacy to be part of this patients care team. 01/19/2013, 8:27 AM

## 2013-01-19 NOTE — Progress Notes (Signed)
Seen and agree with SPT note Chantee Cerino Tabor Ganon Demasi, PT [phone]  

## 2013-01-19 NOTE — Progress Notes (Signed)
Patient's peripheral IV flushed without difficulty and is patent. Time for IV to be changed but patient states does not want to be stuck again. No redness, infiltration, drainage, or swelling noted. Dressing is clean, without drainage and is intact. Will continue to monitor peripheral IV to end of shift.

## 2013-01-19 NOTE — Progress Notes (Signed)
[facility] PROGRESS NOTE Interim History: 79 y.o. female who presents to the ED after lab results this week revealed rapid worsening of her renal function She had been seen in her PCP's office 3 days ago and had labs done which revealed an increase in her BUN and Cr to 35/3.5 and she was told to stop her Lotensin Rx, and her Lasix Rx. She had a repeat check on Friday and the numbers were further elevated at 41/4.2 and she was referred to the ED for evalauation. Her Creatinine 1 month ago had been 1.6. She denies having any vomiting or diarrhea, but she reports having a loss of appetite due to a "bad taste" in her mouth for the past week.   Assessment/Plan: Acute respiratory failure with hypoxia due to  Acute on chronic diastolic heart failure and COPD with acute exacerbation: - BNP elevated, CXR showed pulmonary edema. - Hold  40mg  BID orally.   Atrial fibrillation with RVR: - Patient still with elevated heart rates despite titration of BB and CCB.  - Heart rate 110 at rest, saw 140-150s with ambulation.  - started on amiodarone 11.13.2014, INR therapeutic. - Now on Metop 100 BID, with HR in 110-130s still. - CHad2Vasc2 score= 5.  Acute on chronic renal failure: - Cr trending down. Baseline Cr..6-2.0. - possible due to lotensin and overdiuresis - Check orthostatics. - will d/w cards liberalize her fluids.  HTN (hypertension): - Bp stable.  Klebsiella UTI,  - Continue antibiotics day 5/5 of levofloxacin   Diabetes mellitus - HbA1c 5.7, FS high due to steroids, but tapering quickly.  - Continue SSI   Hypothyroidism  - continue levothyroxine 50 mcg every morning    Code Status: Full  Family Communication: none  Disposition Plan: inpatient, home when ready and heart rate improved. Currently sustained >100 bpm at rest  Consultants:  Nephrology  Cardiology  HPI/Subjective: No complains  Objective: Filed Vitals:   01/18/13 2034 01/18/13 2107 01/19/13 0541 01/19/13  0817  BP:  140/46 116/65   Pulse:  104 109   Temp:   97.4 F (36.3 C)   TempSrc:   Oral   Resp:   18   Height:      Weight:   79.516 kg (175 lb 4.8 oz)   SpO2: 96%  95% 94%    Intake/Output Summary (Last 24 hours) at 01/19/13 0852 Last data filed at 01/19/13 0824  Gross per 24 hour  Intake    940 ml  Output   1050 ml  Net   -110 ml   Filed Weights   01/17/13 0603 01/18/13 0530 01/19/13 0541  Weight: 79.9 kg (176 lb 2.4 oz) 78.835 kg (173 lb 12.8 oz) 79.516 kg (175 lb 4.8 oz)    Exam:  General: Alert, awake, oriented x3, in no acute distress.  HEENT: No bruits, no goiter.  Heart: Regular rate and rhythm, without murmurs, rubs, gallops.  Lungs: Good air movement, clear to auscultation.  Abdomen: Soft, nontender, nondistended, positive bowel sounds.  Neuro: Grossly intact, nonfocal.   Data Reviewed: Basic Metabolic Panel:  Recent Labs Lab 01/13/13 0530 01/14/13 0429 01/15/13 0417 01/16/13 0400 01/17/13 0552 01/18/13 0525  NA 142 140 141 140  --  139  K 4.2 4.0 3.7 3.2* 4.2 3.6  CL 102 96 94* 89*  --  90*  CO2 27 29 32 34*  --  32  GLUCOSE 119* 144* 162* 171*  --  338*  BUN 56* 68* 74* 78*  --  77*  CREATININE 3.20* 3.06* 3.04* 2.93*  --  2.88*  CALCIUM 9.4 9.4 9.9 10.1  --  9.9   Liver Function Tests: No results found for this basename: AST, ALT, ALKPHOS, BILITOT, PROT, ALBUMIN,  in the last 168 hours No results found for this basename: LIPASE, AMYLASE,  in the last 168 hours No results found for this basename: AMMONIA,  in the last 168 hours CBC:  Recent Labs Lab 01/18/13 0525  WBC 12.7*  HGB 12.1  HCT 35.9*  MCV 95.5  PLT 220   Cardiac Enzymes: No results found for this basename: CKTOTAL, CKMB, CKMBINDEX, TROPONINI,  in the last 168 hours BNP (last 3 results)  Recent Labs  01/10/13 0605  PROBNP 7146.0*   CBG:  Recent Labs Lab 01/18/13 1116 01/18/13 1242 01/18/13 1559 01/18/13 2051 01/19/13 0625  GLUCAP 65* 88 285* 249* 263*     No results found for this or any previous visit (from the past 240 hour(s)).   Studies: No results found.  Scheduled Meds: . ipratropium  0.5 mg Nebulization BID   And  . albuterol  2.5 mg Nebulization BID  . amiodarone  200 mg Oral BID  . budesonide (PULMICORT) nebulizer solution  0.5 mg Nebulization BID  . diltiazem  240 mg Oral Daily  . ferrous sulfate  325 mg Oral TID WC  . furosemide  40 mg Oral BID  . insulin aspart  0-20 Units Subcutaneous TID WC  . insulin aspart  0-5 Units Subcutaneous QHS  . insulin aspart  4 Units Subcutaneous TID WC  . levothyroxine  50 mcg Oral QAC breakfast  . metoprolol tartrate  100 mg Oral BID  . mirtazapine  15 mg Oral Daily  . omega-3 acid ethyl esters  1 g Oral BID  . Warfarin - Pharmacist Dosing Inpatient   Does not apply q1800   Continuous Infusions:    Charlynne Cousins  [facility] Pager [phone]. If 8PM-8AM, please contact night-coverage at [email or web address], password [facility] 01/19/2013, 8:52 AM  LOS: 13 days

## 2013-01-20 DIAGNOSIS — I4891 Unspecified atrial fibrillation: Secondary | ICD-10-CM | POA: Diagnosis present

## 2013-01-20 LAB — GLUCOSE, CAPILLARY

## 2013-01-20 MED ORDER — OMEGA-3-ACID ETHYL ESTERS 1 G PO CAPS: 1.0000 g | ORAL_CAPSULE | Freq: Two times a day (BID) | ORAL | Status: DC

## 2013-01-20 MED ORDER — AMIODARONE HCL 200 MG PO TABS: 200.0000 mg | ORAL_TABLET | Freq: Two times a day (BID) | ORAL | Status: DC

## 2013-01-20 MED ORDER — METOPROLOL TARTRATE 100 MG PO TABS: 100.0000 mg | ORAL_TABLET | Freq: Two times a day (BID) | ORAL | Status: DC

## 2013-01-20 MED ORDER — FUROSEMIDE 40 MG PO TABS: 40.0000 mg | ORAL_TABLET | Freq: Two times a day (BID) | ORAL | Status: DC

## 2013-01-20 MED ORDER — WARFARIN SODIUM 1 MG PO TABS
1.0000 mg | ORAL_TABLET | Freq: Once | ORAL | Status: DC
  Filled 2013-01-20: qty 1

## 2013-01-20 MED ORDER — AMIODARONE HCL 200 MG PO TABS: 200.0000 mg | ORAL_TABLET | Freq: Every day | ORAL | Status: DC

## 2013-01-20 MED ORDER — DILTIAZEM HCL ER COATED BEADS 240 MG PO CP24: 240.0000 mg | ORAL_CAPSULE | Freq: Every day | ORAL | Status: DC

## 2013-01-20 NOTE — Progress Notes (Signed)
Patient rested in bed in no acute distress throughout shift.  Denies pain.  Patient remains alert and oriented.  RN will continue to monitor. Shellee Milo, RN

## 2013-01-20 NOTE — Progress Notes (Signed)
PHARMACY FOLLOW UP NOTE   Pharmacy Consult for : Coumadin  Indication: atrial fibrillation  Dosing Weight: 83 kg  Labs:  Recent Labs  01/18/13 0525 01/19/13 0520 01/20/13 0407  HGB 12.1  --   --   HCT 35.9*  --   --   PLT 220  --   --   LABPROT 26.8* 33.5* 30.1*  INR 2.58* 3.46* 3.00*  CREATININE 2.88*  --   --    Lab Results  Component Value Date   INR 3.00* 01/20/2013   INR 3.46* 01/19/2013   INR 2.58* 01/18/2013   Estimated Creatinine Clearance: 17.2 ml/min (by C-G formula based on Cr of 2.88).  Pertinent Medications:  Scheduled:  . ipratropium  0.5 mg Nebulization BID   And  . albuterol  2.5 mg Nebulization BID  . amiodarone  200 mg Oral BID  . budesonide (PULMICORT) nebulizer solution  0.5 mg Nebulization BID  . diltiazem  240 mg Oral Daily  . ferrous sulfate  325 mg Oral TID WC  . furosemide  40 mg Oral BID  . insulin aspart  0-20 Units Subcutaneous TID WC  . insulin aspart  0-5 Units Subcutaneous QHS  . insulin aspart  4 Units Subcutaneous TID WC  . levothyroxine  50 mcg Oral QAC breakfast  . metoprolol tartrate  100 mg Oral BID  . mirtazapine  15 mg Oral Daily  . omega-3 acid ethyl esters  1 g Oral BID  . Warfarin - Pharmacist Dosing Inpatient   Does not apply q1800   Assessment:  Patient continues on Coumadin for history atrial fibrillation.  INR therapeutic today after holding dose yesterday.  She has no noted bleeding complications and her CBC has been stable. Started on amiodarone which can increase sensitivity to coumadin  Goal:  INR 2-3   Plan: 1. Coumadin 1 mg today 2. Daily INR's, CBC - weekly.  Monitor for bleeding complications.    Excell Seltzer, PharmD Clinical Pharmacist Thank you for allowing pharmacy to be part of this patients care team. 01/20/2013, 8:04 AM

## 2013-01-20 NOTE — Discharge Summary (Signed)
Physician Discharge Summary  Jackie Alvarez [id number] DOB: 02/26/1932 DOA: 01/06/2013  PCP: Maurice Small, D, MD  Admit date: 01/06/2013 Discharge date: 01/20/2013  Time spent: 40 minutes  Recommendations for Outpatient Follow-up:  1. Follow up with coumadin clinic in 1 week. 2. Follow up with cardiology in 1 week. To see if she rate controlled. 3. Follow up with PCP for HTn follow up   Discharge Diagnoses:  Principal Problem:   Atrial fibrillation with RVR Active Problems:   Acute on chronic renal failure   Peripheral vascular disease, unspecified   COPD (chronic obstructive pulmonary disease) with emphysema   Thyroid disease   Anemia   HTN (hypertension)   Chronic diastolic heart failure   Acute respiratory failure with hypoxia   COPD with acute exacerbation   Acute on chronic diastolic heart failure   Chronic atrial fibrillation   Discharge Condition: stable  Diet recommendation: heart healthy  Filed Weights   01/18/13 0530 01/19/13 0541 01/20/13 0514  Weight: 78.835 kg (173 lb 12.8 oz) 79.516 kg (175 lb 4.8 oz) 79.334 kg (174 lb 14.4 oz)    History of present illness:  79 y.o. female who presents to the ED after lab results this week revealed rapid worsening of her renal function She had been seen in her PCP's office 3 days ago and had labs done which revealed an increase in her BUN and Cr to 35/3.5 and she was told to stop her Lotensin Rx, and her Lasix Rx. She had a repeat check on Friday and the numbers were further elevated at 41/4.2 and she was referred to the ED for evalauation. Her Creatinine 1 month ago had been 1.6. She denies having any vomiting or diarrhea, but she reports having a loss of appetite due to a "bad taste" in her mouth for the past week. She has had weakness and fatigue as well as malaise. She denies having any fevers or chills.   Hospital Course:  Acute respiratory failure with hypoxia due to Acute on chronic diastolic heart failure - BNP  elevated, CXR showed pulmonary edema.  - she diureses well, lasix was increase to  40mg  BID orally.  - Will need a follow up with cardiologist in 1 week. - Metoprolol, lasix, not on a ACE- I due to renal function will need to be valuated in the future once Cr. Has stabilize.  Atrial fibrillation with RVR:  - was started on IV drip diltiazem and metoprolol orally. - Patient still with elevated heart rates despite titration of BB and CCB.  - started on amiodarone 11.13.2014, INR therapeutic.  - Now on Metop 100 BID + CCB and amiodarone, with HR in 11-90's. - CHad2Vasc2 score= 5.  - apixiban changed due to renal function. - started on coumadin.  Acute on chronic renal failure:  - Cr trending down. Baseline Cr..6-2.0.  - possible due to lotensin and overdiuresis   HTN (hypertension):  - Bp stable.   Klebsiella UTI,  - Continue antibiotics day 5/5 of levofloxacin   Diabetes mellitus   - HbA1c 5.7, FS high due to steroids, but tapered quickly.   Hypothyroidism  - continue levothyroxine 50 mcg every morning     Procedures:  Renal US  CXR  Consultations:  cardiology  Discharge Exam: Filed Vitals:   01/20/13 0514  BP: 133/74  Pulse: 90  Temp: 97.4 F (36.3 C)  Resp: 18    General: A&O x3 Cardiovascular: RRR Respiratory: good air movement CTA B/L  Discharge Instructions  Discharge Orders   Future Appointments Provider Department Dept Phone   02/08/2013 11:30 AM Sueanne Margarita, MD [facility] [phone]   03/29/2013 10:15 AM Kathee Delton, MD [facility] [phone]   04/11/2013 9:00 AM [facility] [place] [facility] [address] [phone]   04/11/2013 9:30 AM [facility] [place] [facility] [address] [phone]   04/11/2013 10:00 AM [facility] [place] [facility] [address] [phone]   04/11/2013 11:00 AM Sharmon Leyden Nickel, NP [facility] [phone]   Future Orders  Complete By Expires   Diet - low sodium heart healthy  As directed    Increase activity slowly  As directed        Medication List    STOP taking these medications       apixaban 2.5 MG Tabs tablet  Commonly known as:  ELIQUIS     doxazosin 2 MG tablet  Commonly known as:  CARDURA     metoprolol succinate 25 MG 24 hr tablet  Commonly known as:  TOPROL-XL      TAKE these medications       amiodarone 200 MG tablet  Commonly known as:  PACERONE  Take 1 tablet (200 mg total) by mouth 2 (two) times daily.     amiodarone 200 MG tablet  Commonly known as:  PACERONE  Take 1 tablet (200 mg total) by mouth daily.  Start taking on:  02/01/2013     amLODipine 10 MG tablet  Commonly known as:  NORVASC  Take 10 mg by mouth daily.     ANORO ELLIPTA 62.5-25 MCG/INH Aepb  Generic drug:  Umeclidinium-Vilanterol  Inhale 25 mcg into the lungs daily as needed (shortness of breath).     diltiazem 240 MG 24 hr capsule  Commonly known as:  CARDIZEM CD  Take 1 capsule (240 mg total) by mouth daily.     fish oil-omega-3 fatty acids 1000 MG capsule  Take 1 g by mouth 2 (two) times daily.     furosemide 40 MG tablet  Commonly known as:  LASIX  Take 1 tablet (40 mg total) by mouth 2 (two) times daily.     Iron 325 (65 FE) MG Tabs  Take 325 mg by mouth 3 (three) times daily.     levothyroxine 50 MCG tablet  Commonly known as:  SYNTHROID, LEVOTHROID  Take 50 mcg by mouth daily before breakfast.     metoprolol 100 MG tablet  Commonly known as:  LOPRESSOR  Take 1 tablet (100 mg total) by mouth 2 (two) times daily.     mirtazapine 15 MG tablet  Commonly known as:  REMERON  Take 15 mg by mouth every morning.     omega-3 acid ethyl esters 1 G capsule  Commonly known as:  LOVAZA  Take 1 capsule (1 g total) by mouth 2 (two) times daily.     omeprazole 20 MG capsule  Commonly known as:  PRILOSEC  Take 20 mg by mouth daily.     OSCAL 500/200 D-3 500-200 MG-UNIT per tablet  Generic  drug:  calcium-vitamin D  Take 1 tablet by mouth 2 (two) times daily.     pioglitazone 30 MG tablet  Commonly known as:  ACTOS  Take 30 mg by mouth daily.     simvastatin 20 MG tablet  Commonly known as:  ZOCOR  Take 20 mg by mouth at bedtime.     sitaGLIPtin 100 MG tablet  Commonly known as:  JANUVIA  Take 50 mg by mouth daily.       Allergies  Allergen Reactions  . Metformin And Related     Renal insuff  . Omnipaque [Iohexol] Nausea And Vomiting    unknown   Follow-up Information   Follow up with Sueanne Margarita, MD On 02/08/2013. (@11 :30 am )    Specialty:  Cardiology   Contact information:   [address] [place] [address] [phone]       Follow up Today. (Coumadin Clinic @2 :30pm )        The results of significant diagnostics from this hospitalization (including imaging, microbiology, ancillary and laboratory) are listed below for reference.    Significant Diagnostic Studies: US Renal  01/13/2013   CLINICAL DATA:  Acute renal failure.  EXAM: RENAL/URINARY TRACT ULTRASOUND COMPLETE  COMPARISON:  CT 03/29/2008.  FINDINGS: Right Kidney  Length: 10.4. Tiny cortical echogenicity in the right upper renal pole may represent a parenchymal calcification or cysts with echogenic debris. 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  Left Kidney  Length: 10.7 cm long axis. Suboptimal visualization. No gross hydronephrosis.  Bladder  Appears normal for degree of bladder distention.  Left pleural effusion noted.  IMPRESSION: Negative for hydronephrosis or stone disease. Tiny echogenic focus in the right upper renal pole cortex probably represents a small echogenic cyst or parenchymal calcification.   Electronically Signed   By: Dereck Ligas M.D.   On: 01/13/2013 16:00   Dg Chest Port 1 View  01/12/2013   CLINICAL DATA:  71-year- female with nonproductive cough and pleural effusion. Initial encounter.  EXAM: PORTABLE CHEST - 1 VIEW   COMPARISON:  01/10/2013 and earlier.  FINDINGS: Portable AP upright view at 1017 hr. Small to moderate right pleural effusion probably not significantly changed allowing for differences in patient positioning. Continued increased interstitial opacity. No pneumothorax. Persistent left retrocardiac opacity but no definite left effusion. Stable postoperative changes to the left axilla. Stable cardiac size and mediastinal contours.  IMPRESSION: Small to moderate right pleural effusion not significantly changed allowing for different patient positioning. Right greater than left lower lobe collapse or consolidation. Continued vascular congestion.   Electronically Signed   By: Lars Pinks M.D.   On: 01/12/2013 17:36   Dg Chest Port 1 View  01/10/2013   CLINICAL DATA:  Shortness of breath  EXAM: PORTABLE CHEST - 1 VIEW  COMPARISON:  11/14/2012  FINDINGS: Cardiac shadow is stable. A moderate size right-sided pleural effusion is noted increased from the prior exam. Some mild interstitial changes are seen which may be related to mild vascular congestion.  IMPRESSION: Moderate right-sided pleural effusion new from the prior exam.  Mild vascular congestion.   Electronically Signed   By: Inez Catalina M.D.   On: 01/10/2013 07:35    Microbiology: No results found for this or any previous visit (from the past 240 hour(s)).   Labs: Basic Metabolic Panel:  Recent Labs Lab 01/14/13 0429 01/15/13 0417 01/16/13 0400 01/17/13 0552 01/18/13 0525  NA 140 141 140  --  139  K 4.0 3.7 3.2* 4.2 3.6  CL 96 94* 89*  --  90*  CO2 29 32 34*  --  32  GLUCOSE 144* 162* 171*  --  338*  BUN 68* 74* 78*  --  77*  CREATININE 3.06* 3.04* 2.93*  --  2.88*  CALCIUM 9.4 9.9 10.1  --  9.9   Liver Function Tests:  No results found for this basename: AST, ALT, ALKPHOS, BILITOT, PROT, ALBUMIN,  in the last 168 hours No results found for this basename: LIPASE, AMYLASE,  in the last 168 hours No results found for this basename: AMMONIA,   in the last 168 hours CBC:  Recent Labs Lab 01/18/13 0525  WBC 12.7*  HGB 12.1  HCT 35.9*  MCV 95.5  PLT 220   Cardiac Enzymes: No results found for this basename: CKTOTAL, CKMB, CKMBINDEX, TROPONINI,  in the last 168 hours BNP: BNP (last 3 results)  Recent Labs  01/10/13 0605  PROBNP 7146.0*   CBG:  Recent Labs Lab 01/19/13 0625 01/19/13 1104 01/19/13 1609 01/19/13 2041 01/20/13 0548  GLUCAP 263* 254* 276* 121* 240*       Signed:  Charlynne Cousins  [facility] 01/20/2013, 8:37 AM

## 2013-01-20 NOTE — Progress Notes (Signed)
PT Cancellation Note  Patient Details Name: Jackie Alvarez MRN: [id number] DOB: April 19, 1931   Cancelled Treatment:    Reason Eval/Treat Not Completed: Patient declined, no reason specified.  Patient at EOB dressed ready for discharge.  Patient reports she is being discharged and has no questions or concerns related to physical therapy.  Patient declined PT services today.   Despina Pole 01/20/2013, 12:52 PM Carita Pian. Sanjuana Kava, [place] Pager [phone]

## 2013-01-20 NOTE — Progress Notes (Signed)
Jackie Alvarez to be D/C'd Home per MD order.  Discussed with the patient and all questions fully answered.    Medication List    STOP taking these medications       apixaban 2.5 MG Tabs tablet  Commonly known as:  ELIQUIS     doxazosin 2 MG tablet  Commonly known as:  CARDURA     metoprolol succinate 25 MG 24 hr tablet  Commonly known as:  TOPROL-XL      TAKE these medications       amiodarone 200 MG tablet  Commonly known as:  PACERONE  Take 1 tablet (200 mg total) by mouth 2 (two) times daily.     amiodarone 200 MG tablet  Commonly known as:  PACERONE  Take 1 tablet (200 mg total) by mouth daily.  Start taking on:  02/01/2013     amLODipine 10 MG tablet  Commonly known as:  NORVASC  Take 10 mg by mouth daily.     ANORO ELLIPTA 62.5-25 MCG/INH Aepb  Generic drug:  Umeclidinium-Vilanterol  Inhale 25 mcg into the lungs daily as needed (shortness of breath).     diltiazem 240 MG 24 hr capsule  Commonly known as:  CARDIZEM CD  Take 1 capsule (240 mg total) by mouth daily.     fish oil-omega-3 fatty acids 1000 MG capsule  Take 1 g by mouth 2 (two) times daily.     furosemide 40 MG tablet  Commonly known as:  LASIX  Take 1 tablet (40 mg total) by mouth 2 (two) times daily.     Iron 325 (65 FE) MG Tabs  Take 325 mg by mouth 3 (three) times daily.     levothyroxine 50 MCG tablet  Commonly known as:  SYNTHROID, LEVOTHROID  Take 50 mcg by mouth daily before breakfast.     metoprolol 100 MG tablet  Commonly known as:  LOPRESSOR  Take 1 tablet (100 mg total) by mouth 2 (two) times daily.     mirtazapine 15 MG tablet  Commonly known as:  REMERON  Take 15 mg by mouth every morning.     omega-3 acid ethyl esters 1 G capsule  Commonly known as:  LOVAZA  Take 1 capsule (1 g total) by mouth 2 (two) times daily.     omeprazole 20 MG capsule  Commonly known as:  PRILOSEC  Take 20 mg by mouth daily.     OSCAL 500/200 D-3 500-200 MG-UNIT per tablet  Generic drug:   calcium-vitamin D  Take 1 tablet by mouth 2 (two) times daily.     pioglitazone 30 MG tablet  Commonly known as:  ACTOS  Take 30 mg by mouth daily.     simvastatin 20 MG tablet  Commonly known as:  ZOCOR  Take 20 mg by mouth at bedtime.     sitaGLIPtin 100 MG tablet  Commonly known as:  JANUVIA  Take 50 mg by mouth daily.        VVS, Skin clean, dry and intact without evidence of skin break down, no evidence of skin tears noted. IV catheter discontinued intact. Site without signs and symptoms of complications. Dressing and pressure applied.  An After Visit Summary was printed and given to the patient. Patient escorted via [place], and D/C home via private auto.  Cresencio Reesor 01/20/2013 1:25 PM

## 2013-01-23 ENCOUNTER — Ambulatory Visit: Payer: Medicare Other | Admitting: Cardiology

## 2013-01-23 DIAGNOSIS — I4891 Unspecified atrial fibrillation: Secondary | ICD-10-CM | POA: Diagnosis not present

## 2013-01-25 DIAGNOSIS — E1129 Type 2 diabetes mellitus with other diabetic kidney complication: Secondary | ICD-10-CM | POA: Diagnosis not present

## 2013-01-25 DIAGNOSIS — I509 Heart failure, unspecified: Secondary | ICD-10-CM | POA: Diagnosis not present

## 2013-01-25 DIAGNOSIS — IMO0001 Reserved for inherently not codable concepts without codable children: Secondary | ICD-10-CM | POA: Diagnosis not present

## 2013-01-25 DIAGNOSIS — I5032 Chronic diastolic (congestive) heart failure: Secondary | ICD-10-CM | POA: Diagnosis not present

## 2013-01-25 DIAGNOSIS — I4891 Unspecified atrial fibrillation: Secondary | ICD-10-CM | POA: Diagnosis not present

## 2013-01-25 DIAGNOSIS — Z87440 Personal history of urinary (tract) infections: Secondary | ICD-10-CM | POA: Diagnosis not present

## 2013-01-25 DIAGNOSIS — J441 Chronic obstructive pulmonary disease with (acute) exacerbation: Secondary | ICD-10-CM | POA: Diagnosis not present

## 2013-01-25 DIAGNOSIS — I2789 Other specified pulmonary heart diseases: Secondary | ICD-10-CM | POA: Diagnosis not present

## 2013-01-26 DIAGNOSIS — R7309 Other abnormal glucose: Secondary | ICD-10-CM | POA: Diagnosis not present

## 2013-01-26 DIAGNOSIS — I4891 Unspecified atrial fibrillation: Secondary | ICD-10-CM | POA: Diagnosis not present

## 2013-01-26 DIAGNOSIS — N179 Acute kidney failure, unspecified: Secondary | ICD-10-CM | POA: Diagnosis not present

## 2013-01-26 DIAGNOSIS — E1129 Type 2 diabetes mellitus with other diabetic kidney complication: Secondary | ICD-10-CM | POA: Diagnosis not present

## 2013-01-26 DIAGNOSIS — N189 Chronic kidney disease, unspecified: Secondary | ICD-10-CM | POA: Diagnosis not present

## 2013-01-27 ENCOUNTER — Encounter (HOSPITAL_COMMUNITY): Payer: Self-pay | Admitting: Emergency Medicine

## 2013-01-27 ENCOUNTER — Inpatient Hospital Stay (HOSPITAL_COMMUNITY)
Admission: EM | Admit: 2013-01-27 | Discharge: 2013-01-30 | DRG: 683 | Disposition: A | Discharge status: Home / Self Care | Payer: Medicare Other | Attending: Internal Medicine | Admitting: Internal Medicine

## 2013-01-27 DIAGNOSIS — E039 Hypothyroidism, unspecified: Secondary | ICD-10-CM | POA: Diagnosis present

## 2013-01-27 DIAGNOSIS — I10 Essential (primary) hypertension: Secondary | ICD-10-CM

## 2013-01-27 DIAGNOSIS — J439 Emphysema, unspecified: Secondary | ICD-10-CM

## 2013-01-27 DIAGNOSIS — E669 Obesity, unspecified: Secondary | ICD-10-CM | POA: Diagnosis present

## 2013-01-27 DIAGNOSIS — I129 Hypertensive chronic kidney disease with stage 1 through stage 4 chronic kidney disease, or unspecified chronic kidney disease: Secondary | ICD-10-CM | POA: Diagnosis present

## 2013-01-27 DIAGNOSIS — I739 Peripheral vascular disease, unspecified: Secondary | ICD-10-CM | POA: Diagnosis present

## 2013-01-27 DIAGNOSIS — I480 Paroxysmal atrial fibrillation: Secondary | ICD-10-CM

## 2013-01-27 DIAGNOSIS — Z87891 Personal history of nicotine dependence: Secondary | ICD-10-CM

## 2013-01-27 DIAGNOSIS — D649 Anemia, unspecified: Secondary | ICD-10-CM

## 2013-01-27 DIAGNOSIS — E079 Disorder of thyroid, unspecified: Secondary | ICD-10-CM

## 2013-01-27 DIAGNOSIS — J90 Pleural effusion, not elsewhere classified: Secondary | ICD-10-CM

## 2013-01-27 DIAGNOSIS — I4891 Unspecified atrial fibrillation: Secondary | ICD-10-CM

## 2013-01-27 DIAGNOSIS — R5383 Other fatigue: Secondary | ICD-10-CM

## 2013-01-27 DIAGNOSIS — E785 Hyperlipidemia, unspecified: Secondary | ICD-10-CM | POA: Diagnosis present

## 2013-01-27 DIAGNOSIS — W010XXA Fall on same level from slipping, tripping and stumbling without subsequent striking against object, initial encounter: Secondary | ICD-10-CM | POA: Diagnosis present

## 2013-01-27 DIAGNOSIS — I498 Other specified cardiac arrhythmias: Secondary | ICD-10-CM | POA: Diagnosis present

## 2013-01-27 DIAGNOSIS — I359 Nonrheumatic aortic valve disorder, unspecified: Secondary | ICD-10-CM | POA: Diagnosis present

## 2013-01-27 DIAGNOSIS — I509 Heart failure, unspecified: Secondary | ICD-10-CM | POA: Diagnosis present

## 2013-01-27 DIAGNOSIS — Z6828 Body mass index (BMI) 28.0-28.9, adult: Secondary | ICD-10-CM

## 2013-01-27 DIAGNOSIS — R5381 Other malaise: Secondary | ICD-10-CM | POA: Diagnosis present

## 2013-01-27 DIAGNOSIS — N184 Chronic kidney disease, stage 4 (severe): Secondary | ICD-10-CM | POA: Diagnosis present

## 2013-01-27 DIAGNOSIS — N179 Acute kidney failure, unspecified: Principal | ICD-10-CM

## 2013-01-27 DIAGNOSIS — I5033 Acute on chronic diastolic (congestive) heart failure: Secondary | ICD-10-CM

## 2013-01-27 DIAGNOSIS — T148XXA Other injury of unspecified body region, initial encounter: Secondary | ICD-10-CM

## 2013-01-27 DIAGNOSIS — R9431 Abnormal electrocardiogram [ECG] [EKG]: Secondary | ICD-10-CM

## 2013-01-27 DIAGNOSIS — I2789 Other specified pulmonary heart diseases: Secondary | ICD-10-CM | POA: Diagnosis present

## 2013-01-27 DIAGNOSIS — I350 Nonrheumatic aortic (valve) stenosis: Secondary | ICD-10-CM

## 2013-01-27 DIAGNOSIS — M7989 Other specified soft tissue disorders: Secondary | ICD-10-CM

## 2013-01-27 DIAGNOSIS — Z7901 Long term (current) use of anticoagulants: Secondary | ICD-10-CM | POA: Diagnosis not present

## 2013-01-27 DIAGNOSIS — J441 Chronic obstructive pulmonary disease with (acute) exacerbation: Secondary | ICD-10-CM

## 2013-01-27 DIAGNOSIS — I5032 Chronic diastolic (congestive) heart failure: Secondary | ICD-10-CM | POA: Diagnosis not present

## 2013-01-27 DIAGNOSIS — I4892 Unspecified atrial flutter: Secondary | ICD-10-CM | POA: Diagnosis present

## 2013-01-27 DIAGNOSIS — R600 Localized edema: Secondary | ICD-10-CM

## 2013-01-27 DIAGNOSIS — N19 Unspecified kidney failure: Secondary | ICD-10-CM | POA: Diagnosis not present

## 2013-01-27 DIAGNOSIS — E119 Type 2 diabetes mellitus without complications: Secondary | ICD-10-CM | POA: Diagnosis present

## 2013-01-27 DIAGNOSIS — Z9229 Personal history of other drug therapy: Secondary | ICD-10-CM

## 2013-01-27 DIAGNOSIS — I482 Chronic atrial fibrillation, unspecified: Secondary | ICD-10-CM | POA: Diagnosis present

## 2013-01-27 DIAGNOSIS — I4819 Other persistent atrial fibrillation: Secondary | ICD-10-CM | POA: Diagnosis present

## 2013-01-27 DIAGNOSIS — J438 Other emphysema: Secondary | ICD-10-CM | POA: Diagnosis present

## 2013-01-27 DIAGNOSIS — I272 Pulmonary hypertension, unspecified: Secondary | ICD-10-CM

## 2013-01-27 DIAGNOSIS — J9601 Acute respiratory failure with hypoxia: Secondary | ICD-10-CM

## 2013-01-27 DIAGNOSIS — K219 Gastro-esophageal reflux disease without esophagitis: Secondary | ICD-10-CM | POA: Diagnosis present

## 2013-01-27 DIAGNOSIS — N189 Chronic kidney disease, unspecified: Secondary | ICD-10-CM | POA: Diagnosis not present

## 2013-01-27 DIAGNOSIS — E876 Hypokalemia: Secondary | ICD-10-CM | POA: Diagnosis present

## 2013-01-27 DIAGNOSIS — R918 Other nonspecific abnormal finding of lung field: Secondary | ICD-10-CM

## 2013-01-27 DIAGNOSIS — I70219 Atherosclerosis of native arteries of extremities with intermittent claudication, unspecified extremity: Secondary | ICD-10-CM

## 2013-01-27 DIAGNOSIS — R001 Bradycardia, unspecified: Secondary | ICD-10-CM

## 2013-01-27 LAB — CBC WITH DIFFERENTIAL/PLATELET
Basophils Relative: 0 % (ref 0–1)
Eosinophils Absolute: 0.2 10*3/uL (ref 0.0–0.7)
Eosinophils Relative: 1 % (ref 0–5)
HCT: 36.7 % (ref 36.0–46.0)
Lymphocytes Relative: 15 % (ref 12–46)
Lymphs Abs: 2.2 10*3/uL (ref 0.7–4.0)
MCH: 32.2 pg (ref 26.0–34.0)
MCHC: 34.3 g/dL (ref 30.0–36.0)
MCV: 93.9 fL (ref 78.0–100.0)
Monocytes Relative: 7 % (ref 3–12)
Neutro Abs: 11 10*3/uL — ABNORMAL HIGH (ref 1.7–7.7)
Neutrophils Relative %: 76 % (ref 43–77)
Platelets: 172 10*3/uL (ref 150–400)
RBC: 3.91 MIL/uL (ref 3.87–5.11)

## 2013-01-27 LAB — POCT I-STAT, CHEM 8
BUN: 68 mg/dL — ABNORMAL HIGH (ref 6–23)
Chloride: 93 mEq/L — ABNORMAL LOW (ref 96–112)
HCT: 41 % (ref 36.0–46.0)
Potassium: 3.7 mEq/L (ref 3.5–5.1)

## 2013-01-27 LAB — COMPREHENSIVE METABOLIC PANEL
AST: 21 U/L (ref 0–37)
Albumin: 3.7 g/dL (ref 3.5–5.2)
Alkaline Phosphatase: 93 U/L (ref 39–117)
BUN: 67 mg/dL — ABNORMAL HIGH (ref 6–23)
CO2: 26 mEq/L (ref 19–32)
Calcium: 9.6 mg/dL (ref 8.4–10.5)
GFR calc Af Amer: 12 mL/min — ABNORMAL LOW (ref >90)
Glucose, Bld: 416 mg/dL — ABNORMAL HIGH (ref 70–99)
Potassium: 4 mEq/L (ref 3.5–5.1)
Sodium: 133 mEq/L — ABNORMAL LOW (ref 135–145)
Total Protein: 7.1 g/dL (ref 6.0–8.3)

## 2013-01-27 LAB — PROTIME-INR
INR: 2.02 — ABNORMAL HIGH (ref 0.00–1.49)
Prothrombin Time: 22.2 seconds — ABNORMAL HIGH (ref 11.6–15.2)

## 2013-01-27 LAB — GLUCOSE, CAPILLARY
Glucose-Capillary: 374 mg/dL — ABNORMAL HIGH (ref 70–99)
Glucose-Capillary: 325 mg/dL — ABNORMAL HIGH (ref 70–99)

## 2013-01-27 MED ORDER — WARFARIN SODIUM 2.5 MG PO TABS
2.5000 mg | ORAL_TABLET | Freq: Every day | ORAL | Status: DC
  Administered 2013-01-28 (×2): 2.5 mg via ORAL
  Filled 2013-01-27 (×3): qty 1

## 2013-01-27 MED ORDER — METOPROLOL TARTRATE 50 MG PO TABS
75.0000 mg | ORAL_TABLET | Freq: Two times a day (BID) | ORAL | Status: DC
  Filled 2013-01-27 (×3): qty 1

## 2013-01-27 MED ORDER — LEVOTHYROXINE SODIUM 50 MCG PO TABS
50.0000 ug | ORAL_TABLET | Freq: Every day | ORAL | Status: DC
  Administered 2013-01-28 – 2013-01-30 (×3): 50 ug via ORAL
  Filled 2013-01-27 (×4): qty 1

## 2013-01-27 MED ORDER — SODIUM CHLORIDE 0.9 % IV SOLN
INTRAVENOUS | Status: AC
  Administered 2013-01-28: 50 mL/h via INTRAVENOUS

## 2013-01-27 MED ORDER — ONDANSETRON HCL 4 MG/2ML IJ SOLN: 4.0000 mg | Freq: Four times a day (QID) | INTRAMUSCULAR | Status: DC | PRN

## 2013-01-27 MED ORDER — PANTOPRAZOLE SODIUM 40 MG PO TBEC
40.0000 mg | DELAYED_RELEASE_TABLET | Freq: Every day | ORAL | Status: DC
  Administered 2013-01-28 – 2013-01-30 (×3): 40 mg via ORAL
  Filled 2013-01-27 (×4): qty 1

## 2013-01-27 MED ORDER — SODIUM CHLORIDE 0.9 % IV BOLUS (SEPSIS)
500.0000 mL | Freq: Once | INTRAVENOUS | Status: AC
  Administered 2013-01-27: 500 mL via INTRAVENOUS

## 2013-01-27 MED ORDER — SODIUM CHLORIDE 0.9 % IJ SOLN
3.0000 mL | Freq: Two times a day (BID) | INTRAMUSCULAR | Status: DC
  Administered 2013-01-28 – 2013-01-29 (×5): 3 mL via INTRAVENOUS

## 2013-01-27 MED ORDER — SIMVASTATIN 20 MG PO TABS
20.0000 mg | ORAL_TABLET | Freq: Every day | ORAL | Status: DC
  Administered 2013-01-28 – 2013-01-29 (×3): 20 mg via ORAL
  Filled 2013-01-27 (×4): qty 1

## 2013-01-27 MED ORDER — MIRTAZAPINE 15 MG PO TABS
15.0000 mg | ORAL_TABLET | ORAL | Status: DC
  Administered 2013-01-28 – 2013-01-30 (×3): 15 mg via ORAL
  Filled 2013-01-27 (×4): qty 1

## 2013-01-27 MED ORDER — AMIODARONE HCL 200 MG PO TABS
200.0000 mg | ORAL_TABLET | Freq: Every day | ORAL | Status: DC
  Filled 2013-01-27: qty 1

## 2013-01-27 MED ORDER — INSULIN ASPART 100 UNIT/ML ~~LOC~~ SOLN
0.0000 [IU] | Freq: Every day | SUBCUTANEOUS | Status: DC
  Administered 2013-01-27: 4 [IU] via SUBCUTANEOUS

## 2013-01-27 MED ORDER — WARFARIN - PHARMACIST DOSING INPATIENT
Freq: Every day | Status: DC
  Administered 2013-01-28 – 2013-01-29 (×2)

## 2013-01-27 MED ORDER — INSULIN ASPART 100 UNIT/ML ~~LOC~~ SOLN
0.0000 [IU] | Freq: Three times a day (TID) | SUBCUTANEOUS | Status: DC
  Administered 2013-01-28 – 2013-01-29 (×5): 5 [IU] via SUBCUTANEOUS
  Administered 2013-01-29: 3 [IU] via SUBCUTANEOUS
  Administered 2013-01-30: 8 [IU] via SUBCUTANEOUS
  Administered 2013-01-30: 3 [IU] via SUBCUTANEOUS

## 2013-01-27 MED ORDER — AMLODIPINE BESYLATE 10 MG PO TABS
10.0000 mg | ORAL_TABLET | Freq: Every day | ORAL | Status: DC
  Administered 2013-01-28 – 2013-01-30 (×3): 10 mg via ORAL
  Filled 2013-01-27 (×3): qty 1

## 2013-01-27 MED ORDER — ONDANSETRON HCL 4 MG PO TABS: 4.0000 mg | ORAL_TABLET | Freq: Four times a day (QID) | ORAL | Status: DC | PRN

## 2013-01-27 MED ORDER — SODIUM CHLORIDE 0.9 % IV BOLUS (SEPSIS): 1000.0000 mL | Freq: Once | INTRAVENOUS | Status: DC

## 2013-01-27 MED ORDER — DILTIAZEM HCL ER COATED BEADS 240 MG PO CP24
240.0000 mg | ORAL_CAPSULE | Freq: Every day | ORAL | Status: DC
  Filled 2013-01-27: qty 1

## 2013-01-27 NOTE — ED Notes (Signed)
Patient received phone call from PCP stating her blood sugar was high and her renal functions were bad.

## 2013-01-27 NOTE — Progress Notes (Signed)
ANTICOAGULATION CONSULT NOTE - Initial Consult  Pharmacy Consult for Coumadin Indication: atrial fibrillation  Allergies  Allergen Reactions  . Metformin And Related     Renal insuff  . Omnipaque [Iohexol] Nausea And Vomiting    unknown    Patient Measurements: Height: 5\' 7"  (170.2 cm) Weight: 177 lb 1.6 oz (80.332 kg) IBW/kg (Calculated) : 61.6  Vital Signs: Temp: 97.7 F (36.5 C) (11/21 2306) Temp src: Oral (11/21 2306) BP: 151/66 mmHg (11/21 2306) Pulse Rate: 51 (11/21 2306)  Labs:  Recent Labs  01/27/13 1913 01/27/13 1947 01/27/13 2059  HGB 12.6 13.9  --   HCT 36.7 41.0  --   PLT 172  --   --   LABPROT  --   --  22.2*  INR  --   --  2.02*  CREATININE 3.82* 4.20*  --     Estimated Creatinine Clearance: 11.8 ml/min (by C-G formula based on Cr of 4.2).   Medical History: Past Medical History  Diagnosis Date  . Diabetes mellitus   . Hypertension   . Leg pain   . Peripheral vascular disease   . Hyperlipidemia   . Hemorrhoids   . Ulcer 2009    gatric with history of bleeding - resolved  . COPD (chronic obstructive pulmonary disease)   . Cancer 1994    Left Breast  . Carotid artery occlusion     mild bilateral  . Anxiety   . Central retinal vein occlusion, right eye   . Anemia   . Chronic kidney disease     CKD stage III  . Atrial flutter 2008    remote  . Thyroid disease     toxic multinodular goiter with history of thyrotoxicosis now s/p RAI ablative therapy - now hypothyroid  . Depression   . Peripheral neuropathy   . GERD (gastroesophageal reflux disease)   . Obesity   . Hiatal hernia   . Schatzki's ring   . Cholelithiases   . Anorexia nervosa   . Chronic diastolic CHF (congestive heart failure)   . Aortic stenosis, mild     echo 12/2012  . Pulmonary HTN     mild PASP 24mmHg by echo 12/2012    Medications:  Prescriptions prior to admission  Medication Sig Dispense Refill  . [START ON 02/01/2013] amiodarone (PACERONE) 200 MG tablet  Take 1 tablet (200 mg total) by mouth daily.  30 tablet  0  . amLODipine (NORVASC) 10 MG tablet Take 10 mg by mouth daily.        . calcium-vitamin D (OSCAL 500/200 D-3) 500-200 MG-UNIT per tablet Take 1 tablet by mouth 2 (two) times daily.        Marland Kitchen diltiazem (CARDIZEM CD) 240 MG 24 hr capsule Take 1 capsule (240 mg total) by mouth daily.  30 capsule  0  . Ferrous Sulfate (IRON) 325 (65 FE) MG TABS Take 325 mg by mouth 3 (three) times daily.       . fish oil-omega-3 fatty acids 1000 MG capsule Take 1 g by mouth 2 (two) times daily.        . furosemide (LASIX) 40 MG tablet Take 1 tablet (40 mg total) by mouth 2 (two) times daily.  30 tablet  0  . levothyroxine (SYNTHROID, LEVOTHROID) 50 MCG tablet Take 50 mcg by mouth daily before breakfast.      . metoprolol (LOPRESSOR) 100 MG tablet Take 1 tablet (100 mg total) by mouth 2 (two) times daily.  30 tablet  0  . mirtazapine (REMERON) 15 MG tablet Take 15 mg by mouth every morning.       Marland Kitchen omega-3 acid ethyl esters (LOVAZA) 1 G capsule Take 1 capsule (1 g total) by mouth 2 (two) times daily.  60 capsule  0  . omeprazole (PRILOSEC) 20 MG capsule Take 20 mg by mouth daily.        . pioglitazone (ACTOS) 30 MG tablet Take 30 mg by mouth daily.      . simvastatin (ZOCOR) 20 MG tablet Take 20 mg by mouth at bedtime.        . sitaGLIPtin (JANUVIA) 100 MG tablet Take 50 mg by mouth daily.        Marland Kitchen Umeclidinium-Vilanterol (ANORO ELLIPTA) 62.5-25 MCG/INH AEPB Inhale 25 mcg into the lungs daily as needed (shortness of breath).      . warfarin (COUMADIN) 2.5 MG tablet Take 2.5 mg by mouth as directed.      Marland Kitchen amiodarone (PACERONE) 200 MG tablet Take 1 tablet (200 mg total) by mouth 2 (two) times daily.  24 tablet  0   Scheduled:  . [START ON 01/28/2013] amiodarone  200 mg Oral Daily  . [START ON 01/28/2013] amLODipine  10 mg Oral Daily  . [START ON 01/28/2013] diltiazem  240 mg Oral Daily  . [START ON 01/28/2013] insulin aspart  0-15 Units Subcutaneous TID WC   . insulin aspart  0-5 Units Subcutaneous QHS  . [START ON 01/28/2013] levothyroxine  50 mcg Oral QAC breakfast  . metoprolol  75 mg Oral BID  . [START ON 01/28/2013] mirtazapine  15 mg Oral BH-q7a  . [START ON 01/28/2013] pantoprazole  40 mg Oral Daily  . simvastatin  20 mg Oral QHS  . sodium chloride  3 mL Intravenous Q12H  . warfarin  2.5 mg Oral q1800  . [START ON 01/28/2013] Warfarin - Pharmacist Dosing Inpatient   Does not apply q1800   Infusions:  . sodium chloride      Assessment: 79yo female presents to ED from home after call from PCP stating blood sugar and renal labs were "bad", had recent admission for bradycardia, to continue Coumadin during admission for further workup; admitted with therapeutic INR.  Goal of Therapy:  INR 2-3   Plan:  Will continue home Coumadin dose of 2.5mg  daily and monitor INR.  Wynona Neat, PharmD, BCPS  01/27/2013,11:23 PM

## 2013-01-27 NOTE — ED Notes (Signed)
Per patient, recently admitted for about 2 [place] with bradycardia. Was seeing PCP today for check-up. Routine labs and examination showed bradycardia and hyperglycemia. Patient was advised to come to hospital for further evaluation.

## 2013-01-27 NOTE — ED Notes (Signed)
Attempted to draw labs was unsuccessful called lab

## 2013-01-27 NOTE — ED Notes (Signed)
MD at bedside. 

## 2013-01-27 NOTE — H&P (Signed)
[facility] History and Physical  Patient: Jackie Alvarez  [id number]  DOB: [birth year]  DOS: the patient was seen and examined on 01/27/2013 PCP: Maurice Small, D, MD  Chief Complaint: Abnormal lab  HPI: Jackie Alvarez is a 79 y.o. female with Past medical history of diabetes mellitus, hypertension, peptic ulcer disease, COPD, recent admission for diastolic dysfunction, A. fib with RVR, recently started on Coumadin. The patient is coming from home. The patient presented with the complaint of abnormal lab and lethargy. She mentions that she went to see her PCP today as routine followup and they did a routine lab which showed acute worsening of her renal function and therefore she was recommended to come to the ED. Patient mentions that ever since she has been discharged from the hospital she has been having generalized weakness and lethargy which is worsening.  Pt denies any fever, chills, headache, cough, chest pain, palpitation, shortness of breath, orthopnea, PND, nausea, vomiting, abdominal pain, diarrhea, constipation, active bleeding, burning urination, dizziness, pedal edema,  focal neurological deficit. She mentions that she is compliant with all medications Although initially I was told that she lives on her own but she lives with her son at present.  Review of Systems: as mentioned in the history of present illness.  A Comprehensive review of the other systems is negative.  Past Medical History  Diagnosis Date  . Diabetes mellitus   . Hypertension   . Leg pain   . Peripheral vascular disease   . Hyperlipidemia   . Hemorrhoids   . Ulcer 2009    gatric with history of bleeding - resolved  . COPD (chronic obstructive pulmonary disease)   . Cancer 1994    Left Breast  . Carotid artery occlusion     mild bilateral  . Anxiety   . Central retinal vein occlusion, right eye   . Anemia   . Chronic kidney disease     CKD stage III  . Atrial flutter 2008    remote  .  Thyroid disease     toxic multinodular goiter with history of thyrotoxicosis now s/p RAI ablative therapy - now hypothyroid  . Depression   . Peripheral neuropathy   . GERD (gastroesophageal reflux disease)   . Obesity   . Hiatal hernia   . Schatzki's ring   . Cholelithiases   . Anorexia nervosa   . Chronic diastolic CHF (congestive heart failure)   . Aortic stenosis, mild     echo 12/2012  . Pulmonary HTN     mild PASP 20mmHg by echo 12/2012   Past Surgical History  Procedure Laterality Date  . Wrist surgery  02/2009  . Cholecystectomy      Gall Bladder  . Pr vein bypass graft,aorto-fem-pop    . Abdominal hysterectomy      with BSO  . Carotid endarterectomy    . Breast lumpectomy    . Abdominal aortagram  Jan. 18, 2013  . Hip surgery Left    Social History:  reports that she quit smoking about 16 years ago. Her smoking use included Cigarettes. She has a 45 pack-year smoking history. She has never used smokeless tobacco. She reports that she does not drink alcohol or use illicit drugs. Independent for most of her  ADL.  Allergies  Allergen Reactions  . Metformin And Related     Renal insuff  . Omnipaque [Iohexol] Nausea And Vomiting    unknown    Family History  Problem Relation Age of  Onset  . Cancer Mother     stomach  . Cancer Father     liver  . Cirrhosis Father   . Cancer Sister     Prior to Admission medications   Medication Sig Start Date End Date Taking? Authorizing Provider  amiodarone (PACERONE) 200 MG tablet Take 1 tablet (200 mg total) by mouth daily. 02/01/13  Yes Charlynne Cousins, MD  amLODipine (NORVASC) 10 MG tablet Take 10 mg by mouth daily.     Yes Historical Provider, MD  calcium-vitamin D (OSCAL 500/200 D-3) 500-200 MG-UNIT per tablet Take 1 tablet by mouth 2 (two) times daily.     Yes Historical Provider, MD  diltiazem (CARDIZEM CD) 240 MG 24 hr capsule Take 1 capsule (240 mg total) by mouth daily. 01/20/13  Yes Charlynne Cousins, MD   Ferrous Sulfate (IRON) 325 (65 FE) MG TABS Take 325 mg by mouth 3 (three) times daily.    Yes Historical Provider, MD  fish oil-omega-3 fatty acids 1000 MG capsule Take 1 g by mouth 2 (two) times daily.     Yes Historical Provider, MD  furosemide (LASIX) 40 MG tablet Take 1 tablet (40 mg total) by mouth 2 (two) times daily. 01/20/13  Yes Charlynne Cousins, MD  levothyroxine (SYNTHROID, LEVOTHROID) 50 MCG tablet Take 50 mcg by mouth daily before breakfast.   Yes Historical Provider, MD  metoprolol (LOPRESSOR) 100 MG tablet Take 1 tablet (100 mg total) by mouth 2 (two) times daily. 01/20/13  Yes Charlynne Cousins, MD  mirtazapine (REMERON) 15 MG tablet Take 15 mg by mouth every morning.    Yes Historical Provider, MD  omega-3 acid ethyl esters (LOVAZA) 1 G capsule Take 1 capsule (1 g total) by mouth 2 (two) times daily. 01/20/13  Yes Charlynne Cousins, MD  omeprazole (PRILOSEC) 20 MG capsule Take 20 mg by mouth daily.     Yes Historical Provider, MD  pioglitazone (ACTOS) 30 MG tablet Take 30 mg by mouth daily.   Yes Historical Provider, MD  simvastatin (ZOCOR) 20 MG tablet Take 20 mg by mouth at bedtime.     Yes Historical Provider, MD  sitaGLIPtin (JANUVIA) 100 MG tablet Take 50 mg by mouth daily.     Yes Historical Provider, MD  Umeclidinium-Vilanterol (ANORO ELLIPTA) 62.5-25 MCG/INH AEPB Inhale 25 mcg into the lungs daily as needed (shortness of breath).   Yes Historical Provider, MD  warfarin (COUMADIN) 2.5 MG tablet Take 2.5 mg by mouth as directed. 01/26/13  Yes Historical Provider, MD  amiodarone (PACERONE) 200 MG tablet Take 1 tablet (200 mg total) by mouth 2 (two) times daily. 01/20/13 02/01/13  Charlynne Cousins, MD    Physical Exam: Filed Vitals:   01/27/13 2230 01/27/13 2244 01/27/13 2249 01/27/13 2306  BP:  142/38 149/31 151/66  Pulse: 48 48  51  Temp:    97.7 F (36.5 C)  TempSrc:    Oral  Resp: 12   18  Height:      Weight:      SpO2: 97% 98%  95%    General:  Alert, Awake and Oriented to Time, Place and Person. Appear in mild distress Eyes: PERRL ENT: Oral Mucosa clear dry. Neck: No JVD Cardiovascular: S1 and S2 Present, no Murmur, Peripheral Pulses Present Respiratory: Bilateral Air entry equal and Decreased, Clear to Auscultation, No Crackles, no wheezes Abdomen: Bowel Sound Present, Soft and Non tender Skin: No Rash Extremities: No Pedal edema, no calf tenderness Neurologic: Grossly Unremarkable.  Labs on Admission:  CBC:  Recent Labs Lab 01/27/13 1913 01/27/13 1947  WBC 14.4*  --   NEUTROABS 11.0*  --   HGB 12.6 13.9  HCT 36.7 41.0  MCV 93.9  --   PLT 172  --     CMP     Component Value Date/Time   NA 135 01/27/2013 1947   NA 149* 11/14/2012 1206   K 3.7 01/27/2013 1947   K 3.6 11/14/2012 1206   CL 93* 01/27/2013 1947   CO2 26 01/27/2013 1913   CO2 30* 11/14/2012 1206   GLUCOSE 401* 01/27/2013 1947   GLUCOSE 134 11/14/2012 1206   BUN 68* 01/27/2013 1947   BUN 30.3* 11/14/2012 1206   CREATININE 4.20* 01/27/2013 1947   CREATININE 1.7* 11/14/2012 1206   CALCIUM 9.6 01/27/2013 1913   CALCIUM 10.1 11/14/2012 1206   PROT 7.1 01/27/2013 1913   PROT 6.6 11/14/2012 1206   ALBUMIN 3.7 01/27/2013 1913   ALBUMIN 3.4* 11/14/2012 1206   AST 21 01/27/2013 1913   AST 20 11/14/2012 1206   ALT 22 01/27/2013 1913   ALT 19 11/14/2012 1206   ALKPHOS 93 01/27/2013 1913   ALKPHOS 65 11/14/2012 1206   BILITOT 0.5 01/27/2013 1913   BILITOT 0.35 11/14/2012 1206   GFRNONAA 10* 01/27/2013 1913   GFRAA 12* 01/27/2013 1913    No results found for this basename: LIPASE, AMYLASE,  in the last 168 hours No results found for this basename: AMMONIA,  in the last 168 hours  No results found for this basename: CKTOTAL, CKMB, CKMBINDEX, TROPONINI,  in the last 168 hours BNP (last 3 results)  Recent Labs  01/10/13 0605  PROBNP 7146.0*    Radiological Exams on Admission: No results found.  EKG: Independently reviewed. sinus  bradycardia.  Assessment/Plan Principal Problem:   Lethargy Active Problems:   COPD (chronic obstructive pulmonary disease) with emphysema   HTN (hypertension)   Chronic diastolic heart failure   Chronic atrial fibrillation   Sinus bradycardia   Acute on chronic kidney failure   1. Lethargy The patient is presenting with lethargy that has been ongoing ever since she has been discharged from the hospital. She also appears to have sinus bradycardia and acute on chronic kidney dysfunction. She denies any changes in medication after the discharge. Possible etiology of her lethargy and explained that both sinus bradycardia and renal dysfunction. I would gently hydrate her over 6 hours for acute kidney injury I would also hold 1 dose of Lasix tomorrow For her sinus bradycardia I will reduce the frequency amiodaron  from twice a day to once a day And also reduce the dose of metoprolol from 100 mg twice a day to 75 mg twice a day. Hopefully improvement of her heart rate would also improve her lethargy. Patient will be admitted for observation only  2. Atrial fibrillation Changes in medication as of mention above Continue Coumadin  3. Diabetes mellitus Continue sliding scale  DVT Prophylaxis: On Coumadin mechanical compression device Nutrition: Cardiac and diabetic diet   Code Status: Full   Disposition: Admitted to observation in telemetry unit.  Author: Berle Mull, MD [facility] Pager: [phone] 01/27/2013, 11:09 PM    If 7PM-7AM, please contact night-coverage [email or web address] Password [email or web address]

## 2013-01-28 ENCOUNTER — Encounter (HOSPITAL_COMMUNITY): Payer: Self-pay | Admitting: Cardiology

## 2013-01-28 DIAGNOSIS — N189 Chronic kidney disease, unspecified: Secondary | ICD-10-CM

## 2013-01-28 DIAGNOSIS — I5032 Chronic diastolic (congestive) heart failure: Secondary | ICD-10-CM | POA: Diagnosis not present

## 2013-01-28 DIAGNOSIS — N19 Unspecified kidney failure: Secondary | ICD-10-CM

## 2013-01-28 DIAGNOSIS — I4891 Unspecified atrial fibrillation: Secondary | ICD-10-CM | POA: Diagnosis not present

## 2013-01-28 DIAGNOSIS — R001 Bradycardia, unspecified: Secondary | ICD-10-CM | POA: Diagnosis present

## 2013-01-28 DIAGNOSIS — N179 Acute kidney failure, unspecified: Secondary | ICD-10-CM | POA: Diagnosis present

## 2013-01-28 DIAGNOSIS — R5383 Other fatigue: Secondary | ICD-10-CM | POA: Diagnosis present

## 2013-01-28 LAB — CBC
Hemoglobin: 10.8 g/dL — ABNORMAL LOW (ref 12.0–15.0)
MCH: 32 pg (ref 26.0–34.0)
MCHC: 34.8 g/dL (ref 30.0–36.0)
MCV: 91.7 fL (ref 78.0–100.0)
Platelets: 151 10*3/uL (ref 150–400)
RBC: 3.38 MIL/uL — ABNORMAL LOW (ref 3.87–5.11)
RDW: 14 % (ref 11.5–15.5)

## 2013-01-28 LAB — PROTIME-INR: Prothrombin Time: 28.1 seconds — ABNORMAL HIGH (ref 11.6–15.2)

## 2013-01-28 LAB — GLUCOSE, CAPILLARY
Glucose-Capillary: 225 mg/dL — ABNORMAL HIGH (ref 70–99)
Glucose-Capillary: 245 mg/dL — ABNORMAL HIGH (ref 70–99)
Glucose-Capillary: 188 mg/dL — ABNORMAL HIGH (ref 70–99)

## 2013-01-28 LAB — HEMOGLOBIN A1C: Hgb A1c MFr Bld: 7.2 % — ABNORMAL HIGH (ref <5.7)

## 2013-01-28 LAB — COMPREHENSIVE METABOLIC PANEL
ALT: 16 U/L (ref 0–35)
AST: 15 U/L (ref 0–37)
Albumin: 3.1 g/dL — ABNORMAL LOW (ref 3.5–5.2)
Chloride: 98 mEq/L (ref 96–112)
Creatinine, Ser: 3.22 mg/dL — ABNORMAL HIGH (ref 0.50–1.10)
Potassium: 3.1 mEq/L — ABNORMAL LOW (ref 3.5–5.1)
Sodium: 140 mEq/L (ref 135–145)
Total Bilirubin: 0.4 mg/dL (ref 0.3–1.2)

## 2013-01-28 LAB — TSH: TSH: 1.568 u[IU]/mL (ref 0.350–4.500)

## 2013-01-28 MED ORDER — INSULIN GLARGINE 100 UNIT/ML ~~LOC~~ SOLN
15.0000 [IU] | Freq: Every day | SUBCUTANEOUS | Status: DC
  Administered 2013-01-28 – 2013-01-30 (×3): 15 [IU] via SUBCUTANEOUS
  Filled 2013-01-28 (×3): qty 0.15

## 2013-01-28 MED ORDER — METOPROLOL TARTRATE 50 MG PO TABS: 50.0000 mg | ORAL_TABLET | Freq: Two times a day (BID) | ORAL | Status: DC

## 2013-01-28 MED ORDER — HYDRALAZINE HCL 50 MG PO TABS
50.0000 mg | ORAL_TABLET | Freq: Three times a day (TID) | ORAL | Status: DC
  Administered 2013-01-28 – 2013-01-30 (×7): 50 mg via ORAL
  Filled 2013-01-28 (×10): qty 1

## 2013-01-28 MED ORDER — POTASSIUM CHLORIDE CRYS ER 20 MEQ PO TBCR
40.0000 meq | EXTENDED_RELEASE_TABLET | Freq: Two times a day (BID) | ORAL | Status: AC
  Administered 2013-01-28 (×2): 40 meq via ORAL
  Filled 2013-01-28 (×3): qty 2

## 2013-01-28 MED ORDER — SODIUM CHLORIDE 0.9 % IV SOLN
INTRAVENOUS | Status: AC
  Administered 2013-01-28: 13:00:00 via INTRAVENOUS

## 2013-01-28 NOTE — Progress Notes (Signed)
ANTICOAGULATION CONSULT NOTE - Initial Consult  Pharmacy Consult for Coumadin Indication: atrial fibrillation  Allergies  Allergen Reactions  . Metformin And Related     Renal insuff  . Omnipaque [Iohexol] Nausea And Vomiting    unknown    Patient Measurements: Height: 5\' 7"  (170.2 cm) Weight: 176 lb 5.9 oz (80 kg) IBW/kg (Calculated) : 61.6  Vital Signs: Temp: 97.5 F (36.4 C) (11/22 1300) Temp src: Oral (11/22 1300) BP: 143/58 mmHg (11/22 1300) Pulse Rate: 49 (11/22 1300)  Labs:  Recent Labs  01/27/13 1913 01/27/13 1947 01/27/13 2059 01/28/13 0615  HGB 12.6 13.9  --  10.8*  HCT 36.7 41.0  --  31.0*  PLT 172  --   --  151  LABPROT  --   --  22.2* 28.1*  INR  --   --  2.02* 2.74*  CREATININE 3.82* 4.20*  --  3.22*    Estimated Creatinine Clearance: 15.4 ml/min (by C-G formula based on Cr of 3.22).  Assessment: 79yo female presents to ED from home after call from PCP stating blood sugar and renal labs were "bad", had recent admission for bradycardia, to continue Coumadin during admission for further workup; admitted with therapeutic INR. INR today is 2.7  Goal of Therapy:  INR 2-3   Plan:  Will continue home Coumadin dose of 2.5mg  daily and monitor INR.  Heide Guile, PharmD, BCPS Clinical Pharmacist Pager [phone]   01/28/2013,2:22 PM

## 2013-01-28 NOTE — Progress Notes (Addendum)
[facility]                                                                                Patient Demographics  Jackie Alvarez, is a 79 y.o. female, DOB - [birth year], [id number]  Admit date - 01/27/2013   Admitting Physician Berle Mull, MD  Outpatient Primary MD for the patient is Jonathon Bellows, MD  LOS - 1   Chief Complaint  Patient presents with  . Hyperglycemia  . Abnormal Lab        Assessment & Plan    1. Generalized weakness due to combination of acute renal insufficiency and severe bradycardia.- Blood pressures have been stable, will monitor heart rate for chonotropic response with activity, old all rate controlling agents which include beta blocker, Cardizem and amiodarone. Requested cardiology to evaluate the patient to adjust medications for atrial fibrillation. Continue gentle hydration for renal failure, creatinine is improving with hydration.     2. 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 Baseline creatinine recently has been around 3.5, however in January it was close to 1.5, we'll gently hydrate, hold ACE/ARB in all diuretics and NSAIDs. Monitor creatinine serially.     3. Atrial fibrillation. For now all rate controlling agents held due to #1 above, cardiology consulted, Coumadin continued, pharmacy monitoring INR in titrating dose.      4. Chronic grade 2 diastolic CHF. EF greater than 60% in October 2014, currently completely compensated. Monitor clinically.      5. Hypertension. Change to Norvasc and hydralazine, rate controlling agents held, will monitor.      6. Diabetes mellitus type 2. Glycemic control is poor, check A1c, Lantus added.  Lab Results  Component Value Date   HGBA1C 5.7* 01/07/2013    CBG (last 3)   Recent Labs  01/27/13 2252 01/27/13 2342 01/28/13 0550  GLUCAP 325* 305* 225*      7. Hypothyroidism. Recent TSH was stable few Clubb ago, continue present dose Synthroid. Will repeat TSH as patient has been on  amiodarone.  Lab Results  Component Value Date   TSH 1.921 01/13/2013      8. Hypokalemia. Replaced will monitor       Code Status:full  Family Communication:    Disposition Plan: home   Procedures     Consults  cardiology, PT   Medications  Scheduled Meds: . amLODipine  10 mg Oral Daily  . hydrALAZINE  50 mg Oral Q8H  . insulin aspart  0-15 Units Subcutaneous TID WC  . insulin aspart  0-5 Units Subcutaneous QHS  . insulin glargine  15 Units Subcutaneous Daily  . levothyroxine  50 mcg Oral QAC breakfast  . mirtazapine  15 mg Oral BH-q7a  . pantoprazole  40 mg Oral Daily  . potassium chloride  40 mEq Oral BID  . simvastatin  20 mg Oral QHS  . sodium chloride  3 mL Intravenous Q12H  . warfarin  2.5 mg Oral q1800  . Warfarin - Pharmacist Dosing Inpatient   Does not apply q1800   Continuous Infusions: . sodium chloride     PRN Meds:.ondansetron (ZOFRAN) IV, ondansetron  DVT Prophylaxis Coumadin Lab Results  Component Value Date   INR  2.74* 01/28/2013   INR 2.02* 01/27/2013   INR 3.00* 01/20/2013     Lab Results  Component Value Date   PLT 151 01/28/2013    Antibiotics     Anti-infectives   None          Subjective:   Jackie Alvarez today has, No headache, No chest pain, No abdominal pain - No Nausea, No new weakness tingling or numbness, No Cough - SOB.    Objective:   Filed Vitals:   01/27/13 2249 01/27/13 2306 01/28/13 0226 01/28/13 0458  BP: 149/31 151/66 143/33 134/70  Pulse:  51 59 44  Temp:  97.7 F (36.5 C)  97.8 F (36.6 C)  TempSrc:  Oral  Oral  Resp:  18  18  Height:      Weight:  80.332 kg (177 lb 1.6 oz)  80 kg (176 lb 5.9 oz)  SpO2:  95%  96%    Wt Readings from Last 3 Encounters:  01/28/13 80 kg (176 lb 5.9 oz)  01/20/13 79.334 kg (174 lb 14.4 oz)  12/28/12 87.726 kg (193 lb 6.4 oz)     Intake/Output Summary (Last 24 hours) at 01/28/13 1102 Last data filed at 01/28/13 0900  Gross per 24 hour  Intake    240  ml  Output      0 ml  Net    240 ml    Exam Awake Alert, Oriented X 3, No new F.N deficits, Normal affect [place].AT,PERRAL Supple Neck,No JVD, No cervical lymphadenopathy appriciated.  Symmetrical Chest wall movement, Good air movement bilaterally, CTAB RRR,No Gallops,Rubs or new Murmurs, No Parasternal Heave +ve B.Sounds, Abd Soft, Non tender, No organomegaly appriciated, No rebound - guarding or rigidity. No Cyanosis, Clubbing or edema, No new Rash or bruise      Data Review   Micro Results No results found for this or any previous visit (from the past 240 hour(s)).  Radiology Reports US Renal  01/13/2013   CLINICAL DATA:  Acute renal failure.  EXAM: RENAL/URINARY TRACT ULTRASOUND COMPLETE  COMPARISON:  CT 03/29/2008.  FINDINGS: Right Kidney  Length: 10.4. Tiny cortical echogenicity in the right upper renal pole may represent a parenchymal calcification or cysts with echogenic debris. 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  Left Kidney  Length: 10.7 cm long axis. Suboptimal visualization. No gross hydronephrosis.  Bladder  Appears normal for degree of bladder distention.  Left pleural effusion noted.  IMPRESSION: Negative for hydronephrosis or stone disease. Tiny echogenic focus in the right upper renal pole cortex probably represents a small echogenic cyst or parenchymal calcification.   Electronically Signed   By: Dereck Ligas M.D.   On: 01/13/2013 16:00   Dg Chest Port 1 View  01/12/2013   CLINICAL DATA:  60-year- female with nonproductive cough and pleural effusion. Initial encounter.  EXAM: PORTABLE CHEST - 1 VIEW  COMPARISON:  01/10/2013 and earlier.  FINDINGS: Portable AP upright view at 1017 hr. Small to moderate right pleural effusion probably not significantly changed allowing for differences in patient positioning. Continued increased interstitial opacity. No pneumothorax. Persistent left retrocardiac opacity but no definite left effusion. Stable  postoperative changes to the left axilla. Stable cardiac size and mediastinal contours.  IMPRESSION: Small to moderate right pleural effusion not significantly changed allowing for different patient positioning. Right greater than left lower lobe collapse or consolidation. Continued vascular congestion.   Electronically Signed   By: Lars Pinks M.D.   On: 01/12/2013 17:36  Dg Chest Port 1 View  01/10/2013   CLINICAL DATA:  Shortness of breath  EXAM: PORTABLE CHEST - 1 VIEW  COMPARISON:  11/14/2012  FINDINGS: Cardiac shadow is stable. A moderate size right-sided pleural effusion is noted increased from the prior exam. Some mild interstitial changes are seen which may be related to mild vascular congestion.  IMPRESSION: Moderate right-sided pleural effusion new from the prior exam.  Mild vascular congestion.   Electronically Signed   By: Inez Catalina M.D.   On: 01/10/2013 07:35    CBC  Recent Labs Lab 01/27/13 1913 01/27/13 1947 01/28/13 0615  WBC 14.4*  --  7.7  HGB 12.6 13.9 10.8*  HCT 36.7 41.0 31.0*  PLT 172  --  151  MCV 93.9  --  91.7  MCH 32.2  --  32.0  MCHC 34.3  --  34.8  RDW 14.1  --  14.0  LYMPHSABS 2.2  --   --   MONOABS 1.1*  --   --   EOSABS 0.2  --   --   BASOSABS 0.0  --   --     Chemistries   Recent Labs Lab 01/27/13 1913 01/27/13 1947 01/28/13 0615  NA 133* 135 140  K 4.0 3.7 3.1*  CL 89* 93* 98  CO2 26  --  28  GLUCOSE 416* 401* 236*  BUN 67* 68* 61*  CREATININE 3.82* 4.20* 3.22*  CALCIUM 9.6  --  8.6  AST 21  --  15  ALT 22  --  16  ALKPHOS 93  --  62  BILITOT 0.5  --  0.4   ------------------------------------------------------------------------------------------------------------------ estimated creatinine clearance is 15.4 ml/min (by C-G formula based on Cr of 3.22). ------------------------------------------------------------------------------------------------------------------ No results found for this basename: HGBA1C,  in the last 72  hours ------------------------------------------------------------------------------------------------------------------ No results found for this basename: CHOL, HDL, LDLCALC, TRIG, CHOLHDL, LDLDIRECT,  in the last 72 hours ------------------------------------------------------------------------------------------------------------------ No results found for this basename: TSH, T4TOTAL, FREET3, T3FREE, THYROIDAB,  in the last 72 hours ------------------------------------------------------------------------------------------------------------------ No results found for this basename: VITAMINB12, FOLATE, FERRITIN, TIBC, IRON, RETICCTPCT,  in the last 72 hours  Coagulation profile  Recent Labs Lab 01/27/13 2059 01/28/13 0615  INR 2.02* 2.74*    No results found for this basename: DDIMER,  in the last 72 hours  Cardiac Enzymes No results found for this basename: CK, CKMB, TROPONINI, MYOGLOBIN,  in the last 168 hours ------------------------------------------------------------------------------------------------------------------ No components found with this basename: POCBNP,      Time Spent in minutes  35   Lala Lund K M.D on 01/28/2013 at 11:02 AM  Between 7am to 7pm - Pager - [phone]  After 7pm go to [email or web address] - password [email or web address]  And look for the night coverage person covering for me after hours  [facility] Office  [phone]

## 2013-01-28 NOTE — Progress Notes (Signed)
Spoke with MD about pt heart rate.  No orders at this time will continue to monitor.  Call light in reach.   Nolon Nations, RN

## 2013-01-28 NOTE — Progress Notes (Signed)
MD paged and made aware of pt heart sustained in the 30's.  Even when aroused pt heart rate maintained at 36-38 beats per minute.  Pt is asymptomatic, resting in bed, with call light in reach.   Nolon Nations, RN

## 2013-01-28 NOTE — Progress Notes (Signed)
Ambulated with patient in hallway. Patient ambulated on room air and using a front wheel walker. Patient states that she has a walker at home, but doesn't use it often. Patient also stated that she fell at home a couple days ago she "tripped over her feet". I asked the patient does she fall a lot and she said no.   Patient ambulated approx. 150 feet walked to the end of the nurses station and then back to her room. Patient tolerated walk fairly well. No SOB. Patient up to chair. Jackie Alvarez R

## 2013-01-28 NOTE — Consult Note (Signed)
CARDIOLOGY CONSULT NOTE  Patient ID: Jackie Alvarez MRN: [id number] DOB/AGE: 77-26-1933 77 y.o.  Admit date: 01/27/2013 Primary Physician Jonathon Bellows, MD Primary Cardiologist Dr. Fransico Him Chief Complaint  Weakness  HPI:  The patient has a history of atrial fib and diastolic HF.  She was discharged on 11/14 after a two week hospitalization for treatment of atrial fib, renal insufficiency and acute on chronic diastolic HF.   She was also treated for a UTI.  She saw her primary MD on the day of discharge.  She was having increased lethargy.  She was referred to the ER because of worsening renal function.  Creat yesterday was 3.82.  Previous baseline appears to be 2.88.   She was bradycardic as well with rates in the 40s with sinus rhythm.     She says that she was feeling "halfway decent" although she was tired.  The patient denies any new symptoms such as chest discomfort, neck or arm discomfort. There has been no new shortness of breath, PND or orthopnea. There have been no reported palpitations, presyncope or syncope.   Past Medical History  Diagnosis Date  . Diabetes mellitus   . Hypertension   . Leg pain   . Peripheral vascular disease   . Hyperlipidemia   . Hemorrhoids   . Ulcer 2009    gatric with history of bleeding - resolved  . COPD (chronic obstructive pulmonary disease)   . Cancer 1994    Left Breast  . Carotid artery occlusion     mild bilateral  . Anxiety   . Central retinal vein occlusion, right eye   . Anemia   . Chronic kidney disease     CKD stage III  . Atrial flutter 2008    remote  . Thyroid disease     toxic multinodular goiter with history of thyrotoxicosis now s/p RAI ablative therapy - now hypothyroid  . Depression   . Peripheral neuropathy   . GERD (gastroesophageal reflux disease)   . Obesity   . Hiatal hernia   . Schatzki's ring   . Cholelithiases   . Anorexia nervosa   . Chronic diastolic CHF (congestive heart failure)   . Aortic  stenosis, mild     echo 12/2012  . Pulmonary HTN     mild PASP 56mmHg by echo 12/2012    Past Surgical History  Procedure Laterality Date  . Wrist surgery  02/2009  . Cholecystectomy      Gall Bladder  . Pr vein bypass graft,aorto-fem-pop    . Abdominal hysterectomy      with BSO  . Carotid endarterectomy    . Breast lumpectomy    . Abdominal aortagram  Jan. 18, 2013  . Hip surgery Left     Allergies  Allergen Reactions  . Metformin And Related     Renal insuff  . Omnipaque [Iohexol] Nausea And Vomiting    unknown   Prescriptions prior to admission  Medication Sig Dispense Refill  . [START ON 02/01/2013] amiodarone (PACERONE) 200 MG tablet Take 1 tablet (200 mg total) by mouth daily.  30 tablet  0  . amLODipine (NORVASC) 10 MG tablet Take 10 mg by mouth daily.        . calcium-vitamin D (OSCAL 500/200 D-3) 500-200 MG-UNIT per tablet Take 1 tablet by mouth 2 (two) times daily.        Marland Kitchen diltiazem (CARDIZEM CD) 240 MG 24 hr capsule Take 1 capsule (240 mg total) by mouth  daily.  30 capsule  0  . Ferrous Sulfate (IRON) 325 (65 FE) MG TABS Take 325 mg by mouth 3 (three) times daily.       . fish oil-omega-3 fatty acids 1000 MG capsule Take 1 g by mouth 2 (two) times daily.        . furosemide (LASIX) 40 MG tablet Take 1 tablet (40 mg total) by mouth 2 (two) times daily.  30 tablet  0  . levothyroxine (SYNTHROID, LEVOTHROID) 50 MCG tablet Take 50 mcg by mouth daily before breakfast.      . metoprolol (LOPRESSOR) 100 MG tablet Take 1 tablet (100 mg total) by mouth 2 (two) times daily.  30 tablet  0  . mirtazapine (REMERON) 15 MG tablet Take 15 mg by mouth every morning.       Marland Kitchen omega-3 acid ethyl esters (LOVAZA) 1 G capsule Take 1 capsule (1 g total) by mouth 2 (two) times daily.  60 capsule  0  . omeprazole (PRILOSEC) 20 MG capsule Take 20 mg by mouth daily.        . pioglitazone (ACTOS) 30 MG tablet Take 30 mg by mouth daily.      . simvastatin (ZOCOR) 20 MG tablet Take 20 mg by  mouth at bedtime.        . sitaGLIPtin (JANUVIA) 100 MG tablet Take 50 mg by mouth daily.        Marland Kitchen Umeclidinium-Vilanterol (ANORO ELLIPTA) 62.5-25 MCG/INH AEPB Inhale 25 mcg into the lungs daily as needed (shortness of breath).      . warfarin (COUMADIN) 2.5 MG tablet Take 2.5 mg by mouth as directed.      Marland Kitchen amiodarone (PACERONE) 200 MG tablet Take 1 tablet (200 mg total) by mouth 2 (two) times daily.  24 tablet  0   Family History  Problem Relation Age of Onset  . Cancer Mother     stomach  . Cancer Father     liver  . Cirrhosis Father   . Cancer Sister     History   Social History  . Marital Status: Married    Spouse Name: N/A    Number of Children: N/A  . Years of Education: N/A   Occupational History  . retired    Social History Main Topics  . Smoking status: Former Smoker -- 1.00 packs/day for 45 years    Types: Cigarettes    Quit date: 01/26/1997  . Smokeless tobacco: Never Used  . Alcohol Use: No  . Drug Use: No  . Sexual Activity: No   Other Topics Concern  . Not on file   Social History Narrative  . No narrative on file     ROS:  Cold natured.    As stated in the HPI and negative for all other systems.  Physical Exam: Blood pressure 136/55, pulse 44, temperature 97.8 F (36.6 C), temperature source Oral, resp. rate 18, height 5\' 7"  (1.702 m), weight 176 lb 5.9 oz (80 kg), SpO2 96.00%.  GENERAL:  Well appearing HEENT:  Pupils equal round and reactive, fundi not visualized, oral mucosa unremarkable NECK:  No jugular venous distention, waveform within normal limits, carotid upstroke brisk and symmetric, left carotid bruit, no thyromegaly LYMPHATICS:  No cervical, inguinal adenopathy LUNGS:  Clear to auscultation bilaterally BACK:  No CVA tenderness CHEST:  Unremarkable HEART:  PMI not displaced or sustained,S1 and S2 within normal limits, no S3, no S4, no clicks, no rubs, soft apical early peaking systolic murmur without diastolic  murmurs ABD:  Flat,  positive bowel sounds normal in frequency in pitch, no bruits, no rebound, no guarding, no midline pulsatile mass, no hepatomegaly, no splenomegaly EXT:  2 plus pulses throughout, no edema, no cyanosis no clubbing SKIN:  No rashes no nodules NEURO:  Cranial nerves II through XII grossly intact, motor grossly intact throughout PSYCH:  Cognitively intact, oriented to person place and time   Labs: Lab Results  Component Value Date   BUN 61* 01/28/2013   Lab Results  Component Value Date   CREATININE 3.22* 01/28/2013   Lab Results  Component Value Date   NA 140 01/28/2013   K 3.1* 01/28/2013   CL 98 01/28/2013   CO2 28 01/28/2013    Lab Results  Component Value Date   WBC 7.7 01/28/2013   HGB 10.8* 01/28/2013   HCT 31.0* 01/28/2013   MCV 91.7 01/28/2013   PLT 151 01/28/2013   No results found for this basename: CHOL, HDL, LDLCALC, LDLDIRECT, TRIG, CHOLHDL   Lab Results  Component Value Date   ALT 16 01/28/2013   AST 15 01/28/2013   ALKPHOS 62 01/28/2013   BILITOT 0.4 01/28/2013     EKG: Sinus bradycardia, rate 43, QTC prolonged.  Nonspecific T wave flattening.    ASSESSMENT AND PLAN:   ATRIAL FIB:  INR is therapeutic.  Continue warfarin. She is maintaining NSR but with bradycardia.  Given this and the weakness I agree with holding the beta blocker and amiodarone  for now.    She will likely be discharged on amiodarone.  CKD:  Holding diuretic and continuing gentle hydration.   DIASTOLIC HF (CHRONIC):  She seems to be euvolemic and as above I agree that she can handle having positive I/Os for a couple of days as the creat improves.    HTN:  Agree with continuing hydralazine and amlodipine.    PROLONGED QT:  Follow QT with replacement of potassium.   SignedMinus Breeding 01/28/2013, 12:42 PM

## 2013-01-28 NOTE — ED Provider Notes (Signed)
CSN: [id number]     Arrival date & time 01/27/13  1834 History   First MD Initiated Contact with Patient 01/27/13 2012     Chief Complaint  Patient presents with  . Hyperglycemia  . Abnormal Lab   (Consider location/radiation/quality/duration/timing/severity/associated sxs/prior Treatment) HPI Jackie Alvarez is a 79 y.o. female who presented to the ED at the discretion of her PCP for concern of worsening renal function.  Patient was just discharged 1 week ago for an exacerbation of her CHF and her CKD.  Since discharge she reports generalized fatigue but no other symptoms.  Patient reports taking her medications as prescribed.  She went in today to see her PCP and labs were checked.  She then received a call to come in to the ED.  Otherwise she has no other symptoms.  Still making urine.  Past Medical History  Diagnosis Date  . Diabetes mellitus   . Hypertension   . Leg pain   . Peripheral vascular disease   . Hyperlipidemia   . Hemorrhoids   . Ulcer 2009    gatric with history of bleeding - resolved  . COPD (chronic obstructive pulmonary disease)   . Cancer 1994    Left Breast  . Carotid artery occlusion     mild bilateral  . Anxiety   . Central retinal vein occlusion, right eye   . Anemia   . Chronic kidney disease     CKD stage III  . Atrial flutter 2008    remote  . Thyroid disease     toxic multinodular goiter with history of thyrotoxicosis now s/p RAI ablative therapy - now hypothyroid  . Depression   . Peripheral neuropathy   . GERD (gastroesophageal reflux disease)   . Obesity   . Hiatal hernia   . Schatzki's ring   . Cholelithiases   . Anorexia nervosa   . Chronic diastolic CHF (congestive heart failure)   . Aortic stenosis, mild     echo 12/2012  . Pulmonary HTN     mild PASP 44mmHg by echo 12/2012   Past Surgical History  Procedure Laterality Date  . Wrist surgery  02/2009  . Cholecystectomy      Gall Bladder  . Pr vein bypass graft,aorto-fem-pop     . Abdominal hysterectomy      with BSO  . Carotid endarterectomy    . Breast lumpectomy    . Abdominal aortagram  Jan. 18, 2013  . Hip surgery Left    Family History  Problem Relation Age of Onset  . Cancer Mother     stomach  . Cancer Father     liver  . Cirrhosis Father   . Cancer Sister    History  Substance Use Topics  . Smoking status: Former Smoker -- 1.00 packs/day for 45 years    Types: Cigarettes    Quit date: 01/26/1997  . Smokeless tobacco: Never Used  . Alcohol Use: No   OB History   Grav Para Term Preterm Abortions TAB SAB Ect Mult Living                 Review of Systems  Constitutional: Positive for fatigue. Negative for fever and chills.  HENT: Negative for congestion and rhinorrhea.   Respiratory: Negative for cough and shortness of breath.   Cardiovascular: Negative for chest pain.  Gastrointestinal: Negative for nausea, vomiting, abdominal pain, diarrhea and abdominal distention.  Endocrine: Negative for polyuria.  Genitourinary: Negative for dysuria.  Musculoskeletal: Negative  for neck pain and neck stiffness.  Skin: Negative for rash.  Neurological: Negative for headaches.  Psychiatric/Behavioral: Negative.     Allergies  Metformin and related and Omnipaque  Home Medications  No current outpatient prescriptions on file. BP 151/66  Pulse 51  Temp(Src) 97.7 F (36.5 C) (Oral)  Resp 18  Ht 5\' 7"  (1.702 m)  Wt 177 lb 1.6 oz (80.332 kg)  BMI 27.73 kg/m2  SpO2 95% Physical Exam  Nursing note and vitals reviewed. Constitutional: She is oriented to person, place, and time. She appears well-developed and well-nourished. No distress.  HENT:  Head: Normocephalic and atraumatic.  Right Ear: External ear normal.  Left Ear: External ear normal.  Nose: Nose normal.  Mouth/Throat: Oropharynx is clear and moist. No oropharyngeal exudate.  Eyes: EOM are normal. Pupils are equal, round, and reactive to light.  Neck: Normal range of motion. Neck  supple. No tracheal deviation present.  Cardiovascular: Bradycardia present.   Pulmonary/Chest: Effort normal and breath sounds normal. No stridor. No respiratory distress. She has no wheezes. She has no rales.  Abdominal: Soft. She exhibits no distension. There is no tenderness. There is no rebound.  Musculoskeletal: Normal range of motion.  Neurological: She is alert and oriented to person, place, and time.  Skin: Skin is warm and dry. She is not diaphoretic.    ED Course  Procedures (including critical care time) Labs Review Labs Reviewed  CBC WITH DIFFERENTIAL - Abnormal; Notable for the following:    WBC 14.4 (*)    Neutro Abs 11.0 (*)    Monocytes Absolute 1.1 (*)    All other components within normal limits  COMPREHENSIVE METABOLIC PANEL - Abnormal; Notable for the following:    Sodium 133 (*)    Chloride 89 (*)    Glucose, Bld 416 (*)    BUN 67 (*)    Creatinine, Ser 3.82 (*)    GFR calc non Af Amer 10 (*)    GFR calc Af Amer 12 (*)    All other components within normal limits  GLUCOSE, CAPILLARY - Abnormal; Notable for the following:    Glucose-Capillary 374 (*)    All other components within normal limits  PROTIME-INR - Abnormal; Notable for the following:    Prothrombin Time 22.2 (*)    INR 2.02 (*)    All other components within normal limits  GLUCOSE, CAPILLARY - Abnormal; Notable for the following:    Glucose-Capillary 325 (*)    All other components within normal limits  GLUCOSE, CAPILLARY - Abnormal; Notable for the following:    Glucose-Capillary 305 (*)    All other components within normal limits  POCT I-STAT, CHEM 8 - Abnormal; Notable for the following:    Chloride 93 (*)    BUN 68 (*)    Creatinine, Ser 4.20 (*)    Glucose, Bld 401 (*)    Calcium, Ion 1.12 (*)    All other components within normal limits  COMPREHENSIVE METABOLIC PANEL  CBC  PROTIME-INR   Imaging Review No results found.  EKG Interpretation    Date/Time:  Friday January 27 2013 20:32:38 EST Ventricular Rate:  40 PR Interval:  77 QRS Duration: 103 QT Interval:  542 QTC Calculation: 442 R Axis:   68 Text Interpretation:  Sinus bradycardia Short PR interval Consider right atrial enlargement Anteroseptal infarct, old Nonspecific repol abnormality, diffuse leads Baseline wander in lead(s) V1 Artifact Confirmed by Christy Gentles  MD, [place] [phone]) on 01/27/2013 8:44:40 PM  MDM   1. Renal failure     Jackie Alvarez is a 79 y.o. female with CKD, diastolic heart failure, and atrial fibrillation who presents to the ED with AoCRF and hyperglycemia.  Internal medicine consulted on arrival, patient appearing overdiuresed on exam, 500cc NS administered.  Patient safe for admission to the floor.  Patient admitted.    Rogelia Mire, MD 01/28/13 [phone]

## 2013-01-29 DIAGNOSIS — N19 Unspecified kidney failure: Secondary | ICD-10-CM | POA: Diagnosis not present

## 2013-01-29 DIAGNOSIS — Z9229 Personal history of other drug therapy: Secondary | ICD-10-CM

## 2013-01-29 DIAGNOSIS — I498 Other specified cardiac arrhythmias: Secondary | ICD-10-CM | POA: Diagnosis not present

## 2013-01-29 DIAGNOSIS — I4892 Unspecified atrial flutter: Secondary | ICD-10-CM | POA: Diagnosis not present

## 2013-01-29 DIAGNOSIS — I4819 Other persistent atrial fibrillation: Secondary | ICD-10-CM | POA: Diagnosis present

## 2013-01-29 DIAGNOSIS — R9431 Abnormal electrocardiogram [ECG] [EKG]: Secondary | ICD-10-CM | POA: Diagnosis present

## 2013-01-29 DIAGNOSIS — I5032 Chronic diastolic (congestive) heart failure: Secondary | ICD-10-CM | POA: Diagnosis not present

## 2013-01-29 DIAGNOSIS — N179 Acute kidney failure, unspecified: Secondary | ICD-10-CM | POA: Diagnosis not present

## 2013-01-29 LAB — GLUCOSE, CAPILLARY
Glucose-Capillary: 226 mg/dL — ABNORMAL HIGH (ref 70–99)
Glucose-Capillary: 169 mg/dL — ABNORMAL HIGH (ref 70–99)
Glucose-Capillary: 148 mg/dL — ABNORMAL HIGH (ref 70–99)

## 2013-01-29 LAB — BASIC METABOLIC PANEL
CO2: 26 mEq/L (ref 19–32)
Calcium: 8.9 mg/dL (ref 8.4–10.5)
Creatinine, Ser: 2.84 mg/dL — ABNORMAL HIGH (ref 0.50–1.10)
GFR calc Af Amer: 17 mL/min — ABNORMAL LOW (ref >90)
GFR calc non Af Amer: 15 mL/min — ABNORMAL LOW (ref >90)
Sodium: 137 mEq/L (ref 135–145)

## 2013-01-29 LAB — PROTIME-INR
INR: 3.36 — ABNORMAL HIGH (ref 0.00–1.49)
Prothrombin Time: 32.8 seconds — ABNORMAL HIGH (ref 11.6–15.2)

## 2013-01-29 MED ORDER — SODIUM CHLORIDE 0.9 % IV SOLN
INTRAVENOUS | Status: AC
  Administered 2013-01-29: 13:00:00 via INTRAVENOUS

## 2013-01-29 NOTE — Progress Notes (Signed)
Ambulated in hallway. BP and HR before ambulation = (149/46, HR=58)   Patient ambulated on RA using rolling walker. Maintained 02 sats between 96 & 100%. HR did increase with ambulation, residing in the 70's, and max  80bpm. Patient did not exhibit or express any SOB. Patient tolerated walk well. Ambulated approx. 200 feet.   Vitals signs post ambulation: BP 149/36 and HR 58.  Will continue to monitor. Roxan Hockey RN

## 2013-01-29 NOTE — Progress Notes (Signed)
ANTICOAGULATION CONSULT NOTE - Initial Consult  Pharmacy Consult for Coumadin Indication: atrial fibrillation  Allergies  Allergen Reactions  . Metformin And Related     Renal insuff  . Omnipaque [Iohexol] Nausea And Vomiting    unknown    Patient Measurements: Height: 5\' 7"  (170.2 cm) Weight: 177 lb (80.287 kg) IBW/kg (Calculated) : 61.6  Vital Signs: Temp: 98.2 F (36.8 C) (11/23 0443) Temp src: Oral (11/23 0443) BP: 149/36 mmHg (11/23 1239) Pulse Rate: 58 (11/23 1239)  Labs:  Recent Labs  01/27/13 1913 01/27/13 1947 01/27/13 2059 01/28/13 0615 01/29/13 0540  HGB 12.6 13.9  --  10.8*  --   HCT 36.7 41.0  --  31.0*  --   PLT 172  --   --  151  --   LABPROT  --   --  22.2* 28.1* 32.8*  INR  --   --  2.02* 2.74* 3.36*  CREATININE 3.82* 4.20*  --  3.22* 2.84*    Estimated Creatinine Clearance: 17.5 ml/min (by C-G formula based on Cr of 2.84).  Assessment: 79yo female presents to ED from home after call from PCP stating blood sugar and renal labs were "bad", had recent admission for bradycardia, to continue Coumadin during admission for further workup; admitted with therapeutic INR. INR today is 3.36  Goal of Therapy:  INR 2-3   Plan:  No warfarin today. Daily protimes  Heide Guile, PharmD, BCPS Clinical Pharmacist Pager [phone]   01/29/2013,2:10 PM

## 2013-01-29 NOTE — Progress Notes (Signed)
Patient ID: Jackie Alvarez, female   DOB: 02/26/1932, 79 y.o.   MRN: [id number]    SUBJECTIVE:  Patient is feeling better. However she says that she has been feeling weak at home. She says that she has not had any definite syncope. She did have a fall but she thinks she tripped.   Filed Vitals:   01/28/13 1300 01/28/13 2012 01/29/13 0443 01/29/13 0616  BP: 143/58 152/70 169/50 150/40  Pulse: 49 53 56   Temp: 97.5 F (36.4 C) 97.6 F (36.4 C) 98.2 F (36.8 C)   TempSrc: Oral Oral Oral   Resp: 18 19 19    Height:      Weight:   177 lb (80.287 kg)   SpO2: 98% 96% 95%      Intake/Output Summary (Last 24 hours) at 01/29/13 0838 Last data filed at 01/29/13 0700  Gross per 24 hour  Intake    480 ml  Output    125 ml  Net    355 ml    LABS: Basic Metabolic Panel:  Recent Labs  01/28/13 0615 01/29/13 0540  NA 140 137  K 3.1* 4.0  CL 98 101  CO2 28 26  GLUCOSE 236* 236*  BUN 61* 52*  CREATININE 3.22* 2.84*  CALCIUM 8.6 8.9   Liver Function Tests:  Recent Labs  01/27/13 1913 01/28/13 0615  AST 21 15  ALT 22 16  ALKPHOS 93 62  BILITOT 0.5 0.4  PROT 7.1 5.7*  ALBUMIN 3.7 3.1*   No results found for this basename: LIPASE, AMYLASE,  in the last 72 hours CBC:  Recent Labs  01/27/13 1913 01/27/13 1947 01/28/13 0615  WBC 14.4*  --  7.7  NEUTROABS 11.0*  --   --   HGB 12.6 13.9 10.8*  HCT 36.7 41.0 31.0*  MCV 93.9  --  91.7  PLT 172  --  151   Cardiac Enzymes: No results found for this basename: CKTOTAL, CKMB, CKMBINDEX, TROPONINI,  in the last 72 hours BNP: No components found with this basename: POCBNP,  D-Dimer: No results found for this basename: DDIMER,  in the last 72 hours Hemoglobin A1C:  Recent Labs  01/28/13 0930  HGBA1C 7.2*   Fasting Lipid Panel: No results found for this basename: CHOL, HDL, LDLCALC, TRIG, CHOLHDL, LDLDIRECT,  in the last 72 hours Thyroid Function Tests:  Recent Labs  01/28/13 0930  TSH 1.568    RADIOLOGY: US  Renal  01/13/2013   CLINICAL DATA:  Acute renal failure.  EXAM: RENAL/URINARY TRACT ULTRASOUND COMPLETE  COMPARISON:  CT 03/29/2008.  FINDINGS: Right Kidney  Length: 10.4. Tiny cortical echogenicity in the right upper renal pole may represent a parenchymal calcification or cysts with echogenic debris. 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  Left Kidney  Length: 10.7 cm long axis. Suboptimal visualization. No gross hydronephrosis.  Bladder  Appears normal for degree of bladder distention.  Left pleural effusion noted.  IMPRESSION: Negative for hydronephrosis or stone disease. Tiny echogenic focus in the right upper renal pole cortex probably represents a small echogenic cyst or parenchymal calcification.   Electronically Signed   By: Dereck Ligas M.D.   On: 01/13/2013 16:00   Dg Chest Port 1 View  01/12/2013   CLINICAL DATA:  56-year- female with nonproductive cough and pleural effusion. Initial encounter.  EXAM: PORTABLE CHEST - 1 VIEW  COMPARISON:  01/10/2013 and earlier.  FINDINGS: Portable AP upright view at 1017 hr. Small to moderate  right pleural effusion probably not significantly changed allowing for differences in patient positioning. Continued increased interstitial opacity. No pneumothorax. Persistent left retrocardiac opacity but no definite left effusion. Stable postoperative changes to the left axilla. Stable cardiac size and mediastinal contours.  IMPRESSION: Small to moderate right pleural effusion not significantly changed allowing for different patient positioning. Right greater than left lower lobe collapse or consolidation. Continued vascular congestion.   Electronically Signed   By: Lars Pinks M.D.   On: 01/12/2013 17:36   Dg Chest Port 1 View  01/10/2013   CLINICAL DATA:  Shortness of breath  EXAM: PORTABLE CHEST - 1 VIEW  COMPARISON:  11/14/2012  FINDINGS: Cardiac shadow is stable. A moderate size right-sided pleural effusion is noted increased from the  prior exam. Some mild interstitial changes are seen which may be related to mild vascular congestion.  IMPRESSION: Moderate right-sided pleural effusion new from the prior exam.  Mild vascular congestion.   Electronically Signed   By: Inez Catalina M.D.   On: 01/10/2013 07:35    PHYSICAL EXAM   patient is comfortable flat in bed. There is no jugulovenous distention. Lungs reveal scattered rhonchi. Cardiac exam reveals S1 and S2. There is no respiratory distress. There is no significant peripheral edema.  TELEMETRY: I have reviewed telemetry today January 29, 2013. There is sinus rhythm with sinus bradycardia.   ASSESSMENT AND PLAN:    Lethargy   COPD (chronic obstructive pulmonary disease) with emphysema   HTN (hypertension    Chronic diastolic heart failure    The patient's volume status is stable. With the renal dysfunction, continue to hold her diuretic today.    Sinus bradycardia     She has continued sinus bradycardia. Her rate this morning is in the range of 40-50. For now the plan will have to be to continue to hold rate slowing medicines. This would include continuing hold amiodarone today. It will be a difficult decision about the planning for ongoing amiodarone as she goes home. This decision can be made best by her primary cardiologist    Acute on chronic kidney failure    Renal function is improving today.    Paroxysmal atrial fibrillation     She is holding sinus rhythm at this time    Hx of amiodarone therapy     Amiodarone is on hold at this point.    Prolonged QT interval     QT interval today is not prolonged on a 12-lead EKG. Continue to follow.  The plan for now would be to continue to hold her medications and watch her heart rate. See if she can increase her activity in the hallway safely here and monitor her heart rate response.   Dola Argyle 01/29/2013 8:38 AM

## 2013-01-29 NOTE — ED Provider Notes (Signed)
I have personally seen and examined the patient.  I have discussed the plan of care with the resident.  I have reviewed the documentation on PMH/FH/Soc. History.  I have reviewed the documentation of the resident and agree.  I have reviewed and agree with the ECG interpretation(s) documented by the resident.  Pt noted to be bradycardic but stable Pt agreeable for admission No other complaints on my evaluation   Sharyon Cable, MD 01/29/13 2207

## 2013-01-29 NOTE — Progress Notes (Signed)
[facility]                                                                                Patient Demographics  Jackie Alvarez, is a 79 y.o. female, DOB - 1931-04-23, [id number]  Admit date - 01/27/2013   Admitting Physician Berle Mull, MD  Outpatient Primary MD for the patient is Jonathon Bellows, MD  LOS - 2   Chief Complaint  Patient presents with  . Hyperglycemia  . Abnormal Lab        Assessment & Plan    1. Generalized weakness due to combination of acute renal insufficiency and severe bradycardia.- Blood pressures have been stable, will monitor heart rate for chonotropic response with activity, old all rate controlling agents which include beta blocker, Cardizem and amiodarone. Followed by cardiology who recommended one more day of hospital stay. Her heart rate and overall feeling of fatigue is much improved today.     2. 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 Baseline creatinine recently has been around 3.5, however in January it was close to 1.5, we'll gently hydrate, hold ACE/ARB in all diuretics and NSAIDs. Renal function has been improving.     3. Atrial fibrillation. For now all rate controlling agents held due to #1 above, cardiology consulted, Coumadin continued, pharmacy monitoring INR & titrating Coumadin dose.      4. Chronic grade 2 diastolic CHF. EF greater than 60% in October 2014, currently completely compensated. Monitor clinically.      5. Hypertension. Change to Norvasc and hydralazine, rate controlling agents held, will monitor.      6. Diabetes mellitus type 2. Glycemic control is poor, check A1c, Lantus added.  Lab Results  Component Value Date   HGBA1C 7.2* 01/28/2013    CBG (last 3)   Recent Labs  01/28/13 1643 01/28/13 2117 01/29/13 0602  GLUCAP 223* 188* 211*      7. Hypothyroidism. Recent TSH was stable few [place] ago, continue present dose Synthroid.    Lab Results  Component Value Date   TSH 1.568 01/28/2013       8. Hypokalemia. Replaced and stable.       Code Status:full  Family Communication:    Disposition Plan: home   Procedures     Consults  cardiology, PT   Medications  Scheduled Meds: . amLODipine  10 mg Oral Daily  . hydrALAZINE  50 mg Oral Q8H  . insulin aspart  0-15 Units Subcutaneous TID WC  . insulin aspart  0-5 Units Subcutaneous QHS  . insulin glargine  15 Units Subcutaneous Daily  . levothyroxine  50 mcg Oral QAC breakfast  . mirtazapine  15 mg Oral BH-q7a  . pantoprazole  40 mg Oral Daily  . simvastatin  20 mg Oral QHS  . sodium chloride  3 mL Intravenous Q12H  . warfarin  2.5 mg Oral q1800  . Warfarin - Pharmacist Dosing Inpatient   Does not apply q1800   Continuous Infusions:   PRN Meds:.ondansetron (ZOFRAN) IV, ondansetron  DVT Prophylaxis Coumadin Lab Results  Component Value Date   INR 3.36* 01/29/2013   INR 2.74* 01/28/2013   INR 2.02* 01/27/2013     Lab Results  Component Value Date   PLT 151 01/28/2013    Antibiotics     Anti-infectives   None          Subjective:   Jackie Alvarez today has, No headache, No chest pain, No abdominal pain - No Nausea, No new weakness tingling or numbness, No Cough - SOB.    Objective:   Filed Vitals:   01/28/13 2012 01/29/13 0443 01/29/13 0616 01/29/13 0948  BP: 152/70 169/50 150/40 151/34  Pulse: 53 56    Temp: 97.6 F (36.4 C) 98.2 F (36.8 C)    TempSrc: Oral Oral    Resp: 19 19    Height:      Weight:  80.287 kg (177 lb)    SpO2: 96% 95%      Wt Readings from Last 3 Encounters:  01/29/13 80.287 kg (177 lb)  01/20/13 79.334 kg (174 lb 14.4 oz)  12/28/12 87.726 kg (193 lb 6.4 oz)     Intake/Output Summary (Last 24 hours) at 01/29/13 1019 Last data filed at 01/29/13 0700  Gross per 24 hour  Intake    240 ml  Output    125 ml  Net    115 ml    Exam Awake Alert, Oriented X 3, No new F.N deficits, Normal affect [place].AT,PERRAL Supple Neck,No JVD, No cervical  lymphadenopathy appriciated.  Symmetrical Chest wall movement, Good air movement bilaterally, CTAB RRR,No Gallops,Rubs or new Murmurs, No Parasternal Heave +ve B.Sounds, Abd Soft, Non tender, No organomegaly appriciated, No rebound - guarding or rigidity. No Cyanosis, Clubbing or edema, No new Rash or bruise      Data Review   Micro Results No results found for this or any previous visit (from the past 240 hour(s)).  Radiology Reports US Renal  01/13/2013   CLINICAL DATA:  Acute renal failure.  EXAM: RENAL/URINARY TRACT ULTRASOUND COMPLETE  COMPARISON:  CT 03/29/2008.  FINDINGS: Right Kidney  Length: 10.4. Tiny cortical echogenicity in the right upper renal pole may represent a parenchymal calcification or cysts with echogenic debris. 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  Left Kidney  Length: 10.7 cm long axis. Suboptimal visualization. No gross hydronephrosis.  Bladder  Appears normal for degree of bladder distention.  Left pleural effusion noted.  IMPRESSION: Negative for hydronephrosis or stone disease. Tiny echogenic focus in the right upper renal pole cortex probably represents a small echogenic cyst or parenchymal calcification.   Electronically Signed   By: Dereck Ligas M.D.   On: 01/13/2013 16:00   Dg Chest Port 1 View  01/12/2013   CLINICAL DATA:  42-year- female with nonproductive cough and pleural effusion. Initial encounter.  EXAM: PORTABLE CHEST - 1 VIEW  COMPARISON:  01/10/2013 and earlier.  FINDINGS: Portable AP upright view at 1017 hr. Small to moderate right pleural effusion probably not significantly changed allowing for differences in patient positioning. Continued increased interstitial opacity. No pneumothorax. Persistent left retrocardiac opacity but no definite left effusion. Stable postoperative changes to the left axilla. Stable cardiac size and mediastinal contours.  IMPRESSION: Small to moderate right pleural effusion not significantly  changed allowing for different patient positioning. Right greater than left lower lobe collapse or consolidation. Continued vascular congestion.   Electronically Signed   By: Lars Pinks M.D.   On: 01/12/2013 17:36   Dg Chest Port 1 View  01/10/2013   CLINICAL DATA:  Shortness of breath  EXAM: PORTABLE CHEST - 1 VIEW  COMPARISON:  11/14/2012  FINDINGS: Cardiac  shadow is stable. A moderate size right-sided pleural effusion is noted increased from the prior exam. Some mild interstitial changes are seen which may be related to mild vascular congestion.  IMPRESSION: Moderate right-sided pleural effusion new from the prior exam.  Mild vascular congestion.   Electronically Signed   By: Inez Catalina M.D.   On: 01/10/2013 07:35    CBC  Recent Labs Lab 01/27/13 1913 01/27/13 1947 01/28/13 0615  WBC 14.4*  --  7.7  HGB 12.6 13.9 10.8*  HCT 36.7 41.0 31.0*  PLT 172  --  151  MCV 93.9  --  91.7  MCH 32.2  --  32.0  MCHC 34.3  --  34.8  RDW 14.1  --  14.0  LYMPHSABS 2.2  --   --   MONOABS 1.1*  --   --   EOSABS 0.2  --   --   BASOSABS 0.0  --   --     Chemistries   Recent Labs Lab 01/27/13 1913 01/27/13 1947 01/28/13 0615 01/29/13 0540  NA 133* 135 140 137  K 4.0 3.7 3.1* 4.0  CL 89* 93* 98 101  CO2 26  --  28 26  GLUCOSE 416* 401* 236* 236*  BUN 67* 68* 61* 52*  CREATININE 3.82* 4.20* 3.22* 2.84*  CALCIUM 9.6  --  8.6 8.9  AST 21  --  15  --   ALT 22  --  16  --   ALKPHOS 93  --  62  --   BILITOT 0.5  --  0.4  --    ------------------------------------------------------------------------------------------------------------------ estimated creatinine clearance is 17.5 ml/min (by C-G formula based on Cr of 2.84). ------------------------------------------------------------------------------------------------------------------  Recent Labs  01/28/13 0930  HGBA1C 7.2*    ------------------------------------------------------------------------------------------------------------------ No results found for this basename: CHOL, HDL, LDLCALC, TRIG, CHOLHDL, LDLDIRECT,  in the last 72 hours ------------------------------------------------------------------------------------------------------------------  Recent Labs  01/28/13 0930  TSH 1.568   ------------------------------------------------------------------------------------------------------------------ No results found for this basename: VITAMINB12, FOLATE, FERRITIN, TIBC, IRON, RETICCTPCT,  in the last 72 hours  Coagulation profile  Recent Labs Lab 01/27/13 2059 01/28/13 0615 01/29/13 0540  INR 2.02* 2.74* 3.36*    No results found for this basename: DDIMER,  in the last 72 hours  Cardiac Enzymes No results found for this basename: CK, CKMB, TROPONINI, MYOGLOBIN,  in the last 168 hours ------------------------------------------------------------------------------------------------------------------ No components found with this basename: POCBNP,      Time Spent in minutes  35   Lala Lund K M.D on 01/29/2013 at 10:19 AM  Between 7am to 7pm - Pager - [phone]  After 7pm go to [email or web address] - password [email or web address]  And look for the night coverage person covering for me after hours  [facility] Office  [phone]

## 2013-01-29 NOTE — Progress Notes (Signed)
Patient ambulated with PT, HR IN 70'S while ambulating, reaching up to 83bpm. HR returned back to upper 50's - low 60's at rest. Jackie Alvarez R

## 2013-01-29 NOTE — Progress Notes (Signed)
Right AC PIV leaking and infiltrated. Elmarie Shiley RN and Tory Emerald RN assessed right arm and see no access. Left arm is restricted for access. Iv team paged.

## 2013-01-29 NOTE — Evaluation (Signed)
Physical Therapy Evaluation Patient Details Name: Jackie Alvarez MRN: [id number] DOB: [birth year] Today's Date: 01/29/2013 Time: [id number] PT Time Calculation (min): 14 min  PT Assessment / Plan / Recommendation History of Present Illness  Jackie Alvarez is a 79 y.o. female with Past medical history of diabetes mellitus, hypertension, peptic ulcer disease, COPD, recent admission for diastolic dysfunction, A. fib with RVR, recently started on Coumadin.  Patient had recent admission with abnormal labs and weakness/lethargy.  Since discharge from hospital, patient has had wrosening lethargy, weakness.  Noted bradycardia.  Clinical Impression  Patient is fairly independent with mobility and gait.  Has all equipment needed for home.  No acute PT needs identified - PT will sign off.  Recommended continued ambulation with nursing in hallway.    PT Assessment  Patent does not need any further PT services (Encouraged continued ambulation with nursing staff)    Follow Up Recommendations  No PT follow up;Supervision - Intermittent    Does the patient have the potential to tolerate intense rehabilitation      Barriers to Discharge Decreased caregiver support Son works during day    Equipment Recommendations  None recommended by PT    Recommendations for Other Services     Frequency      Precautions / Restrictions Precautions Precautions: Fall Restrictions Weight Bearing Restrictions: No   Pertinent Vitals/Pain       Mobility  Bed Mobility Bed Mobility: Supine to Sit;Sitting - Scoot to Edge of Bed;Sit to Supine Supine to Sit: 6: Modified independent (Device/Increase time);With rails;HOB flat Sitting - Scoot to Edge of Bed: 6: Modified independent (Device/Increase time);With rail Sit to Supine: 6: Modified independent (Device/Increase time);HOB flat Transfers Transfers: Sit to Stand;Stand to Sit Sit to Stand: 6: Modified independent (Device/Increase time);With upper extremity  assist;From bed Stand to Sit: 6: Modified independent (Device/Increase time);With upper extremity assist;To bed Details for Transfer Assistance: Demonstrates safe hand placement and use of RW. Ambulation/Gait Ambulation/Gait Assistance: 5: Supervision Ambulation Distance (Feet): 220 Feet Assistive device: Rolling walker Ambulation/Gait Assistance Details: Verbal cues to stay close to RW. Gait Pattern: Step-through pattern;Decreased stride length;Trunk flexed Gait velocity: Slow gait speed General Gait Details: HR increased to 83 during gait.       PT Goals(Current goals can be found in the care plan section)  N/A  Visit Information  Last PT Received On: 01/29/13 Assistance Needed: +1 History of Present Illness: Jackie Alvarez is a 79 y.o. female with Past medical history of diabetes mellitus, hypertension, peptic ulcer disease, COPD, recent admission for diastolic dysfunction, A. fib with RVR, recently started on Coumadin.  Patient had recent admission with abnormal labs and weakness/lethargy.  Since discharge from hospital, patient has had wrosening lethargy, weakness.  Noted bradycardia.       Prior [place] expects to be discharged to:: Private residence Living Arrangements: Children Available Help at Discharge: Family;Available PRN/intermittently (Son works during day) Type of Home: House Home Access: Stairs to enter Technical brewer of Steps: 3 Entrance Stairs-Rails: None Home Layout: One level Home Equipment: Marine scientist - single point;Walker - 2 wheels Prior Function Level of Independence: Independent with assistive device(s) Comments: Occasional uses walker  Communication Communication: No difficulties Dominant Hand: Right    Cognition  Cognition Arousal/Alertness: Awake/alert Behavior During Therapy: WFL for tasks assessed/performed Overall Cognitive Status: Within Functional Limits for tasks assessed    Extremity/Trunk  Assessment Upper Extremity Assessment Upper Extremity Assessment: Overall WFL for tasks assessed Lower Extremity Assessment Lower Extremity  Assessment: Generalized weakness   Balance    End of Session PT - End of Session Equipment Utilized During Treatment: Gait belt Activity Tolerance: Patient tolerated treatment well Patient left: in bed;with call bell/phone within reach Nurse Communication: Mobility status  GP     Despina Pole 01/29/2013, 4:40 PM Carita Pian. Sanjuana Kava, [place] Pager [phone]

## 2013-01-30 DIAGNOSIS — N19 Unspecified kidney failure: Secondary | ICD-10-CM | POA: Diagnosis not present

## 2013-01-30 LAB — PROTIME-INR
INR: 3.23 — ABNORMAL HIGH (ref 0.00–1.49)
Prothrombin Time: 31.8 seconds — ABNORMAL HIGH (ref 11.6–15.2)

## 2013-01-30 MED ORDER — WARFARIN SODIUM 2.5 MG PO TABS: 2.5000 mg | ORAL_TABLET | ORAL | Status: DC

## 2013-01-30 MED ORDER — HYDRALAZINE HCL 50 MG PO TABS: 50.0000 mg | ORAL_TABLET | Freq: Three times a day (TID) | ORAL | Status: DC

## 2013-01-30 NOTE — Progress Notes (Signed)
Assessment unchanged. IV and tele D/C. RN went over D/C instructions with pt. Pt verbalized understanding. Pt left via wheelchair accompanied by nursing tech and friend.  Coolidge Breeze, RN

## 2013-01-30 NOTE — Clinical Documentation Improvement (Signed)
Possible Clinical Conditions?   _______Diabetes Type   or 2 _______Controlled or uncontrolled  Manifestations:  _______DM retinopathy  _______DM PVD _______DM neuropathy   _______DM nephropathy  Associated conditions: _______DM cellulitis _______DM gangrene _______DM gastroparesis _______DM hyperosmolarity state _______DM ketoacidosis with or without coma _______DM osteomyelitis _______DM skin ulcer  _______Other Condition _______Cannot Clinically determine    Risk Factors: Diabetes mellitus noted per 11/22 H&P.  Diagnostics: 11/22: Hgb A1c: 7.2 Mean plasma glucose: 160 Glucose, capillary: 245 11/21: Glucose, capillay   Thank You, Theron Arista, Clinical Documentation Specialist:  [place] [employer]

## 2013-01-30 NOTE — Discharge Summary (Signed)
[facility]                                                                                   Jackie Alvarez, is a 79 y.o. female  DOB [birth year]  MRN [id number].  Admission date:  01/27/2013  Admitting Physician  Berle Mull, MD  Discharge Date:  01/30/2013   Primary MD  Jonathon Bellows, MD  Recommendations for primary care physician for things to follow:   Follow BMP, INR, H.Rate closely   Admission Diagnosis  hyperglycemia/kidney fxn  Discharge Diagnosis   Gen weakness, ARF, Bradycardia  Principal Problem:   Lethargy Active Problems:   COPD (chronic obstructive pulmonary disease) with emphysema   HTN (hypertension)   Chronic diastolic heart failure   Sinus bradycardia   Acute on chronic kidney failure   Paroxysmal atrial fibrillation   Hx of amiodarone therapy   Prolonged QT interval      Past Medical History  Diagnosis Date  . Diabetes mellitus   . Hypertension   . Hyperlipidemia   . Ulcer 2009    gatric with history of bleeding - resolved  . COPD (chronic obstructive pulmonary disease)   . Cancer 1994    Left Breast  . Carotid artery occlusion     mild bilateral  . Anxiety   . Central retinal vein occlusion, right eye   . Anemia   . Chronic kidney disease     CKD stage III  . Atrial flutter 2008    remote  . Thyroid disease     toxic multinodular goiter with history of thyrotoxicosis now s/p RAI ablative therapy - now hypothyroid  . Depression   . Peripheral neuropathy   . GERD (gastroesophageal reflux disease)   . Obesity   . Hiatal hernia   . Schatzki's ring   . Cholelithiases   . Chronic diastolic CHF (congestive heart failure)   . Aortic stenosis, mild     echo 12/2012  . Pulmonary HTN     mild PASP 30mmHg by echo 12/2012    Past Surgical History  Procedure Laterality Date  . Wrist surgery  02/2009  . Cholecystectomy      Gall Bladder  . Pr vein bypass graft,aorto-fem-pop    . Abdominal hysterectomy      with BSO  . Breast  lumpectomy    . Abdominal aortagram  Jan. 18, 2013  . Hip surgery Left      Discharge Condition: stable   Follow-up Information   Follow up with WEBB, CAROL, D, MD. Schedule an appointment as soon as possible for a visit in 3 days.   Specialty:  Family Medicine   Contact information:   [address] [place] [address] [phone]       Follow up with Ulla Potash., MD. Schedule an appointment as soon as possible for a visit in 1 week.   Specialty:  Nephrology   Contact information:   [address] [place] [address] [phone]       Follow up with Candee Furbish, MD. Schedule an appointment as soon as possible for a visit in 1 week.  Specialty:  Cardiology   Contact information:   [id number] [address] [phone]         [place]   Discharge Medications      Medication List    STOP taking these medications       amiodarone 200 MG tablet  Commonly known as:  PACERONE     diltiazem 240 MG 24 hr capsule  Commonly known as:  CARDIZEM CD     furosemide 40 MG tablet  Commonly known as:  LASIX     metoprolol 100 MG tablet  Commonly known as:  LOPRESSOR      TAKE these medications       amLODipine 10 MG tablet  Commonly known as:  NORVASC  Take 10 mg by mouth daily.     ANORO ELLIPTA 62.5-25 MCG/INH Aepb  Generic drug:  Umeclidinium-Vilanterol  Inhale 25 mcg into the lungs daily as needed (shortness of breath).     fish oil-omega-3 fatty acids 1000 MG capsule  Take 1 g by mouth 2 (two) times daily.     hydrALAZINE 50 MG tablet  Commonly known as:  APRESOLINE  Take 1 tablet (50 mg total) by mouth every 8 (eight) hours.     Iron 325 (65 FE) MG Tabs  Take 325 mg by mouth 3 (three) times daily.     levothyroxine 50 MCG tablet  Commonly known as:  SYNTHROID, LEVOTHROID  Take 50 mcg by mouth daily before breakfast.     mirtazapine 15 MG tablet   Commonly known as:  REMERON  Take 15 mg by mouth every morning.     omega-3 acid ethyl esters 1 G capsule  Commonly known as:  LOVAZA  Take 1 capsule (1 g total) by mouth 2 (two) times daily.     omeprazole 20 MG capsule  Commonly known as:  PRILOSEC  Take 20 mg by mouth daily.     OSCAL 500/200 D-3 500-200 MG-UNIT per tablet  Generic drug:  calcium-vitamin D  Take 1 tablet by mouth 2 (two) times daily.     pioglitazone 30 MG tablet  Commonly known as:  ACTOS  Take 30 mg by mouth daily.     simvastatin 20 MG tablet  Commonly known as:  ZOCOR  Take 20 mg by mouth at bedtime.     sitaGLIPtin 100 MG tablet  Commonly known as:  JANUVIA  Take 50 mg by mouth daily.     warfarin 2.5 MG tablet  Commonly known as:  COUMADIN  Take 2.5 mg by mouth as directed.         Diet and Activity recommendation: See Discharge Instructions below   Discharge Instructions     Follow with Primary MD Maurice Small, D, MD in 3 days   Get CBC, CMP, INR checked 3 days by Primary MD and again as instructed by your Primary MD.    Get Medicines reviewed and adjusted.  Please request your Prim.MD to go over all Hospital Tests and Procedure/Radiological results at the follow up, please get all Hospital records sent to your Prim MD by signing hospital release before you go home.  Activity: As tolerated with Full fall precautions use walker/cane & assistance as needed   Diet:  Heart Healthy - Low carb  Check your Weight same time everyday, if you gain over 2 pounds, or you develop in leg swelling, experience more shortness of breath or chest pain, call your Primary MD  immediately. Follow Cardiac Low Salt Diet and 1.8 lit/day fluid restriction.  Disposition Home    If you experience worsening of your admission symptoms, develop shortness of breath, life threatening emergency, suicidal or homicidal thoughts you must seek medical attention immediately by calling 911 or calling your MD immediately   if symptoms less severe.  You Must read complete instructions/literature along with all the possible adverse reactions/side effects for all the Medicines you take and that have been prescribed to you. Take any new Medicines after you have completely understood and accpet all the possible adverse reactions/side effects.   Do not drive and provide baby sitting services if your were admitted for syncope or siezures until you have seen by Primary MD or a Neurologist and advised to do so again.  Do not drive when taking Pain medications.    Do not take more than prescribed Pain, Sleep and Anxiety Medications  Special Instructions: If you have smoked or chewed Tobacco  in the last 2 yrs please stop smoking, stop any regular Alcohol  and or any Recreational drug use.  Wear Seat belts while driving.   Please note  You were cared for by a hospitalist during your hospital stay. If you have any questions about your discharge medications or the care you received while you were in the hospital after you are discharged, you can call the unit and asked to speak with the hospitalist on call if the hospitalist that took care of you is not available. Once you are discharged, your primary care physician will handle any further medical issues. Please note that NO REFILLS for any discharge medications will be authorized once you are discharged, as it is imperative that you return to your primary care physician (or establish a relationship with a primary care physician if you do not have one) for your aftercare needs so that they can reassess your need for medications and monitor your lab values.    Major procedures and Radiology Reports - PLEASE review detailed and final reports for all details, in brief -      Micro Results      No results found for this or any previous visit (from the past 240 hour(s)).   History of present illness and  Hospital Course:     Kindly see H&P for history of present  illness and admission details, please review complete Labs, Consult reports and Test reports for all details in brief Jackie Alvarez, is a 79 y.o. female, patient with history of  With H/O Afib on coumadin was on Lopressor, Cardiazem ,Amio and Coumadin, DM-2, Chr.Diastolic CHF, Hypothyroidism was admitted for Gen weakness due to combination of Pre Renal ARF on CKD 3 and Bradycardia.    1. Generalized weakness due to combination of acute renal insufficiency and severe bradycardia.- Blood pressures have been stable, good chonotropic response with activity and now at rest, stopped all rate controlling agents which include beta blocker, Cardizem and amiodarone. Followed by cardiology who recommended outpt follow with Primary cards. Her heart rate and overall feeling of fatigue are much improved today.      2. 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 Baseline creatinine recently has been around 3.5, however in January it was close to 1.5, improved post IVF, Lasix held, monitor BMP claosely.   3. Atrial fibrillation. For now all rate controlling agents held due to #1 above,  Coumadin skip todays dose. INR monitor by PCP. Outpt follow up by Cards, was seen here by them  Lab  Results  Component Value Date   INR 3.23* 01/30/2013   INR 3.36* 01/29/2013   INR 2.74* 01/28/2013       4. Chronic grade 2 diastolic CHF. EF greater than 60% in October 2014, currently completely compensated. Monitor clinically.     5. Hypertension. Change to Norvasc and hydralazine, rate controlling agents held, stable.      6. Diabetes mellitus type 2. Resume Home regimen, Low carb diet   Lab Results   Component  Value  Date    HGBA1C  7.2*  01/28/2013    CBG (last 3)   Recent Labs  01/29/13 1611 01/29/13 2053 01/30/13 0626  GLUCAP 169* 148* 192*      7. Hypothyroidism.  TSH stable continue present dose Synthroid.    Lab Results   Component  Value  Date    TSH  1.568  01/28/2013      8. Hypokalemia.  Replaced and stable.             Today   Subjective:   Soleil [place] today has no headache,no chest abdominal pain,no new weakness tingling or numbness, feels much better wants to go home today.   Objective:   Blood pressure 153/44, pulse 72, temperature 98.9 F (37.2 C), temperature source Oral, resp. rate 18, height 5\' 7"  (1.702 m), weight 81.285 kg (179 lb 3.2 oz), SpO2 98.00%.   Intake/Output Summary (Last 24 hours) at 01/30/13 0918 Last data filed at 01/30/13 [id number]  Gross per 24 hour  Intake 1224.17 ml  Output    300 ml  Net 924.17 ml    Exam Awake Alert, Oriented *3, No new F.N deficits, Normal affect [place].AT,PERRAL Supple Neck,No JVD, No cervical lymphadenopathy appriciated.  Symmetrical Chest wall movement, Good air movement bilaterally, CTAB RRR,No Gallops,Rubs or new Murmurs, No Parasternal Heave +ve B.Sounds, Abd Soft, Non tender, No organomegaly appriciated, No rebound -guarding or rigidity. No Cyanosis, Clubbing or edema, No new Rash or bruise  Data Review   CBC w Diff: Lab Results  Component Value Date   WBC 7.7 01/28/2013   WBC 4.5 11/14/2012   HGB 10.8* 01/28/2013   HGB 9.3* 11/14/2012   HCT 31.0* 01/28/2013   HCT 28.6* 11/14/2012   PLT 151 01/28/2013   PLT 121* 11/14/2012   LYMPHOPCT 15 01/27/2013   LYMPHOPCT 21.1 11/14/2012   MONOPCT 7 01/27/2013   MONOPCT 4.4 11/14/2012   EOSPCT 1 01/27/2013   EOSPCT 4.0 11/14/2012   BASOPCT 0 01/27/2013   BASOPCT 0.2 11/14/2012    CMP: Lab Results  Component Value Date   NA 137 01/29/2013   NA 149* 11/14/2012   K 4.0 01/29/2013   K 3.6 11/14/2012   CL 101 01/29/2013   CO2 26 01/29/2013   CO2 30* 11/14/2012   BUN 52* 01/29/2013   BUN 30.3* 11/14/2012   CREATININE 2.84* 01/29/2013   CREATININE 1.7* 11/14/2012   PROT 5.7* 01/28/2013   PROT 6.6 11/14/2012   ALBUMIN 3.1* 01/28/2013   ALBUMIN 3.4* 11/14/2012   BILITOT 0.4 01/28/2013   BILITOT 0.35 11/14/2012   ALKPHOS 62 01/28/2013   ALKPHOS 65 11/14/2012   AST 15 01/28/2013    AST 20 11/14/2012   ALT 16 01/28/2013   ALT 19 11/14/2012  .   Total Time in preparing paper work, data evaluation and todays exam - 35 minutes  Thurnell Lose M.D on 01/30/2013 at [place]  [phone]

## 2013-01-30 NOTE — Care Management Note (Signed)
    Page 1 of 1   01/30/2013     4:35:38 PM   CARE MANAGEMENT NOTE 01/30/2013  Patient:  Jackie Alvarez, Jackie Alvarez   Account Number:  [id number]  Date Initiated:  01/30/2013  Documentation initiated by:  Cortny Bambach  Subjective/Objective Assessment:   PT ACTIVE WITH [facility] PRIOR TO ADMISSION ON 01/27/13.     Action/Plan:   RESUMPTION ORDERS WRITTEN FOR HH CARE BY MD; NOTIFIED [place].   Anticipated DC Date:  01/30/2013   Anticipated DC Plan:  [place]  CM consult      [facility] Choice  Resumption Of Svcs/PTA Provider   Choice offered to / List presented to:  C-1 Patient        [place] arranged  [facility]      [place].   Status of service:  Completed, signed off Medicare Important Message given?   (If response is "NO", the following Medicare IM given date fields will be blank) Date Medicare IM given:   Date Additional Medicare IM given:    Discharge Disposition:  [place]  Per UR Regulation:  Reviewed for med. necessity/level of care/duration of stay  If discussed at [place] of Stay Meetings, dates discussed:    Comments:

## 2013-01-30 NOTE — Progress Notes (Addendum)
ANTICOAGULATION CONSULT NOTE - follow up Pharmacy Consult for Coumadin Indication: atrial fibrillation  Allergies  Allergen Reactions  . Metformin And Related     Renal insuff  . Omnipaque [Iohexol] Nausea And Vomiting    unknown    Patient Measurements: Height: 5\' 7"  (170.2 cm) Weight: 179 lb 3.2 oz (81.285 kg) (scale A) IBW/kg (Calculated) : 61.6  Vital Signs: Temp: 98.9 F (37.2 C) (11/24 0426) Temp src: Oral (11/24 0426) BP: 153/44 mmHg (11/24 0426) Pulse Rate: 72 (11/24 0426)  Labs:  Recent Labs  01/27/13 1913 01/27/13 1947  01/28/13 0615 01/29/13 0540 01/30/13 0330  HGB 12.6 13.9  --  10.8*  --   --   HCT 36.7 41.0  --  31.0*  --   --   PLT 172  --   --  151  --   --   LABPROT  --   --   < > 28.1* 32.8* 31.8*  INR  --   --   < > 2.74* 3.36* 3.23*  CREATININE 3.82* 4.20*  --  3.22* 2.84*  --   < > = values in this interval not displayed.  Estimated Creatinine Clearance: 17.6 ml/min (by C-G formula based on Cr of 2.84).  Assessment: 79yo female presented to ED on 11/21 from home after call from PCP stating blood sugar and renal labs were "bad", had recent admission for bradycardia, to continue Coumadin during admission for further workup; admitted with therapeutic INR of 2.02.  Her home dose is 2.5 mg daily with last dose PTA 01/26/13.  INR today is 3.23 after coumadin was held yesterday.   Goal of Therapy:  INR 2-3   Plan:  Discussed with Dr. Candiss Norse.  He plans to discharge her today and told her to skip coumadin today and resume 2.5 mg tomorrow with close INR f/u w/ PCP.  She was previously on amio 200 bid.  Now to stop taking amio vs decrease dose - to be determined by her primary cardiologist.  Eudelia Bunch, Pharm.D. QP:[id number] 01/30/2013 9:24 AM

## 2013-01-31 DIAGNOSIS — E1129 Type 2 diabetes mellitus with other diabetic kidney complication: Secondary | ICD-10-CM | POA: Diagnosis not present

## 2013-01-31 DIAGNOSIS — I4891 Unspecified atrial fibrillation: Secondary | ICD-10-CM | POA: Diagnosis not present

## 2013-01-31 DIAGNOSIS — I5032 Chronic diastolic (congestive) heart failure: Secondary | ICD-10-CM | POA: Diagnosis not present

## 2013-01-31 DIAGNOSIS — IMO0001 Reserved for inherently not codable concepts without codable children: Secondary | ICD-10-CM | POA: Diagnosis not present

## 2013-01-31 DIAGNOSIS — I509 Heart failure, unspecified: Secondary | ICD-10-CM | POA: Diagnosis not present

## 2013-02-01 DIAGNOSIS — I10 Essential (primary) hypertension: Secondary | ICD-10-CM | POA: Diagnosis not present

## 2013-02-01 DIAGNOSIS — I4891 Unspecified atrial fibrillation: Secondary | ICD-10-CM | POA: Diagnosis not present

## 2013-02-01 DIAGNOSIS — E7800 Pure hypercholesterolemia, unspecified: Secondary | ICD-10-CM | POA: Diagnosis not present

## 2013-02-01 DIAGNOSIS — Z09 Encounter for follow-up examination after completed treatment for conditions other than malignant neoplasm: Secondary | ICD-10-CM | POA: Diagnosis not present

## 2013-02-01 DIAGNOSIS — D649 Anemia, unspecified: Secondary | ICD-10-CM | POA: Diagnosis not present

## 2013-02-01 DIAGNOSIS — Z5181 Encounter for therapeutic drug level monitoring: Secondary | ICD-10-CM | POA: Diagnosis not present

## 2013-02-01 DIAGNOSIS — T148XXA Other injury of unspecified body region, initial encounter: Secondary | ICD-10-CM | POA: Diagnosis not present

## 2013-02-07 DIAGNOSIS — IMO0001 Reserved for inherently not codable concepts without codable children: Secondary | ICD-10-CM | POA: Diagnosis not present

## 2013-02-07 DIAGNOSIS — I4891 Unspecified atrial fibrillation: Secondary | ICD-10-CM | POA: Diagnosis not present

## 2013-02-07 DIAGNOSIS — E1129 Type 2 diabetes mellitus with other diabetic kidney complication: Secondary | ICD-10-CM | POA: Diagnosis not present

## 2013-02-07 DIAGNOSIS — I5032 Chronic diastolic (congestive) heart failure: Secondary | ICD-10-CM | POA: Diagnosis not present

## 2013-02-07 DIAGNOSIS — I509 Heart failure, unspecified: Secondary | ICD-10-CM | POA: Diagnosis not present

## 2013-02-08 ENCOUNTER — Ambulatory Visit (INDEPENDENT_AMBULATORY_CARE_PROVIDER_SITE_OTHER): Payer: Medicare Other | Admitting: Cardiology

## 2013-02-08 ENCOUNTER — Encounter: Payer: Self-pay | Admitting: Cardiology

## 2013-02-08 VITALS — BP 152/51 | HR 91 | Ht 67.0 in | Wt 180.0 lb

## 2013-02-08 DIAGNOSIS — I4891 Unspecified atrial fibrillation: Secondary | ICD-10-CM | POA: Diagnosis not present

## 2013-02-08 DIAGNOSIS — R609 Edema, unspecified: Secondary | ICD-10-CM | POA: Diagnosis not present

## 2013-02-08 DIAGNOSIS — I350 Nonrheumatic aortic (valve) stenosis: Secondary | ICD-10-CM

## 2013-02-08 DIAGNOSIS — I5032 Chronic diastolic (congestive) heart failure: Secondary | ICD-10-CM | POA: Diagnosis not present

## 2013-02-08 DIAGNOSIS — I359 Nonrheumatic aortic valve disorder, unspecified: Secondary | ICD-10-CM

## 2013-02-08 DIAGNOSIS — R600 Localized edema: Secondary | ICD-10-CM

## 2013-02-08 DIAGNOSIS — I480 Paroxysmal atrial fibrillation: Secondary | ICD-10-CM

## 2013-02-08 DIAGNOSIS — I2789 Other specified pulmonary heart diseases: Secondary | ICD-10-CM

## 2013-02-08 DIAGNOSIS — I272 Pulmonary hypertension, unspecified: Secondary | ICD-10-CM

## 2013-02-08 DIAGNOSIS — I10 Essential (primary) hypertension: Secondary | ICD-10-CM

## 2013-02-08 DIAGNOSIS — Z7901 Long term (current) use of anticoagulants: Secondary | ICD-10-CM | POA: Diagnosis not present

## 2013-02-08 NOTE — Progress Notes (Signed)
[address] [place], [place]  [address] Phone: [phone] Fax:  [phone]  Date:  02/08/2013   ID:  BRITTNEI STELLAR, DOB [birth year], MRN [id number]  PCP:  Jonathon Bellows, MD  Cardiologist:  Fransico Him, MD     History of Present Illness: Jackie Alvarez is a 79 y.o. female with a history of chronic diastolic CHF, HTN, CKD stage III, remote atrial flutter and mild AS who was hospitalized in early November with acute on chronic renal failure complicated by acute diastolic CHF and atrial fibrillation.  She was also treated for a UTI.  She was discharged on 11/14 and then readmitted on 11/21 for worsening renal function and increased lethargy.  She was bradycardic with HRs in the 40's in NSR.  Her beta blocker and amio were stopped.  Her diuretics were held and she was gently rehydrated.  She now presents today for followup. She feels pretty good except for being weak.  SHe denies any SOB and has minimal LE edema.  When she left the hospital she weighed 179lbs.  She denies any palpitations, dizziness or syncope.    Wt Readings from Last 3 Encounters:  01/30/13 179 lb 3.2 oz (81.285 kg)  01/20/13 174 lb 14.4 oz (79.334 kg)  12/28/12 193 lb 6.4 oz (87.726 kg)     Past Medical History  Diagnosis Date  . Diabetes mellitus   . Hypertension   . Hyperlipidemia   . Ulcer 2009    gatric with history of bleeding - resolved  . COPD (chronic obstructive pulmonary disease)   . Cancer 1994    Left Breast  . Carotid artery occlusion     mild bilateral  . Anxiety   . Central retinal vein occlusion, right eye   . Anemia   . Chronic kidney disease     CKD stage III  . Atrial flutter 2008    remote  . Thyroid disease     toxic multinodular goiter with history of thyrotoxicosis now s/p RAI ablative therapy - now hypothyroid  . Depression   . Peripheral neuropathy   . GERD (gastroesophageal reflux disease)   . Obesity   . Hiatal hernia   . Schatzki's ring   . Cholelithiases   .  Chronic diastolic CHF (congestive heart failure)   . Aortic stenosis, mild     echo 12/2012  . Pulmonary HTN     mild PASP 51mmHg by echo 12/2012    Current Outpatient Prescriptions  Medication Sig Dispense Refill  . amLODipine (NORVASC) 10 MG tablet Take 10 mg by mouth daily.        . calcium-vitamin D (OSCAL 500/200 D-3) 500-200 MG-UNIT per tablet Take 1 tablet by mouth 2 (two) times daily.        . Ferrous Sulfate (IRON) 325 (65 FE) MG TABS Take 325 mg by mouth 3 (three) times daily.       . fish oil-omega-3 fatty acids 1000 MG capsule Take 1 g by mouth 2 (two) times daily.        . hydrALAZINE (APRESOLINE) 50 MG tablet Take 1 tablet (50 mg total) by mouth every 8 (eight) hours.  90 tablet  0  . levothyroxine (SYNTHROID, LEVOTHROID) 50 MCG tablet Take 50 mcg by mouth daily before breakfast.      . mirtazapine (REMERON) 15 MG tablet Take 15 mg by mouth every morning.       Marland Kitchen omega-3 acid ethyl esters (LOVAZA) 1 G capsule  Take 1 capsule (1 g total) by mouth 2 (two) times daily.  60 capsule  0  . omeprazole (PRILOSEC) 20 MG capsule Take 20 mg by mouth daily.        . pioglitazone (ACTOS) 30 MG tablet Take 30 mg by mouth daily.      . simvastatin (ZOCOR) 20 MG tablet Take 20 mg by mouth at bedtime.        . sitaGLIPtin (JANUVIA) 100 MG tablet Take 50 mg by mouth daily.        Marland Kitchen Umeclidinium-Vilanterol (ANORO ELLIPTA) 62.5-25 MCG/INH AEPB Inhale 25 mcg into the lungs daily as needed (shortness of breath).      . warfarin (COUMADIN) 2.5 MG tablet Take 1 tablet (2.5 mg total) by mouth as directed.       No current facility-administered medications for this visit.    Allergies:    Allergies  Allergen Reactions  . Metformin And Related     Renal insuff  . Omnipaque [Iohexol] Nausea And Vomiting    unknown    Social History:  The patient  reports that she quit smoking about 16 years ago. Her smoking use included Cigarettes. She has a 45 pack-year smoking history. She has never used  smokeless tobacco. She reports that she does not drink alcohol or use illicit drugs.   Family History:  The patient's family history includes Cancer in her father, mother, and sister; Cirrhosis in her father.   ROS:  Please see the history of present illness.      All other systems reviewed and negative.   PHYSICAL EXAM: VS:  There were no vitals taken for this visit. Well nourished, well developed, in no acute distress HEENT: normal Neck: no JVD Cardiac:  normal S1, S2; RRR; no murmur Lungs:  clear to auscultation bilaterally, no wheezing, rhonchi or rales Abd: soft, nontender, no hepatomegaly Ext: no edema Skin: warm and dry Neuro:  CNs 2-12 intact, no focal abnormalities noted  EKG:    ASSESSMENT AND PLAN:  1. Chronic diastolic CHF - she has some mild LE edema but her weight is stable from d/c from hospital and I am reluctant to restart diuretics back given recent renal failure.  She is to see Dr. Mercy Moore next week so I will let him reassess at that time. 2. HTN - mildly elevated but she said that she had to walk far to get to the office but at home it runs 134/82mmHg. 3. PAF - maintaining NSR  - continue Warfarin 4. Mild AS by recent echo 5. Mild pulmonary HTN by recent echo  Followup with me in 6 [place]  Signed, Fransico Him, MD 02/08/2013 11:19 AM

## 2013-02-08 NOTE — Patient Instructions (Signed)
Your physician recommends that you continue on your current medications as directed. Please refer to the Current Medication list given to you today.  Your physician recommends that you schedule a follow-up appointment in: 6 [place] with Dr [name]

## 2013-02-09 DIAGNOSIS — I4891 Unspecified atrial fibrillation: Secondary | ICD-10-CM | POA: Diagnosis not present

## 2013-02-09 DIAGNOSIS — IMO0001 Reserved for inherently not codable concepts without codable children: Secondary | ICD-10-CM | POA: Diagnosis not present

## 2013-02-09 DIAGNOSIS — I5032 Chronic diastolic (congestive) heart failure: Secondary | ICD-10-CM | POA: Diagnosis not present

## 2013-02-09 DIAGNOSIS — E1129 Type 2 diabetes mellitus with other diabetic kidney complication: Secondary | ICD-10-CM | POA: Diagnosis not present

## 2013-02-09 DIAGNOSIS — I509 Heart failure, unspecified: Secondary | ICD-10-CM | POA: Diagnosis not present

## 2013-02-13 ENCOUNTER — Ambulatory Visit: Payer: Medicare Other | Admitting: Cardiology

## 2013-02-13 DIAGNOSIS — I5032 Chronic diastolic (congestive) heart failure: Secondary | ICD-10-CM | POA: Diagnosis not present

## 2013-02-13 DIAGNOSIS — I509 Heart failure, unspecified: Secondary | ICD-10-CM | POA: Diagnosis not present

## 2013-02-13 DIAGNOSIS — IMO0001 Reserved for inherently not codable concepts without codable children: Secondary | ICD-10-CM | POA: Diagnosis not present

## 2013-02-13 DIAGNOSIS — E1129 Type 2 diabetes mellitus with other diabetic kidney complication: Secondary | ICD-10-CM | POA: Diagnosis not present

## 2013-02-13 DIAGNOSIS — I4891 Unspecified atrial fibrillation: Secondary | ICD-10-CM | POA: Diagnosis not present

## 2013-02-14 DIAGNOSIS — I129 Hypertensive chronic kidney disease with stage 1 through stage 4 chronic kidney disease, or unspecified chronic kidney disease: Secondary | ICD-10-CM | POA: Diagnosis not present

## 2013-02-14 DIAGNOSIS — D631 Anemia in chronic kidney disease: Secondary | ICD-10-CM | POA: Diagnosis not present

## 2013-02-14 DIAGNOSIS — N058 Unspecified nephritic syndrome with other morphologic changes: Secondary | ICD-10-CM | POA: Diagnosis not present

## 2013-02-15 DIAGNOSIS — I5032 Chronic diastolic (congestive) heart failure: Secondary | ICD-10-CM | POA: Diagnosis not present

## 2013-02-15 DIAGNOSIS — E1129 Type 2 diabetes mellitus with other diabetic kidney complication: Secondary | ICD-10-CM | POA: Diagnosis not present

## 2013-02-15 DIAGNOSIS — IMO0001 Reserved for inherently not codable concepts without codable children: Secondary | ICD-10-CM | POA: Diagnosis not present

## 2013-02-15 DIAGNOSIS — I509 Heart failure, unspecified: Secondary | ICD-10-CM | POA: Diagnosis not present

## 2013-02-15 DIAGNOSIS — I4891 Unspecified atrial fibrillation: Secondary | ICD-10-CM | POA: Diagnosis not present

## 2013-02-16 DIAGNOSIS — I4891 Unspecified atrial fibrillation: Secondary | ICD-10-CM | POA: Diagnosis not present

## 2013-02-16 DIAGNOSIS — Z7901 Long term (current) use of anticoagulants: Secondary | ICD-10-CM | POA: Diagnosis not present

## 2013-02-20 DIAGNOSIS — IMO0001 Reserved for inherently not codable concepts without codable children: Secondary | ICD-10-CM | POA: Diagnosis not present

## 2013-02-20 DIAGNOSIS — E1129 Type 2 diabetes mellitus with other diabetic kidney complication: Secondary | ICD-10-CM | POA: Diagnosis not present

## 2013-02-20 DIAGNOSIS — I4891 Unspecified atrial fibrillation: Secondary | ICD-10-CM | POA: Diagnosis not present

## 2013-02-20 DIAGNOSIS — I5032 Chronic diastolic (congestive) heart failure: Secondary | ICD-10-CM | POA: Diagnosis not present

## 2013-02-20 DIAGNOSIS — I509 Heart failure, unspecified: Secondary | ICD-10-CM | POA: Diagnosis not present

## 2013-02-22 DIAGNOSIS — IMO0001 Reserved for inherently not codable concepts without codable children: Secondary | ICD-10-CM | POA: Diagnosis not present

## 2013-02-22 DIAGNOSIS — I509 Heart failure, unspecified: Secondary | ICD-10-CM | POA: Diagnosis not present

## 2013-02-22 DIAGNOSIS — I4891 Unspecified atrial fibrillation: Secondary | ICD-10-CM | POA: Diagnosis not present

## 2013-02-22 DIAGNOSIS — E1129 Type 2 diabetes mellitus with other diabetic kidney complication: Secondary | ICD-10-CM | POA: Diagnosis not present

## 2013-02-22 DIAGNOSIS — I5032 Chronic diastolic (congestive) heart failure: Secondary | ICD-10-CM | POA: Diagnosis not present

## 2013-02-23 DIAGNOSIS — Z7901 Long term (current) use of anticoagulants: Secondary | ICD-10-CM | POA: Diagnosis not present

## 2013-02-23 DIAGNOSIS — I4892 Unspecified atrial flutter: Secondary | ICD-10-CM | POA: Diagnosis not present

## 2013-02-28 ENCOUNTER — Ambulatory Visit (INDEPENDENT_AMBULATORY_CARE_PROVIDER_SITE_OTHER): Payer: Medicare Other

## 2013-02-28 VITALS — BP 148/65 | HR 79 | Resp 12

## 2013-02-28 DIAGNOSIS — L608 Other nail disorders: Secondary | ICD-10-CM | POA: Diagnosis not present

## 2013-02-28 DIAGNOSIS — I739 Peripheral vascular disease, unspecified: Secondary | ICD-10-CM | POA: Diagnosis not present

## 2013-02-28 DIAGNOSIS — E1142 Type 2 diabetes mellitus with diabetic polyneuropathy: Secondary | ICD-10-CM | POA: Diagnosis not present

## 2013-02-28 DIAGNOSIS — E1149 Type 2 diabetes mellitus with other diabetic neurological complication: Secondary | ICD-10-CM

## 2013-02-28 DIAGNOSIS — Q828 Other specified congenital malformations of skin: Secondary | ICD-10-CM

## 2013-02-28 DIAGNOSIS — E1140 Type 2 diabetes mellitus with diabetic neuropathy, unspecified: Secondary | ICD-10-CM

## 2013-02-28 NOTE — Patient Instructions (Signed)
Diabetes and Foot Care Diabetes may cause you to have problems because of poor blood supply (circulation) to your feet and legs. This may cause the skin on your feet to become thinner, break easier, and heal more slowly. Your skin may become dry, and the skin may peel and crack. You may also have nerve damage in your legs and feet causing decreased feeling in them. You may not notice minor injuries to your feet that could lead to infections or more serious problems. Taking care of your feet is one of the most important things you can do for yourself.  HOME CARE INSTRUCTIONS  Wear shoes at all times, even in the house. Do not go barefoot. Bare feet are easily injured.  Check your feet daily for blisters, cuts, and redness. If you cannot see the bottom of your feet, use a mirror or ask someone for help.  Wash your feet with warm water (do not use hot water) and mild soap. Then pat your feet and the areas between your toes until they are completely dry. Do not soak your feet as this can dry your skin.  Apply a moisturizing lotion or petroleum jelly (that does not contain alcohol and is unscented) to the skin on your feet and to dry, brittle toenails. Do not apply lotion between your toes.  Trim your toenails straight across. Do not dig under them or around the cuticle. File the edges of your nails with an emery board or nail file.  Do not cut corns or calluses or try to remove them with medicine.  Wear clean socks or stockings every day. Make sure they are not too tight. Do not wear knee-high stockings since they may decrease blood flow to your legs.  Wear shoes that fit properly and have enough cushioning. To break in new shoes, wear them for just a few hours a day. This prevents you from injuring your feet. Always look in your shoes before you put them on to be sure there are no objects inside.  Do not cross your legs. This may decrease the blood flow to your feet.  If you find a minor scrape,  cut, or break in the skin on your feet, keep it and the skin around it clean and dry. These areas may be cleansed with mild soap and water. Do not cleanse the area with peroxide, alcohol, or iodine.  When you remove an adhesive bandage, be sure not to damage the skin around it.  If you have a wound, look at it several times a day to make sure it is healing.  Do not use heating pads or hot water bottles. They may burn your skin. If you have lost feeling in your feet or legs, you may not know it is happening until it is too late.  Make sure your health care provider performs a complete foot exam at least annually or more often if you have foot problems. Report any cuts, sores, or bruises to your health care provider immediately. SEEK MEDICAL CARE IF:   You have an injury that is not healing.  You have cuts or breaks in the skin.  You have an ingrown nail.  You notice redness on your legs or feet.  You feel burning or tingling in your legs or feet.  You have pain or cramps in your legs and feet.  Your legs or feet are numb.  Your feet always feel cold. SEEK IMMEDIATE MEDICAL CARE IF:   There is increasing redness,   swelling, or pain in or around a wound.  There is a red line that goes up your leg.  Pus is coming from a wound.  You develop a fever or as directed by your health care provider.  You notice a bad smell coming from an ulcer or wound. Document Released: 02/21/2000 Document Revised: 10/26/2012 Document Reviewed: 08/02/2012 [employer] Patient Information 2014 [employer].  

## 2013-02-28 NOTE — Progress Notes (Signed)
   Subjective:    Patient ID: Jackie Alvarez, female    DOB: 09-May-1931, 79 y.o.   MRN: [id number]  HPI Comments: TOENAILS TRIM AND CALLUS CHECK.''     Review of Systems deferred at this visit     Objective:   Physical Exam Vascular status is intact although diminished pedal pulses as follows dorsalis pedis thready pulse one over 4 bilateral PT nonpalpable bilateral. Patient has a history PDD history partial amputation second toe left distal hallux left. History of diabetes with complications in neuropathy. Within the past month her back in October patient was hospitalized 2 occasions with the degrees a sugar renal failure as well as some heart failure and decrease blood pressure heart rate. Patient is now currently on Coumadin has results of those hospitalizations. Neurologically epicritic and progressive sensations grossly diminished on Semmes Weinstein testing orthopedic biomechanical exam remarkable for previous partial amputations left foot does have arthropathy with digital contractures all toes 25 right 34 and 5 left. Dermatologically skin color pigment normal hair growth absent dry skin is noted patient lives lotion daily is wearing appropriate accommodative shoes as instructed.       Assessment & Plan:  Assessment this time his diabetes with complications peripheral neuropathy and angiopathy. At this time debridement of nails x91 through 5 right one 34 and 5 left. Following debridement of left hallux nail there is some pinpoint bleeding and this time is to with lumicain and skin Neosporin and Band-Aid dressing. Patient maintaining Neosporin and Band-Aid at home as instructed contact us if any difficulties or excessive bleeding occur. The remaining nails are debrided also multiple keratoses pinch callus first left is debrided. Followup in the future for palliative nail care and as-needed basis suggest a 3 month followup  Harriet Masson DPM

## 2013-03-07 DIAGNOSIS — E1149 Type 2 diabetes mellitus with other diabetic neurological complication: Secondary | ICD-10-CM | POA: Diagnosis not present

## 2013-03-07 DIAGNOSIS — I10 Essential (primary) hypertension: Secondary | ICD-10-CM | POA: Diagnosis not present

## 2013-03-07 DIAGNOSIS — I4891 Unspecified atrial fibrillation: Secondary | ICD-10-CM | POA: Diagnosis not present

## 2013-03-07 DIAGNOSIS — Z7901 Long term (current) use of anticoagulants: Secondary | ICD-10-CM | POA: Diagnosis not present

## 2013-03-07 DIAGNOSIS — E039 Hypothyroidism, unspecified: Secondary | ICD-10-CM | POA: Diagnosis not present

## 2013-03-07 DIAGNOSIS — F329 Major depressive disorder, single episode, unspecified: Secondary | ICD-10-CM | POA: Diagnosis not present

## 2013-03-14 DIAGNOSIS — I4892 Unspecified atrial flutter: Secondary | ICD-10-CM | POA: Diagnosis not present

## 2013-03-14 DIAGNOSIS — Z7901 Long term (current) use of anticoagulants: Secondary | ICD-10-CM | POA: Diagnosis not present

## 2013-03-21 DIAGNOSIS — Z7901 Long term (current) use of anticoagulants: Secondary | ICD-10-CM | POA: Diagnosis not present

## 2013-03-21 DIAGNOSIS — I4892 Unspecified atrial flutter: Secondary | ICD-10-CM | POA: Diagnosis not present

## 2013-03-22 ENCOUNTER — Ambulatory Visit: Payer: Medicare Other | Admitting: Cardiology

## 2013-03-23 DIAGNOSIS — R809 Proteinuria, unspecified: Secondary | ICD-10-CM | POA: Diagnosis not present

## 2013-03-23 DIAGNOSIS — N2581 Secondary hyperparathyroidism of renal origin: Secondary | ICD-10-CM | POA: Diagnosis not present

## 2013-03-23 DIAGNOSIS — D631 Anemia in chronic kidney disease: Secondary | ICD-10-CM | POA: Diagnosis not present

## 2013-03-23 DIAGNOSIS — N183 Chronic kidney disease, stage 3 unspecified: Secondary | ICD-10-CM | POA: Diagnosis not present

## 2013-03-23 DIAGNOSIS — I129 Hypertensive chronic kidney disease with stage 1 through stage 4 chronic kidney disease, or unspecified chronic kidney disease: Secondary | ICD-10-CM | POA: Diagnosis not present

## 2013-03-23 DIAGNOSIS — N039 Chronic nephritic syndrome with unspecified morphologic changes: Secondary | ICD-10-CM | POA: Diagnosis not present

## 2013-03-28 DIAGNOSIS — I4892 Unspecified atrial flutter: Secondary | ICD-10-CM | POA: Diagnosis not present

## 2013-03-28 DIAGNOSIS — Z7901 Long term (current) use of anticoagulants: Secondary | ICD-10-CM | POA: Diagnosis not present

## 2013-03-29 ENCOUNTER — Ambulatory Visit: Payer: Medicare Other | Admitting: Pulmonary Disease

## 2013-04-03 ENCOUNTER — Encounter: Payer: Self-pay | Admitting: Cardiology

## 2013-04-03 ENCOUNTER — Ambulatory Visit (INDEPENDENT_AMBULATORY_CARE_PROVIDER_SITE_OTHER): Payer: Medicare Other | Admitting: Cardiology

## 2013-04-03 VITALS — BP 162/47 | HR 68 | Ht 67.0 in | Wt 177.0 lb

## 2013-04-03 DIAGNOSIS — I10 Essential (primary) hypertension: Secondary | ICD-10-CM | POA: Diagnosis not present

## 2013-04-03 DIAGNOSIS — I359 Nonrheumatic aortic valve disorder, unspecified: Secondary | ICD-10-CM | POA: Diagnosis not present

## 2013-04-03 DIAGNOSIS — I2789 Other specified pulmonary heart diseases: Secondary | ICD-10-CM

## 2013-04-03 DIAGNOSIS — I5032 Chronic diastolic (congestive) heart failure: Secondary | ICD-10-CM

## 2013-04-03 DIAGNOSIS — I272 Pulmonary hypertension, unspecified: Secondary | ICD-10-CM

## 2013-04-03 DIAGNOSIS — I4891 Unspecified atrial fibrillation: Secondary | ICD-10-CM

## 2013-04-03 DIAGNOSIS — I480 Paroxysmal atrial fibrillation: Secondary | ICD-10-CM

## 2013-04-03 DIAGNOSIS — I350 Nonrheumatic aortic (valve) stenosis: Secondary | ICD-10-CM

## 2013-04-03 NOTE — Progress Notes (Signed)
[place], [place], [place]  [address] Phone: [phone] Fax:  [phone]  Date:  04/03/2013   ID:  Jackie Alvarez, DOB 1932-02-28, MRN [id number]  PCP:  Jonathon Bellows, MD  Cardiologist:  Fransico Him, MD     History of Present Illness:Jackie Alvarez is a 80 y.o. female with a history of chronic diastolic CHF, HTN, CKD stage III, remote atrial flutter and mild AS who was hospitalized in early November with acute on chronic renal failure complicated by acute diastolic CHF and atrial fibrillation. She was also treated for a UTI. She was discharged on 11/14 and then readmitted on 11/21 for worsening renal function and increased lethargy. She was bradycardic with HRs in the 40's in NSR. Her beta blocker and amio were stopped. Her diuretics were held and she was gently rehydrated. I saw her last in December and  she felt pretty good except for being weak.  She now presents back for followup.  She denies any chest pain or pressure, SOB, DOE, palpitations, dizziness or syncope.  She has trace chronic edema which is stable.     Wt Readings from Last 3 Encounters:  02/08/13 180 lb (81.647 kg)  01/30/13 179 lb 3.2 oz (81.285 kg)  01/20/13 174 lb 14.4 oz (79.334 kg)     Past Medical History  Diagnosis Date  . Diabetes mellitus   . Hypertension   . Hyperlipidemia   . Ulcer 2009    gatric with history of bleeding - resolved  . COPD (chronic obstructive pulmonary disease)   . Cancer 1994    Left Breast  . Carotid artery occlusion     mild bilateral  . Anxiety   . Central retinal vein occlusion, right eye   . Anemia   . Chronic kidney disease     CKD stage III  . Atrial flutter 2008    remote  . Thyroid disease     toxic multinodular goiter with history of thyrotoxicosis now s/p RAI ablative therapy - now hypothyroid  . Depression   . Peripheral neuropathy   . GERD (gastroesophageal reflux disease)   . Obesity   . Hiatal hernia   . Schatzki's ring   . Cholelithiases    . Chronic diastolic CHF (congestive heart failure)   . Aortic stenosis, mild     echo 12/2012  . Pulmonary HTN     mild PASP 32mmHg by echo 12/2012    Current Outpatient Prescriptions  Medication Sig Dispense Refill  . amLODipine (NORVASC) 10 MG tablet Take 10 mg by mouth daily.        . calcitRIOL (ROCALTROL) 0.25 MCG capsule       . calcium-vitamin D (OSCAL 500/200 D-3) 500-200 MG-UNIT per tablet Take 1 tablet by mouth 2 (two) times daily.        . Ferrous Sulfate (IRON) 325 (65 FE) MG TABS Take 325 mg by mouth 3 (three) times daily.       . fish oil-omega-3 fatty acids 1000 MG capsule Take 1 g by mouth 2 (two) times daily.        . furosemide (LASIX) 40 MG tablet       . glipiZIDE (GLUCOTROL XL) 2.5 MG 24 hr tablet       . hydrALAZINE (APRESOLINE) 50 MG tablet       . levothyroxine (SYNTHROID, LEVOTHROID) 50 MCG tablet Take 50 mcg by mouth daily before breakfast.      . mirtazapine (REMERON) 15  MG tablet Take 15 mg by mouth every morning.       Marland Kitchen omega-3 acid ethyl esters (LOVAZA) 1 G capsule       . omeprazole (PRILOSEC) 20 MG capsule Take 20 mg by mouth daily.        . pioglitazone (ACTOS) 30 MG tablet Take 30 mg by mouth daily.      . simvastatin (ZOCOR) 20 MG tablet Take 20 mg by mouth at bedtime.        . sitaGLIPtin (JANUVIA) 100 MG tablet Take 50 mg by mouth daily.        Marland Kitchen Umeclidinium-Vilanterol (ANORO ELLIPTA) 62.5-25 MCG/INH AEPB Inhale 25 mcg into the lungs daily as needed (shortness of breath).      . warfarin (COUMADIN) 2.5 MG tablet Take 1 tablet (2.5 mg total) by mouth as directed.       No current facility-administered medications for this visit.    Allergies:    Allergies  Allergen Reactions  . Metformin And Related     Renal insuff  . Omnipaque [Iohexol] Nausea And Vomiting    unknown    Social History:  The patient  reports that she quit smoking about 16 years ago. Her smoking use included Cigarettes. She has a 45 pack-year smoking history. She has  never used smokeless tobacco. She reports that she does not drink alcohol or use illicit drugs.   Family History:  The patient's family history includes Cancer in her father, mother, and sister; Cirrhosis in her father.   ROS:  Please see the history of present illness.      All other systems reviewed and negative.   PHYSICAL EXAM: VS:  There were no vitals taken for this visit. Well nourished, well developed, in no acute distress HEENT: normal Neck: no JVD Cardiac:  normal S1, S2; RRR; 1/6 SM at LLSB to apex Lungs:  clear to auscultation bilaterally, no wheezing, rhonchi or rales Abd: soft, nontender, no hepatomegaly Ext: trace edema Skin: warm and dry Neuro:  CNs 2-12 intact, no focal abnormalities noted  ASSESSMENT AND PLAN:  1. Chronic diastolic CHF - appears well compensated   - continue lasix 2. HTN - mildly elevated    - I have asked her to check her BP daily for a week and call with results    - continue amlodipine/Hydralazine 3. PAF - maintaining NSR   - continue Warfarin  4. Mild AS by recent echo 5. Mild pulmonary HTN by recent echo     Followup with me in 3 months  Signed, Fransico Him, MD 04/03/2013 4:09 PM

## 2013-04-03 NOTE — Patient Instructions (Signed)
Your physician recommends that you continue on your current medications as directed. Please refer to the Current Medication list given to you today.  Your physician has requested that you regularly monitor and record your blood pressure readings at home. Please use the same machine at the same time of day to check your readings and record them for a week. Please call and give [place] the results of the BP readings at the end of the week.  Your physician recommends that you schedule a follow-up appointment in: 3 Months with Dr [name].

## 2013-04-05 DIAGNOSIS — I10 Essential (primary) hypertension: Secondary | ICD-10-CM | POA: Diagnosis not present

## 2013-04-05 DIAGNOSIS — N183 Chronic kidney disease, stage 3 unspecified: Secondary | ICD-10-CM | POA: Diagnosis not present

## 2013-04-05 DIAGNOSIS — Z7901 Long term (current) use of anticoagulants: Secondary | ICD-10-CM | POA: Diagnosis not present

## 2013-04-05 DIAGNOSIS — E1149 Type 2 diabetes mellitus with other diabetic neurological complication: Secondary | ICD-10-CM | POA: Diagnosis not present

## 2013-04-05 DIAGNOSIS — E7800 Pure hypercholesterolemia, unspecified: Secondary | ICD-10-CM | POA: Diagnosis not present

## 2013-04-10 ENCOUNTER — Telehealth: Payer: Self-pay | Admitting: Cardiology

## 2013-04-10 ENCOUNTER — Encounter: Payer: Self-pay | Admitting: Family

## 2013-04-10 NOTE — Telephone Encounter (Signed)
Pt is aware.  

## 2013-04-10 NOTE — Telephone Encounter (Signed)
Follow Up  Pt is following up with BP results.   01/27  107/62  01/28 146/71  01/29 121/41  01/30 157/62  01/31 138/57  02/01 127/76

## 2013-04-10 NOTE — Telephone Encounter (Signed)
BP controlled for the most part - continue current meds

## 2013-04-10 NOTE — Telephone Encounter (Signed)
To Dr Turner to advise 

## 2013-04-11 ENCOUNTER — Ambulatory Visit (INDEPENDENT_AMBULATORY_CARE_PROVIDER_SITE_OTHER)
Admission: RE | Admit: 2013-04-11 | Discharge: 2013-04-11 | Disposition: A | Discharge status: Home / Self Care | Payer: Medicare Other | Source: Ambulatory Visit | Attending: Vascular Surgery | Admitting: Vascular Surgery

## 2013-04-11 ENCOUNTER — Encounter: Payer: Self-pay | Admitting: Family

## 2013-04-11 ENCOUNTER — Ambulatory Visit (INDEPENDENT_AMBULATORY_CARE_PROVIDER_SITE_OTHER): Payer: Medicare Other | Admitting: Family

## 2013-04-11 ENCOUNTER — Ambulatory Visit (HOSPITAL_COMMUNITY)
Admission: RE | Admit: 2013-04-11 | Discharge: 2013-04-11 | Disposition: A | Discharge status: Home / Self Care | Payer: Medicare Other | Source: Ambulatory Visit | Attending: Family | Admitting: Family

## 2013-04-11 VITALS — BP 157/64 | HR 54 | Resp 14 | Ht 67.0 in | Wt 182.0 lb

## 2013-04-11 DIAGNOSIS — I6529 Occlusion and stenosis of unspecified carotid artery: Secondary | ICD-10-CM | POA: Diagnosis not present

## 2013-04-11 DIAGNOSIS — Z48812 Encounter for surgical aftercare following surgery on the circulatory system: Secondary | ICD-10-CM | POA: Diagnosis not present

## 2013-04-11 DIAGNOSIS — Z09 Encounter for follow-up examination after completed treatment for conditions other than malignant neoplasm: Secondary | ICD-10-CM | POA: Insufficient documentation

## 2013-04-11 DIAGNOSIS — M79609 Pain in unspecified limb: Secondary | ICD-10-CM

## 2013-04-11 DIAGNOSIS — I739 Peripheral vascular disease, unspecified: Secondary | ICD-10-CM | POA: Diagnosis not present

## 2013-04-11 NOTE — Patient Instructions (Signed)
Stroke Prevention Some medical conditions and behaviors are associated with an increased chance of having a stroke. You may prevent a stroke by making healthy choices and managing medical conditions. HOW CAN I REDUCE MY RISK OF HAVING A STROKE?   Stay physically active. Get at least 30 minutes of activity on most or all days.  Do not smoke. It may also be helpful to avoid exposure to secondhand smoke.  Limit alcohol use. Moderate alcohol use is considered to be:  No more than 2 drinks per day for men.  No more than 1 drink per day for nonpregnant women.  Eat healthy foods. This involves  Eating 5 or more servings of fruits and vegetables a day.  Following a diet that addresses high blood pressure (hypertension), high cholesterol, diabetes, or obesity.  Manage your cholesterol levels.  A diet low in saturated fat, trans fat, and cholesterol and high in fiber may control cholesterol levels.  Take any prescribed medicines to control cholesterol as directed by your health care provider.  Manage your diabetes.  A controlled-carbohydrate, controlled-sugar diet is recommended to manage diabetes.  Take any prescribed medicines to control diabetes as directed by your health care provider.  Control your hypertension.  A low-salt (sodium), low-saturated fat, low-trans fat, and low-cholesterol diet is recommended to manage hypertension.  Take any prescribed medicines to control hypertension as directed by your health care provider.  Maintain a healthy weight.  A reduced-calorie, low-sodium, low-saturated fat, low-trans fat, low-cholesterol diet is recommended to manage weight.  Stop drug abuse.  Avoid taking birth control pills.  Talk to your health care provider about the risks of taking birth control pills if you are over 35 years old, smoke, get migraines, or have ever had a blood clot.  Get evaluated for sleep disorders (sleep apnea).  Talk to your health care provider about  getting a sleep evaluation if you snore a lot or have excessive sleepiness.  Take medicines as directed by your health care provider.  For some people, aspirin or blood thinners (anticoagulants) are helpful in reducing the risk of forming abnormal blood clots that can lead to stroke. If you have the irregular heart rhythm of atrial fibrillation, you should be on a blood thinner unless there is a good reason you cannot take them.  Understand all your medicine instructions.  Make sure that other other conditions (such as anemia or atherosclerosis) are addressed. SEEK IMMEDIATE MEDICAL CARE IF:   You have sudden weakness or numbness of the face, arm, or leg, especially on one side of the body.  Your face or eyelid droops to one side.  You have sudden confusion.  You have trouble speaking (aphasia) or understanding.  You have sudden trouble seeing in one or both eyes.  You have sudden trouble walking.  You have dizziness.  You have a loss of balance or coordination.  You have a sudden, severe headache with no known cause.  You have new chest pain or an irregular heartbeat. Any of these symptoms may represent a serious problem that is an emergency. Do not wait to see if the symptoms will go away. Get medical help at once. Call your local emergency services  (911 in [place] Do not drive yourself to the hospital. Document Released: 04/02/2004 Document Revised: 12/14/2012 Document Reviewed: 08/26/2012 [employer] Patient Information 2014 [employer]   Peripheral Vascular Disease Peripheral Vascular Disease (PVD), also called Peripheral Arterial Disease (PAD), is a circulation problem caused by cholesterol (atherosclerotic plaque) deposits in the   arteries. PVD commonly occurs in the lower extremities (legs) but it can occur in other areas of the body, such as your arms. The cholesterol buildup in the arteries reduces blood flow which can cause pain and other serious problems. The presence  of PVD can place a person at risk for Coronary Artery Disease (CAD).  CAUSES  Causes of PVD can be many. It is usually associated with more than one risk factor such as:   High Cholesterol.  Smoking.  Diabetes.  Lack of exercise or inactivity.  High blood pressure (hypertension).  Obesity.  Family history. SYMPTOMS   When the lower extremities are affected, patients with PVD may experience:  Leg pain with exertion or physical activity. This is called INTERMITTENT CLAUDICATION. This may present as cramping or numbness with physical activity. The location of the pain is associated with the level of blockage. For example, blockage at the abdominal level (distal abdominal aorta) may result in buttock or hip pain. Lower leg arterial blockage may result in calf pain.  As PVD becomes more severe, pain can develop with less physical activity.  In people with severe PVD, leg pain may occur at rest.  Other PVD signs and symptoms:  Leg numbness or weakness.  Coldness in the affected leg or foot, especially when compared to the other leg.  A change in leg color.  Patients with significant PVD are more prone to ulcers or sores on toes, feet or legs. These may take longer to heal or may reoccur. The ulcers or sores can become infected.  If signs and symptoms of PVD are ignored, gangrene may occur. This can result in the loss of toes or loss of an entire limb.  Not all leg pain is related to PVD. Other medical conditions can cause leg pain such as:  Blood clots (embolism) or Deep Vein Thrombosis.  Inflammation of the blood vessels (vasculitis).  Spinal stenosis. DIAGNOSIS  Diagnosis of PVD can involve several different types of tests. These can include:  Pulse Volume Recording Method (PVR). This test is simple, painless and does not involve the use of X-rays. PVR involves measuring and comparing the blood pressure in the arms and legs. An ABI (Ankle-Brachial Index) is calculated.  The normal ratio of blood pressures is 1. As this number becomes smaller, it indicates more severe disease.  < 0.95  indicates significant narrowing in one or more leg vessels.  <0.8 there will usually be pain in the foot, leg or buttock with exercise.  <0.4 will usually have pain in the legs at rest.  <0.25  usually indicates limb threatening PVD.  Doppler detection of pulses in the legs. This test is painless and checks to see if you have a pulses in your legs/feet.  A dye or contrast material (a substance that highlights the blood vessels so they show up on x-ray) may be given to help your caregiver better see the arteries for the following tests. The dye is eliminated from your body by the kidney's. Your caregiver may order blood work to check your kidney function and other laboratory values before the following tests are performed:  Magnetic Resonance Angiography (MRA). An MRA is a picture study of the blood vessels and arteries. The MRA machine uses a large magnet to produce images of the blood vessels.  Computed Tomography Angiography (CTA). A CTA is a specialized x-ray that looks at how the blood flows in your blood vessels. An IV may be inserted into your arm so contrast dye   can be injected.  Angiogram. Is a procedure that uses x-rays to look at your blood vessels. This procedure is minimally invasive, meaning a small incision (cut) is made in your groin. A small tube (catheter) is then inserted into the artery of your groin. The catheter is guided to the blood vessel or artery your caregiver wants to examine. Contrast dye is injected into the catheter. X-rays are then taken of the blood vessel or artery. After the images are obtained, the catheter is taken out. TREATMENT  Treatment of PVD involves many interventions which may include:  Lifestyle changes:  Quitting smoking.  Exercise.  Following a low fat, low cholesterol diet.  Control of diabetes.  Foot care is very  important to the PVD patient. Good foot care can help prevent infection.  Medication:  Cholesterol-lowering medicine.  Blood pressure medicine.  Anti-platelet drugs.  Certain medicines may reduce symptoms of Intermittent Claudication.  Interventional/Surgical options:  Angioplasty. An Angioplasty is a procedure that inflates a balloon in the blocked artery. This opens the blocked artery to improve blood flow.  Stent Implant. A wire mesh tube (stent) is placed in the artery. The stent expands and stays in place, allowing the artery to remain open.  Peripheral Bypass Surgery. This is a surgical procedure that reroutes the blood around a blocked artery to help improve blood flow. This type of procedure may be performed if Angioplasty or stent implants are not an option. SEEK IMMEDIATE MEDICAL CARE IF:   You develop pain or numbness in your arms or legs.  Your arm or leg turns cold, becomes blue in color.  You develop redness, warmth, swelling and pain in your arms or legs. MAKE SURE YOU:   Understand these instructions.  Will watch your condition.  Will get help right away if you are not doing well or get worse. Document Released: 04/02/2004 Document Revised: 05/18/2011 Document Reviewed: 02/28/2008 [employer] Patient Information 2014 [employer].  

## 2013-04-11 NOTE — Progress Notes (Signed)
Established Carotid Patient   History of Present Illness  Jackie Alvarez is a 80 y.o. female patient of Dr. Kellie Simmering followed for known carotid stenosis as well as a past vascular history of multiple surgeries including an aortobifem bypass graft in 1998 and 2001 a left profunda femoris to peroneal bypass graft using lesser saphenous vein with multiple revisions of the distal anastomosis. She returns today for follow up. Saw her cardiologist a couple of [place] ago, pt states she got a good report. Pt fell 2 years ago and fractured her right wrist, she has been afraid to walk much since then, has not fallen since.  Patient has Negative history of TIA or stroke symptom.  The patient denies amaurosis fugax or monocular blindness.  The patient  denies facial drooping.  Pt. denies hemiplegia.  The patient denies receptive or expressive aphasia.  Pt. denies extremity weakness. Pt denies steal symptoms in either arm.  Has claudication in both calves after walking about 100 feet, relieved with rest, denies nonhealing wounds.  Pt Diabetic: Yes, pt states in good control Pt smoker: former smoker, quit in 1998  Pt meds include: Statin : Yes ASA: No Other anticoagulants/antiplatelets: coumadin, possibly for atrial flutter, was started Oct., 2014, per pt., her heart rate was in the 30's, does not have a pacemaker.   Past Medical History  Diagnosis Date  . Diabetes mellitus   . Hypertension   . Hyperlipidemia   . Ulcer 2009    gatric with history of bleeding - resolved  . COPD (chronic obstructive pulmonary disease)   . Cancer 1994    Left Breast  . Carotid artery occlusion     mild bilateral  . Anxiety   . Central retinal vein occlusion, right eye   . Anemia   . Chronic kidney disease     CKD stage III  . Atrial flutter 2008    remote  . Thyroid disease     toxic multinodular goiter with history of thyrotoxicosis now s/p RAI ablative therapy - now hypothyroid  . Depression   .  Peripheral neuropathy   . GERD (gastroesophageal reflux disease)   . Obesity   . Hiatal hernia   . Schatzki's ring   . Cholelithiases   . Chronic diastolic CHF (congestive heart failure)   . Aortic stenosis, mild     echo 12/2012  . Pulmonary HTN     mild PASP 30mmHg by echo 12/2012    Social History History  Substance Use Topics  . Smoking status: Former Smoker -- 1.00 packs/day for 45 years    Types: Cigarettes    Quit date: 01/26/1997  . Smokeless tobacco: Never Used  . Alcohol Use: No    Family History Family History  Problem Relation Age of Onset  . Cancer Mother     stomach  . Cancer Father     liver  . Cirrhosis Father   . Cancer Sister   . Diabetes Sister     Surgical History Past Surgical History  Procedure Laterality Date  . Wrist surgery  02/2009  . Cholecystectomy      Gall Bladder  . Pr vein bypass graft,aorto-fem-pop    . Abdominal hysterectomy      with BSO  . Breast lumpectomy    . Abdominal aortagram  Jan. 18, 2013  . Hip surgery Left     Allergies  Allergen Reactions  . Metformin And Related     Renal insuff  . Omnipaque [Iohexol] Nausea And  Vomiting    unknown    Current Outpatient Prescriptions  Medication Sig Dispense Refill  . amLODipine (NORVASC) 10 MG tablet Take 10 mg by mouth daily.        . calcium-vitamin D (OSCAL 500/200 D-3) 500-200 MG-UNIT per tablet Take 1 tablet by mouth daily with breakfast.       . Ferrous Sulfate (IRON) 325 (65 FE) MG TABS Take 325 mg by mouth 2 (two) times daily.       . fish oil-omega-3 fatty acids 1000 MG capsule Take 1 g by mouth 2 (two) times daily.        . fluticasone (FLONASE) 50 MCG/ACT nasal spray Place 2 sprays into both nostrils daily.      . furosemide (LASIX) 40 MG tablet Take 40 mg by mouth 2 (two) times daily.       Marland Kitchen glipiZIDE (GLUCOTROL XL) 2.5 MG 24 hr tablet Take 2.5 mg by mouth daily with breakfast.       . hydrALAZINE (APRESOLINE) 50 MG tablet Take 50 mg by mouth 3 (three)  times daily.       Marland Kitchen levothyroxine (SYNTHROID, LEVOTHROID) 50 MCG tablet Take 50 mcg by mouth daily before breakfast.      . mirtazapine (REMERON) 15 MG tablet Take 15 mg by mouth every morning.       Marland Kitchen omeprazole (PRILOSEC) 20 MG capsule Take 20 mg by mouth daily.        . pioglitazone (ACTOS) 30 MG tablet Take 30 mg by mouth daily.      . simvastatin (ZOCOR) 20 MG tablet Take 20 mg by mouth at bedtime.        . sitaGLIPtin (JANUVIA) 100 MG tablet Take 100 mg by mouth daily.       Marland Kitchen warfarin (COUMADIN) 2.5 MG tablet Take 1 tablet (2.5 mg total) by mouth as directed.       No current facility-administered medications for this visit.    Review of Systems : See HPI for pertinent positives and negatives.  Physical Examination  Filed Vitals:   04/11/13 1049  BP: 157/64  Pulse: 54  Resp: 14   Filed Weights   04/11/13 1049  Weight: 182 lb (82.555 kg)   Body mass index is 28.5 kg/(m^2).  General: WDWN female in NAD GAIT: slow and deliberate Eyes: PERRLA Pulmonary:  CTAB, Negative  Rales, Negative rhonchi, & Negative wheezing.  Cardiac: regular Rhythm ,  Negative Murmurs.  VASCULAR EXAM Carotid Bruits Left Right   Negative Positive    Aorta is not palpable. Radial pulses are 1+ right, 2+ left palpable.                                                                                                                            LE Pulses LEFT RIGHT       FEMORAL   palpable   palpable        POPLITEAL  not palpable  not palpable       POSTERIOR TIBIAL  not palpable   not palpable        DORSALIS PEDIS      ANTERIOR TIBIAL not palpable  not palpable     Gastrointestinal: soft, nontender, BS WNL, no r/g,  negative masses.  Musculoskeletal: Negative muscle atrophy/wasting. M/S 4/5 in arms, 2/5 in legs, Extremities without ischemic changes. 1+ bilateral pretibial pitting edema.  Neurologic: A&O X 3; Appropriate Affect ; SENSATION ;normal;  Speech is normal CN 2-12 intact,  Pain and light touch intact in extremities, Motor exam as listed above.   Non-Invasive Vascular Imaging CAROTID DUPLEX 04/11/2013   CEREBROVASCULAR DUPLEX EVALUATION    INDICATION: Carotid disease    PREVIOUS INTERVENTION(S):     DUPLEX EXAM:     RIGHT  LEFT  Peak Systolic Velocities (cm/s) End Diastolic Velocities (cm/s) Plaque LOCATION Peak Systolic Velocities (cm/s) End Diastolic Velocities (cm/s) Plaque  136 13 HT CCA PROXIMAL 142 12 HT  86 12 HT CCA MID 123 13 HT  73 8 HT CCA DISTAL 98 11 HT  193 8 HT ECA 140 7 HT  59 10 HT ICA PROXIMAL 94 12 HT  87 14  ICA MID 83 13   74 11  ICA DISTAL 88 13     0.81 ICA / CCA Ratio (PSV) 0.96  Antegrade Vertebral Flow Antegrade  [id number] Brachial Systolic Pressure (mmHg) [id number]  Multiphasic (subclavian artery) Brachial Artery Waveforms Multiphasic (subclavian artery)    Plaque Morphology:  HM = Homogeneous, HT = Heterogeneous, CP = Calcific Plaque, SP = Smooth Plaque, IP = Irregular Plaque     ADDITIONAL FINDINGS: No significant stenosis of the left external or bilateral common carotid arteries. The right external carotid artery stenosis noted. Velocity of 217 cm/s noted in the left mid vertebral artery region.    IMPRESSION: Doppler velocities suggest less than 40% stenoses of the bilateral proximal internal carotid arteries.    Compared to the previous exam:  No significant change in the carotid arteries noted when compared to the previous exam on 04/12/12.    LOWER EXTREMITY ARTERIAL DUPLEX EVALUATION    INDICATION: Follow up bypass graft     PREVIOUS INTERVENTION(S): Aortobifemoral bypass graft in 1998; Left profunda femoral to mid peroneal artery bypass graft in 2001 with multiple revisions of the distal anastomosis    DUPLEX EXAM:     RIGHT  LEFT   Peak Systolic Velocity (cm/s) Ratio (if abnormal) Waveform  Peak Systolic Velocity (cm/s) Ratio (if abnormal) Waveform     Inflow Artery 142  B     Proximal Anastomosis 242  B      Proximal Graft 173  M     Mid Graft 112  B      Distal Graft 114  M     Distal Anastomosis 114  M     Outflow Artery 139  M  0.66 Today's ABI / TBI 0.96  0.63 Previous ABI / TBI (10/11/2012 ) 0.87    Waveform:    M - Monophasic       B - Biphasic       T - Triphasic  If Ankle Brachial Index (ABI) or Toe Brachial Index (TBI) performed, please see complete report     ADDITIONAL FINDINGS: No internal vessel narrowing noted within the bypass graft or anastomosis however an elevated velocity of the proximal anastomosis noted. This may be due to the angle of vessel takeoff and mild change in vessel  diameter.    IMPRESSION: Patent left leg bypass graft with an elevated velocity of the proximal anastomosis noted, as described above.    Compared to the previous exam:  No significant change when compared to the exam on 10/11/12.     Assessment: Jackie Alvarez is a 80 y.o. female patient of Dr. Kellie Simmering followed for known carotid stenosis as well as a past vascular history of multiple surgeries including an aortobifem bypass graft in 1998 and 2001 a left profunda femoris to peroneal bypass graft using lesser saphenous vein with multiple revisions of the distal anastomosis. She has a patent left leg bypass graft with an elevated velocity of the proximal anastomosis. She has moderate claudication in her calves with walking. ABI's indicate moderate arterial occlusive disease in the in the RLE and normal in the LLE. Lless than 40% stenoses of the bilateral proximal internal carotid arteries.  No significant change in the carotid arteries noted when compared to the previous exam on 04/12/12.    Plan: Follow-up in 6 months with ABI's, and bilateral LE arterial Duplex, and 1 year for carotid Duplex. Graduated walking program discussed.  I discussed in depth with the patient the nature of atherosclerosis, and emphasized the importance of maximal medical management including strict control of blood pressure,  blood glucose, and lipid levels, obtaining regular exercise, and cessation of smoking.  The patient is aware that without maximal medical management the underlying atherosclerotic disease process will progress, limiting the benefit of any interventions. The patient was given information about stroke prevention and what symptoms should prompt the patient to seek immediate medical care. Thank you for allowing [place] to participate in this patient's care.  Clemon Chambers, RN, MSN, FNP-C [facility] Office: [phone]  Clinic Physician: Early  04/11/2013 11:03 AM

## 2013-05-03 DIAGNOSIS — E039 Hypothyroidism, unspecified: Secondary | ICD-10-CM | POA: Diagnosis not present

## 2013-05-03 DIAGNOSIS — Z7901 Long term (current) use of anticoagulants: Secondary | ICD-10-CM | POA: Diagnosis not present

## 2013-05-03 DIAGNOSIS — H9319 Tinnitus, unspecified ear: Secondary | ICD-10-CM | POA: Diagnosis not present

## 2013-05-03 DIAGNOSIS — I4891 Unspecified atrial fibrillation: Secondary | ICD-10-CM | POA: Diagnosis not present

## 2013-05-09 DIAGNOSIS — Z7901 Long term (current) use of anticoagulants: Secondary | ICD-10-CM | POA: Diagnosis not present

## 2013-05-09 DIAGNOSIS — I4891 Unspecified atrial fibrillation: Secondary | ICD-10-CM | POA: Diagnosis not present

## 2013-05-16 DIAGNOSIS — Z7901 Long term (current) use of anticoagulants: Secondary | ICD-10-CM | POA: Diagnosis not present

## 2013-05-16 DIAGNOSIS — I4891 Unspecified atrial fibrillation: Secondary | ICD-10-CM | POA: Diagnosis not present

## 2013-05-23 DIAGNOSIS — N039 Chronic nephritic syndrome with unspecified morphologic changes: Secondary | ICD-10-CM | POA: Diagnosis not present

## 2013-05-23 DIAGNOSIS — N2581 Secondary hyperparathyroidism of renal origin: Secondary | ICD-10-CM | POA: Diagnosis not present

## 2013-05-23 DIAGNOSIS — N183 Chronic kidney disease, stage 3 unspecified: Secondary | ICD-10-CM | POA: Diagnosis not present

## 2013-05-23 DIAGNOSIS — R809 Proteinuria, unspecified: Secondary | ICD-10-CM | POA: Diagnosis not present

## 2013-05-23 DIAGNOSIS — I129 Hypertensive chronic kidney disease with stage 1 through stage 4 chronic kidney disease, or unspecified chronic kidney disease: Secondary | ICD-10-CM | POA: Diagnosis not present

## 2013-05-23 DIAGNOSIS — D631 Anemia in chronic kidney disease: Secondary | ICD-10-CM | POA: Diagnosis not present

## 2013-05-24 DIAGNOSIS — E1149 Type 2 diabetes mellitus with other diabetic neurological complication: Secondary | ICD-10-CM | POA: Diagnosis not present

## 2013-05-24 DIAGNOSIS — N183 Chronic kidney disease, stage 3 unspecified: Secondary | ICD-10-CM | POA: Diagnosis not present

## 2013-05-24 DIAGNOSIS — I10 Essential (primary) hypertension: Secondary | ICD-10-CM | POA: Diagnosis not present

## 2013-05-24 DIAGNOSIS — Z5181 Encounter for therapeutic drug level monitoring: Secondary | ICD-10-CM | POA: Diagnosis not present

## 2013-05-24 DIAGNOSIS — F3289 Other specified depressive episodes: Secondary | ICD-10-CM | POA: Diagnosis not present

## 2013-05-24 DIAGNOSIS — F329 Major depressive disorder, single episode, unspecified: Secondary | ICD-10-CM | POA: Diagnosis not present

## 2013-05-24 DIAGNOSIS — I4891 Unspecified atrial fibrillation: Secondary | ICD-10-CM | POA: Diagnosis not present

## 2013-05-26 ENCOUNTER — Ambulatory Visit: Payer: Medicare Other

## 2013-05-31 DIAGNOSIS — H903 Sensorineural hearing loss, bilateral: Secondary | ICD-10-CM | POA: Diagnosis not present

## 2013-05-31 DIAGNOSIS — H906 Mixed conductive and sensorineural hearing loss, bilateral: Secondary | ICD-10-CM | POA: Diagnosis not present

## 2013-06-07 DIAGNOSIS — H902 Conductive hearing loss, unspecified: Secondary | ICD-10-CM | POA: Diagnosis not present

## 2013-06-07 DIAGNOSIS — H6980 Other specified disorders of Eustachian tube, unspecified ear: Secondary | ICD-10-CM | POA: Diagnosis not present

## 2013-06-07 DIAGNOSIS — J342 Deviated nasal septum: Secondary | ICD-10-CM | POA: Diagnosis not present

## 2013-06-07 DIAGNOSIS — J343 Hypertrophy of nasal turbinates: Secondary | ICD-10-CM | POA: Diagnosis not present

## 2013-06-07 DIAGNOSIS — J310 Chronic rhinitis: Secondary | ICD-10-CM | POA: Diagnosis not present

## 2013-06-19 ENCOUNTER — Encounter: Payer: Self-pay | Admitting: Family Medicine

## 2013-06-20 ENCOUNTER — Ambulatory Visit: Payer: Medicare Other

## 2013-06-21 DIAGNOSIS — I4891 Unspecified atrial fibrillation: Secondary | ICD-10-CM | POA: Diagnosis not present

## 2013-06-21 DIAGNOSIS — Z7901 Long term (current) use of anticoagulants: Secondary | ICD-10-CM | POA: Diagnosis not present

## 2013-06-21 DIAGNOSIS — E039 Hypothyroidism, unspecified: Secondary | ICD-10-CM | POA: Diagnosis not present

## 2013-06-22 ENCOUNTER — Encounter: Payer: Self-pay | Admitting: Cardiology

## 2013-06-28 ENCOUNTER — Ambulatory Visit (INDEPENDENT_AMBULATORY_CARE_PROVIDER_SITE_OTHER): Payer: Medicare Other | Admitting: Cardiology

## 2013-06-28 ENCOUNTER — Encounter: Payer: Self-pay | Admitting: Cardiology

## 2013-06-28 VITALS — BP 161/41 | HR 69 | Ht 67.0 in | Wt 184.8 lb

## 2013-06-28 DIAGNOSIS — I4891 Unspecified atrial fibrillation: Secondary | ICD-10-CM | POA: Diagnosis not present

## 2013-06-28 DIAGNOSIS — R609 Edema, unspecified: Secondary | ICD-10-CM | POA: Diagnosis not present

## 2013-06-28 DIAGNOSIS — I480 Paroxysmal atrial fibrillation: Secondary | ICD-10-CM

## 2013-06-28 DIAGNOSIS — I2789 Other specified pulmonary heart diseases: Secondary | ICD-10-CM

## 2013-06-28 DIAGNOSIS — I359 Nonrheumatic aortic valve disorder, unspecified: Secondary | ICD-10-CM | POA: Diagnosis not present

## 2013-06-28 DIAGNOSIS — I350 Nonrheumatic aortic (valve) stenosis: Secondary | ICD-10-CM

## 2013-06-28 DIAGNOSIS — I10 Essential (primary) hypertension: Secondary | ICD-10-CM

## 2013-06-28 DIAGNOSIS — H6980 Other specified disorders of Eustachian tube, unspecified ear: Secondary | ICD-10-CM | POA: Diagnosis not present

## 2013-06-28 DIAGNOSIS — I272 Pulmonary hypertension, unspecified: Secondary | ICD-10-CM

## 2013-06-28 DIAGNOSIS — R600 Localized edema: Secondary | ICD-10-CM

## 2013-06-28 DIAGNOSIS — I6529 Occlusion and stenosis of unspecified carotid artery: Secondary | ICD-10-CM | POA: Diagnosis not present

## 2013-06-28 DIAGNOSIS — I5032 Chronic diastolic (congestive) heart failure: Secondary | ICD-10-CM | POA: Diagnosis not present

## 2013-06-28 DIAGNOSIS — Z7901 Long term (current) use of anticoagulants: Secondary | ICD-10-CM | POA: Diagnosis not present

## 2013-06-28 DIAGNOSIS — H902 Conductive hearing loss, unspecified: Secondary | ICD-10-CM | POA: Diagnosis not present

## 2013-06-28 NOTE — Progress Notes (Signed)
[address], [address], [place]  [address] Phone: [phone] Fax:  [phone]  Date:  06/28/2013   ID:  DENYSHA WESTLAND, DOB 1931/09/05, MRN [id number]  PCP:  Jonathon Bellows, MD  Cardiologist:  Fransico Him, MD     History of Present Illness:Jackie Alvarez is a 80 y.o. female with a history of chronic diastolic CHF, HTN, CKD stage III, remote atrial flutter and mild AS who was hospitalized in early November with acute on chronic renal failure complicated by acute diastolic CHF and atrial fibrillation. She was also treated for a UTI. She was discharged on 11/14 and then readmitted on 11/21 for worsening renal function and increased lethargy. She was bradycardic with HRs in the 40's in NSR. Her beta blocker and amio were stopped. Her diuretics were held and she was gently rehydrated. I saw her last in January and she felt pretty good. She now presents back for followup. She denies any chest pain or pressure, SOB, DOE, palpitations, dizziness or syncope. She has trace chronic edema which is stable.   Wt Readings from Last 3 Encounters:  04/11/13 182 lb (82.555 kg)  04/03/13 177 lb (80.287 kg)  02/08/13 180 lb (81.647 kg)     Past Medical History  Diagnosis Date  . Diabetes mellitus   . Hypertension   . Hyperlipidemia   . Ulcer 2009    gatric with history of bleeding - resolved  . COPD (chronic obstructive pulmonary disease)   . Cancer 1994    Left Breast  . Carotid artery occlusion     mild bilateral  . Anxiety   . Central retinal vein occlusion, right eye   . Anemia   . Chronic kidney disease     CKD stage III  . Atrial flutter 2008    remote  . Thyroid disease     toxic multinodular goiter with history of thyrotoxicosis now s/p RAI ablative therapy - now hypothyroid  . Depression   . Peripheral neuropathy   . GERD (gastroesophageal reflux disease)   . Obesity   . Hiatal hernia   . Schatzki's ring   . Cholelithiases   . Chronic diastolic CHF (congestive heart  failure)   . Aortic stenosis, mild     echo 12/2012  . Pulmonary HTN     mild PASP 67mmHg by echo 12/2012    Current Outpatient Prescriptions  Medication Sig Dispense Refill  . amLODipine (NORVASC) 10 MG tablet Take 10 mg by mouth daily.        . Ferrous Sulfate (IRON) 325 (65 FE) MG TABS Take 325 mg by mouth 2 (two) times daily.       . fluticasone (FLONASE) 50 MCG/ACT nasal spray Place 2 sprays into both nostrils daily.      . furosemide (LASIX) 40 MG tablet Take 40 mg by mouth 2 (two) times daily.       Marland Kitchen glipiZIDE (GLUCOTROL XL) 2.5 MG 24 hr tablet Take 2.5 mg by mouth daily with breakfast.       . hydrALAZINE (APRESOLINE) 50 MG tablet Take 75 mg by mouth 3 (three) times daily.       Marland Kitchen levothyroxine (SYNTHROID, LEVOTHROID) 50 MCG tablet Take 50 mcg by mouth daily before breakfast.      . mirtazapine (REMERON) 15 MG tablet Take 15 mg by mouth every morning.       . pioglitazone (ACTOS) 30 MG tablet Take 30 mg by mouth daily.      Marland Kitchen  simvastatin (ZOCOR) 20 MG tablet Take 20 mg by mouth at bedtime.        . sitaGLIPtin (JANUVIA) 100 MG tablet Take 50 mg by mouth daily.       Marland Kitchen warfarin (COUMADIN) 2.5 MG tablet Take 1 tablet (2.5 mg total) by mouth as directed.       No current facility-administered medications for this visit.    Allergies:    Allergies  Allergen Reactions  . Metformin And Related     Renal insuff  . Omnipaque [Iohexol] Nausea And Vomiting    unknown    Social History:  The patient  reports that she quit smoking about 16 years ago. Her smoking use included Cigarettes. She has a 45 pack-year smoking history. She has never used smokeless tobacco. She reports that she does not drink alcohol or use illicit drugs.   Family History:  The patient's family history includes Cancer in her father, mother, and sister; Cirrhosis in her father; Diabetes in her sister.   ROS:  Please see the history of present illness.      All other systems reviewed and negative.   PHYSICAL  EXAM: VS:  There were no vitals taken for this visit. Well nourished, well developed, in no acute distress HEENT: normal Neck: no JVD Cardiac:  normal S1, S2; RRR; 2/6 SM at RUSB to left carotid Lungs:  clear to auscultation bilaterally, no wheezing, rhonchi or rales Abd: soft, nontender, no hepatomegaly Ext: trace edema Skin: warm and dry Neuro:  CNs 2-12 intact, no focal abnormalities noted    ASSESSMENT AND PLAN:  1.  Chronic diastolic CHF - appears well compensated - continue lasix  2.  HTN - mildly elevated - at home it runs [id number] systolic - Dr. Mercy Moore is treating her HTN - continue amlodipine/Hydralazine  3.  PAF - maintaining NSR - continue Warfarin  4.  Mild AS by recent echo 5.  Mild pulmonary HTN by recent echo   Followup with me in 6 months   Signed, Fransico Him, MD 06/28/2013 10:47 AM

## 2013-06-28 NOTE — Patient Instructions (Signed)
Your physician recommends that you continue on your current medications as directed. Please refer to the Current Medication list given to you today.  Your physician has requested that you regularly monitor and record your blood pressure readings at home. Please use the same machine at the same time of day to check your readings and record them for one week and call in the results.  Your physician wants you to follow-up in: 6 months with Dr Mallie Snooks will receive a reminder letter in the mail two months in advance. If you don't receive a letter, please call our office to schedule the follow-up appointment.

## 2013-07-04 ENCOUNTER — Ambulatory Visit (INDEPENDENT_AMBULATORY_CARE_PROVIDER_SITE_OTHER): Payer: Medicare Other

## 2013-07-04 VITALS — BP 163/59 | HR 66 | Resp 17 | Ht 67.0 in | Wt 183.0 lb

## 2013-07-04 DIAGNOSIS — E1142 Type 2 diabetes mellitus with diabetic polyneuropathy: Secondary | ICD-10-CM | POA: Diagnosis not present

## 2013-07-04 DIAGNOSIS — I4891 Unspecified atrial fibrillation: Secondary | ICD-10-CM | POA: Diagnosis not present

## 2013-07-04 DIAGNOSIS — E1149 Type 2 diabetes mellitus with other diabetic neurological complication: Secondary | ICD-10-CM

## 2013-07-04 DIAGNOSIS — Z7901 Long term (current) use of anticoagulants: Secondary | ICD-10-CM | POA: Diagnosis not present

## 2013-07-04 DIAGNOSIS — L608 Other nail disorders: Secondary | ICD-10-CM | POA: Diagnosis not present

## 2013-07-04 DIAGNOSIS — Q828 Other specified congenital malformations of skin: Secondary | ICD-10-CM

## 2013-07-04 DIAGNOSIS — E1140 Type 2 diabetes mellitus with diabetic neuropathy, unspecified: Secondary | ICD-10-CM

## 2013-07-04 NOTE — Patient Instructions (Signed)
Diabetes and Foot Care Diabetes may cause you to have problems because of poor blood supply (circulation) to your feet and legs. This may cause the skin on your feet to become thinner, break easier, and heal more slowly. Your skin may become dry, and the skin may peel and crack. You may also have nerve damage in your legs and feet causing decreased feeling in them. You may not notice minor injuries to your feet that could lead to infections or more serious problems. Taking care of your feet is one of the most important things you can do for yourself.  HOME CARE INSTRUCTIONS  Wear shoes at all times, even in the house. Do not go barefoot. Bare feet are easily injured.  Check your feet daily for blisters, cuts, and redness. If you cannot see the bottom of your feet, use a mirror or ask someone for help.  Wash your feet with warm water (do not use hot water) and mild soap. Then pat your feet and the areas between your toes until they are completely dry. Do not soak your feet as this can dry your skin.  Apply a moisturizing lotion or petroleum jelly (that does not contain alcohol and is unscented) to the skin on your feet and to dry, brittle toenails. Do not apply lotion between your toes.  Trim your toenails straight across. Do not dig under them or around the cuticle. File the edges of your nails with an emery board or nail file.  Do not cut corns or calluses or try to remove them with medicine.  Wear clean socks or stockings every day. Make sure they are not too tight. Do not wear knee-high stockings since they may decrease blood flow to your legs.  Wear shoes that fit properly and have enough cushioning. To break in new shoes, wear them for just a few hours a day. This prevents you from injuring your feet. Always look in your shoes before you put them on to be sure there are no objects inside.  Do not cross your legs. This may decrease the blood flow to your feet.  If you find a minor scrape,  cut, or break in the skin on your feet, keep it and the skin around it clean and dry. These areas may be cleansed with mild soap and water. Do not cleanse the area with peroxide, alcohol, or iodine.  When you remove an adhesive bandage, be sure not to damage the skin around it.  If you have a wound, look at it several times a day to make sure it is healing.  Do not use heating pads or hot water bottles. They may burn your skin. If you have lost feeling in your feet or legs, you may not know it is happening until it is too late.  Make sure your health care provider performs a complete foot exam at least annually or more often if you have foot problems. Report any cuts, sores, or bruises to your health care provider immediately. SEEK MEDICAL CARE IF:   You have an injury that is not healing.  You have cuts or breaks in the skin.  You have an ingrown nail.  You notice redness on your legs or feet.  You feel burning or tingling in your legs or feet.  You have pain or cramps in your legs and feet.  Your legs or feet are numb.  Your feet always feel cold. SEEK IMMEDIATE MEDICAL CARE IF:   There is increasing redness,   swelling, or pain in or around a wound.  There is a red line that goes up your leg.  Pus is coming from a wound.  You develop a fever or as directed by your health care provider.  You notice a bad smell coming from an ulcer or wound. Document Released: 02/21/2000 Document Revised: 10/26/2012 Document Reviewed: 08/02/2012 [employer] Patient Information 2014 [employer].  

## 2013-07-04 NOTE — Progress Notes (Signed)
   Subjective:    Patient ID: Jackie Alvarez, female    DOB: [birth year], 80 y.o.   MRN: [id number]  HPI Comments: Pt presents for debridement of thick, crumbly toenails 1 - 9.     Review of Systems no systemic changes or findings are noted     Objective:   Physical Exam [place] objective findings as follows pedal pulses are elbow thready DP plus one over 4 PT nonpalpable bilateral is a history of peripheral vascular disease and partial indications second toe left and distal left hallux or there is some varicose discoloration and dystrophy remaining nails nails thick brittle, and friable discolored and brittle one through 5 right 134 and 5 left there is keratoses sub-first left and distal clavus or distal keratoses hallux left as well as pinch callus of the right hallux. Nails thick brittle crumbly friable discolored 1 through 5x91 through 5 right 1345 left on debridement of the hallux Neosporin and Band-Aid dressing are applied patient instructed to monitor the area is recently been started back on Coumadin likely for atrial fib and cardiac complications.       Assessment & Plan:  Assessment this time his diabetes with complications peripheral neuropathy and angiopathy. Mycotic dystrophic brittle nails are debrided x9 also multiple keratoses or debridement this time return in 3 months for continued palliative care in the future suggest 3 month followup  Harriet Masson DPM

## 2013-07-05 DIAGNOSIS — H6520 Chronic serous otitis media, unspecified ear: Secondary | ICD-10-CM | POA: Diagnosis not present

## 2013-07-05 DIAGNOSIS — H902 Conductive hearing loss, unspecified: Secondary | ICD-10-CM | POA: Diagnosis not present

## 2013-07-20 DIAGNOSIS — Z7901 Long term (current) use of anticoagulants: Secondary | ICD-10-CM | POA: Diagnosis not present

## 2013-07-20 DIAGNOSIS — I10 Essential (primary) hypertension: Secondary | ICD-10-CM | POA: Diagnosis not present

## 2013-07-20 DIAGNOSIS — N183 Chronic kidney disease, stage 3 unspecified: Secondary | ICD-10-CM | POA: Diagnosis not present

## 2013-07-20 DIAGNOSIS — E119 Type 2 diabetes mellitus without complications: Secondary | ICD-10-CM | POA: Diagnosis not present

## 2013-07-20 DIAGNOSIS — I4891 Unspecified atrial fibrillation: Secondary | ICD-10-CM | POA: Diagnosis not present

## 2013-07-20 DIAGNOSIS — I6529 Occlusion and stenosis of unspecified carotid artery: Secondary | ICD-10-CM | POA: Diagnosis not present

## 2013-08-02 DIAGNOSIS — H9319 Tinnitus, unspecified ear: Secondary | ICD-10-CM | POA: Diagnosis not present

## 2013-08-02 DIAGNOSIS — H6980 Other specified disorders of Eustachian tube, unspecified ear: Secondary | ICD-10-CM | POA: Diagnosis not present

## 2013-08-02 DIAGNOSIS — H903 Sensorineural hearing loss, bilateral: Secondary | ICD-10-CM | POA: Diagnosis not present

## 2013-08-02 DIAGNOSIS — R439 Unspecified disturbances of smell and taste: Secondary | ICD-10-CM | POA: Diagnosis not present

## 2013-08-07 DIAGNOSIS — Z1231 Encounter for screening mammogram for malignant neoplasm of breast: Secondary | ICD-10-CM | POA: Diagnosis not present

## 2013-08-07 DIAGNOSIS — Z853 Personal history of malignant neoplasm of breast: Secondary | ICD-10-CM | POA: Diagnosis not present

## 2013-08-17 DIAGNOSIS — Z7901 Long term (current) use of anticoagulants: Secondary | ICD-10-CM | POA: Diagnosis not present

## 2013-08-17 DIAGNOSIS — N183 Chronic kidney disease, stage 3 unspecified: Secondary | ICD-10-CM | POA: Diagnosis not present

## 2013-08-17 DIAGNOSIS — I4892 Unspecified atrial flutter: Secondary | ICD-10-CM | POA: Diagnosis not present

## 2013-08-17 DIAGNOSIS — I10 Essential (primary) hypertension: Secondary | ICD-10-CM | POA: Diagnosis not present

## 2013-08-21 DIAGNOSIS — I129 Hypertensive chronic kidney disease with stage 1 through stage 4 chronic kidney disease, or unspecified chronic kidney disease: Secondary | ICD-10-CM | POA: Diagnosis not present

## 2013-08-21 DIAGNOSIS — R809 Proteinuria, unspecified: Secondary | ICD-10-CM | POA: Diagnosis not present

## 2013-08-21 DIAGNOSIS — N183 Chronic kidney disease, stage 3 unspecified: Secondary | ICD-10-CM | POA: Diagnosis not present

## 2013-08-21 DIAGNOSIS — D631 Anemia in chronic kidney disease: Secondary | ICD-10-CM | POA: Diagnosis not present

## 2013-09-14 DIAGNOSIS — Z7901 Long term (current) use of anticoagulants: Secondary | ICD-10-CM | POA: Diagnosis not present

## 2013-09-14 DIAGNOSIS — I4892 Unspecified atrial flutter: Secondary | ICD-10-CM | POA: Diagnosis not present

## 2013-10-03 ENCOUNTER — Ambulatory Visit (INDEPENDENT_AMBULATORY_CARE_PROVIDER_SITE_OTHER): Payer: Medicare Other

## 2013-10-03 ENCOUNTER — Ambulatory Visit: Payer: Medicare Other

## 2013-10-03 DIAGNOSIS — L608 Other nail disorders: Secondary | ICD-10-CM

## 2013-10-03 DIAGNOSIS — E1142 Type 2 diabetes mellitus with diabetic polyneuropathy: Secondary | ICD-10-CM

## 2013-10-03 DIAGNOSIS — Q828 Other specified congenital malformations of skin: Secondary | ICD-10-CM | POA: Diagnosis not present

## 2013-10-03 DIAGNOSIS — E1149 Type 2 diabetes mellitus with other diabetic neurological complication: Secondary | ICD-10-CM | POA: Diagnosis not present

## 2013-10-03 DIAGNOSIS — E1140 Type 2 diabetes mellitus with diabetic neuropathy, unspecified: Secondary | ICD-10-CM

## 2013-10-03 DIAGNOSIS — I739 Peripheral vascular disease, unspecified: Secondary | ICD-10-CM

## 2013-10-03 NOTE — Patient Instructions (Signed)
Diabetes and Foot Care Diabetes may cause you to have problems because of poor blood supply (circulation) to your feet and legs. This may cause the skin on your feet to become thinner, break easier, and heal more slowly. Your skin may become dry, and the skin may peel and crack. You may also have nerve damage in your legs and feet causing decreased feeling in them. You may not notice minor injuries to your feet that could lead to infections or more serious problems. Taking care of your feet is one of the most important things you can do for yourself.  HOME CARE INSTRUCTIONS  Wear shoes at all times, even in the house. Do not go barefoot. Bare feet are easily injured.  Check your feet daily for blisters, cuts, and redness. If you cannot see the bottom of your feet, use a mirror or ask someone for help.  Wash your feet with warm water (do not use hot water) and mild soap. Then pat your feet and the areas between your toes until they are completely dry. Do not soak your feet as this can dry your skin.  Apply a moisturizing lotion or petroleum jelly (that does not contain alcohol and is unscented) to the skin on your feet and to dry, brittle toenails. Do not apply lotion between your toes.  Trim your toenails straight across. Do not dig under them or around the cuticle. File the edges of your nails with an emery board or nail file.  Do not cut corns or calluses or try to remove them with medicine.  Wear clean socks or stockings every day. Make sure they are not too tight. Do not wear knee-high stockings since they may decrease blood flow to your legs.  Wear shoes that fit properly and have enough cushioning. To break in new shoes, wear them for just a few hours a day. This prevents you from injuring your feet. Always look in your shoes before you put them on to be sure there are no objects inside.  Do not cross your legs. This may decrease the blood flow to your feet.  If you find a minor scrape,  cut, or break in the skin on your feet, keep it and the skin around it clean and dry. These areas may be cleansed with mild soap and water. Do not cleanse the area with peroxide, alcohol, or iodine.  When you remove an adhesive bandage, be sure not to damage the skin around it.  If you have a wound, look at it several times a day to make sure it is healing.  Do not use heating pads or hot water bottles. They may burn your skin. If you have lost feeling in your feet or legs, you may not know it is happening until it is too late.  Make sure your health care provider performs a complete foot exam at least annually or more often if you have foot problems. Report any cuts, sores, or bruises to your health care provider immediately. SEEK MEDICAL CARE IF:   You have an injury that is not healing.  You have cuts or breaks in the skin.  You have an ingrown nail.  You notice redness on your legs or feet.  You feel burning or tingling in your legs or feet.  You have pain or cramps in your legs and feet.  Your legs or feet are numb.  Your feet always feel cold. SEEK IMMEDIATE MEDICAL CARE IF:   There is increasing redness,   swelling, or pain in or around a wound.  There is a red line that goes up your leg.  Pus is coming from a wound.  You develop a fever or as directed by your health care provider.  You notice a bad smell coming from an ulcer or wound. Document Released: 02/21/2000 Document Revised: 10/26/2012 Document Reviewed: 08/02/2012 Exit[employer] Patient Information 2015 [employer]. This information is not intended to replace advice given to you by your health care provider. Make sure you discuss any questions you have with your health care provider.  

## 2013-10-03 NOTE — Progress Notes (Signed)
   Subjective:    Patient ID: Jackie Alvarez, female    DOB: [birth year], 80 y.o.   MRN: [id number]  HPI  Pt presents for debridement  Review of Systems no new findings or systemic changes noted     Objective:   Physical Exam  Lower extremity neurovascular status is intact although diminished DP plus one over 4 PT nonpalpable bilateral history of previous vascular procedures done history of vascular disease. Partial amputation second toe left and distal left hallux due to ischemic changes to start to there's varicose discoloration dystrophy of the remaining nails with discoloration and brittleness and friability 134 and 5 left 1 through 5 right. Following debridement the left hallux is treated with lumicain and Neosporin and Band-Aid dressing      Assessment & Plan:  Assessment history of diabetes and complications history of vascular compromise. History peripheral neuropathy dystrophic probably mycotic nails debridement x9 return for future palliative care is needed single keratotic lesion pinch callus of the left hallux is debrided at this time as well  Harriet Masson DPM

## 2013-10-06 ENCOUNTER — Encounter: Payer: Self-pay | Admitting: Family

## 2013-10-09 ENCOUNTER — Ambulatory Visit (INDEPENDENT_AMBULATORY_CARE_PROVIDER_SITE_OTHER)
Admission: RE | Admit: 2013-10-09 | Discharge: 2013-10-09 | Disposition: A | Discharge status: Home / Self Care | Payer: Medicare Other | Source: Ambulatory Visit | Attending: Family | Admitting: Family

## 2013-10-09 ENCOUNTER — Ambulatory Visit (INDEPENDENT_AMBULATORY_CARE_PROVIDER_SITE_OTHER): Payer: Medicare Other | Admitting: Family

## 2013-10-09 ENCOUNTER — Ambulatory Visit (HOSPITAL_COMMUNITY)
Admission: RE | Admit: 2013-10-09 | Discharge: 2013-10-09 | Disposition: A | Discharge status: Home / Self Care | Payer: Medicare Other | Source: Ambulatory Visit | Attending: Family | Admitting: Family

## 2013-10-09 ENCOUNTER — Other Ambulatory Visit: Payer: Self-pay | Admitting: Family

## 2013-10-09 ENCOUNTER — Encounter: Payer: Self-pay | Admitting: Family

## 2013-10-09 VITALS — BP 146/61 | HR 72 | Resp 16 | Ht 67.0 in | Wt 183.0 lb

## 2013-10-09 DIAGNOSIS — I739 Peripheral vascular disease, unspecified: Secondary | ICD-10-CM

## 2013-10-09 DIAGNOSIS — Z48812 Encounter for surgical aftercare following surgery on the circulatory system: Secondary | ICD-10-CM

## 2013-10-09 DIAGNOSIS — M79609 Pain in unspecified limb: Secondary | ICD-10-CM

## 2013-10-09 DIAGNOSIS — I6529 Occlusion and stenosis of unspecified carotid artery: Secondary | ICD-10-CM

## 2013-10-09 NOTE — Progress Notes (Signed)
[facility] HISTORY AND PHYSICAL -PAD  History of Present Illness Jackie Alvarez is a 80 y.o. female female patient of Dr. Kellie Simmering followed for known carotid stenosis as well as a past vascular history of multiple surgeries including an aortobifem bypass graft in 1998 and 2001 a left profunda femoris to peroneal bypass graft using lesser saphenous vein with multiple revisions of the distal anastomosis.  She returns today for follow up of PAD.  Pt fell on a wet floor in 2013 and fractured her right wrist, she has been afraid to walk much since then, has not fallen since.  Patient has Negative history of TIA or stroke symptom. The patient denies amaurosis fugax or monocular blindness. The patient denies facial drooping.  Pt. denies hemiplegia. The patient denies receptive or expressive aphasia. Pt. denies extremity weakness.  Pt denies steal symptoms in either arm.  She has claudication in both calves after walking about 200 feet, relieved with rest, denies nonhealing wounds.   Pt Diabetic: Yes, pt states in good control  Pt smoker: former smoker, quit in 1998   Pt meds include:  Statin : Yes  ASA: No  Other anticoagulants/antiplatelets: coumadin, possibly for atrial flutter, was started Oct., 2014, per pt., her heart rate was in the 30's, does not have a pacemaker.   Past Medical History  Diagnosis Date  . Diabetes mellitus   . Hypertension   . Hyperlipidemia   . Ulcer 2009    gatric with history of bleeding - resolved  . COPD (chronic obstructive pulmonary disease)   . Cancer 1994    Left Breast  . Carotid artery occlusion     mild bilateral  . Anxiety   . Central retinal vein occlusion, right eye   . Anemia   . Chronic kidney disease     CKD stage III  . Atrial flutter 2008    remote  . Thyroid disease     toxic multinodular goiter with history of thyrotoxicosis now s/p RAI ablative therapy - now hypothyroid  . Depression   . Peripheral  neuropathy   . GERD (gastroesophageal reflux disease)   . Obesity   . Hiatal hernia   . Schatzki's ring   . Cholelithiases   . Chronic diastolic CHF (congestive heart failure)   . Aortic stenosis, mild     echo 12/2012  . Pulmonary HTN     mild PASP 27mmHg by echo 12/2012    Social History History  Substance Use Topics  . Smoking status: Former Smoker -- 1.00 packs/day for 45 years    Types: Cigarettes    Quit date: 01/26/1997  . Smokeless tobacco: Never Used  . Alcohol Use: No    Family History Family History  Problem Relation Age of Onset  . Cancer Mother     stomach  . Cancer Father     liver  . Cirrhosis Father   . Cancer Sister   . Diabetes Sister     Past Surgical History  Procedure Laterality Date  . Wrist surgery  02/2009  . Cholecystectomy      Gall Bladder  . Pr vein bypass graft,aorto-fem-pop    . Abdominal hysterectomy      with BSO  . Breast lumpectomy    . Abdominal aortagram  Jan. 18, 2013  . Hip surgery Left     Allergies  Allergen Reactions  . Metformin And Related Other (See Comments)    Renal insuff  . Omnipaque [Iohexol] Nausea  And Vomiting    unknown    Current Outpatient Prescriptions  Medication Sig Dispense Refill  . amLODipine (NORVASC) 10 MG tablet Take 10 mg by mouth daily.        . Ferrous Sulfate (IRON) 325 (65 FE) MG TABS Take 325 mg by mouth 2 (two) times daily.       . fluticasone (FLONASE) 50 MCG/ACT nasal spray Place 2 sprays into both nostrils daily.      . furosemide (LASIX) 40 MG tablet Take 40 mg by mouth 2 (two) times daily.       Marland Kitchen glipiZIDE (GLUCOTROL XL) 2.5 MG 24 hr tablet Take 2.5 mg by mouth daily with breakfast.       . hydrALAZINE (APRESOLINE) 50 MG tablet Take 75 mg by mouth 3 (three) times daily.       Marland Kitchen levothyroxine (SYNTHROID, LEVOTHROID) 50 MCG tablet Take 50 mcg by mouth daily before breakfast.      . mirtazapine (REMERON) 15 MG tablet Take 15 mg by mouth every morning.       . pioglitazone (ACTOS)  30 MG tablet Take 30 mg by mouth daily.      . simvastatin (ZOCOR) 20 MG tablet Take 20 mg by mouth at bedtime.        . sitaGLIPtin (JANUVIA) 100 MG tablet Take 50 mg by mouth daily.       Marland Kitchen warfarin (COUMADIN) 2.5 MG tablet Take 1 tablet (2.5 mg total) by mouth as directed.       No current facility-administered medications for this visit.    ROS: See HPI for pertinent positives and negatives.   Physical Examination  Filed Vitals:   10/09/13 1527  BP: 146/61  Pulse: 72  Resp: 16  Height: 5\' 7"  (1.702 m)  Weight: 183 lb (83.008 kg)  SpO2: 98%   Body mass index is 28.66 kg/(m^2).  General: WDWN female in NAD  GAIT: slow and deliberate  Eyes: PERRLA  Pulmonary: CTAB, Negative Rales, Negative rhonchi, & Negative wheezing.  Cardiac: regular Rhythm , No detected Murmur.   VASCULAR EXAM  Carotid Bruits  Left  Right    Negative  Positive   Aorta is not palpable.  Radial pulses are 1+ right, 2+ left palpable.   LE Pulses  LEFT  RIGHT   FEMORAL  palpable  palpable   POPLITEAL  not palpable  not palpable   POSTERIOR TIBIAL  not palpable  not palpable   DORSALIS PEDIS  ANTERIOR TIBIAL  1+ palpable  1+ palpable    Gastrointestinal: soft, nontender, BS WNL, no r/g, negative masses.  Musculoskeletal: Negative muscle atrophy/wasting. M/S 4/5 in arms, 2/5 in legs, Extremities without ischemic changes. 1+ bilateral pretibial pitting edema.  Neurologic: A&O X 3; Appropriate Affect ; SENSATION ;normal;  Speech is normal  CN 2-12 intact, Pain and light touch intact in extremities, Motor exam as listed above.    Non-Invasive Vascular Imaging: DATE: 10/09/2013 LOWER EXTREMITY ARTERIAL EVALUATION    INDICATION: Peripheral Vascular Disease     PREVIOUS INTERVENTION(S): Aorto-bi-femoral BPG 1998; Left profunda-peroneal BPG 2001 with multiple distal anastomotic revisions.    DUPLEX EXAM: Duplex evaluation of the lower extremity arterial system to include the common femoral, superficial  femoral, popliteal, and tibial arteries and bypass graft(s) and/or stent(s) if present.      FINDINGS:  RIGHT LOWER EXTREMITY:  Heterogeneous plaque present throughout the right lower extremity.  LEFT LOWER EXTREMITY:  See drawing.     See the attached diagram for velocities.  IMPRESSION:  Elevated velocities present involving the right superficial femoral artery throughout suggestive of greater than 50% stenosis. Dampened Turbulent waveform present involving the left common femoral artery which may be suggestive of a more proximal disease process than can be visualized. Patent left profunda femoral to peroneal artery bypass graft, no hemodynamically significant plaque or thrombus is visualized.  ANKLE/BRACHIAL INDEX - Right = 0.56               Previous (04/11/13) Right= 0.66  Left =   0.93   Left=   0.96      (Please see complete report)                   ASSESSMENT: Jackie Alvarez is a 80 y.o. female patient of Dr. Kellie Simmering followed for known carotid stenosis as well as a past vascular history of multiple surgeries including an aortobifem bypass graft in 1998 and 2001 a left profunda femoris to peroneal bypass graft using lesser saphenous vein with multiple revisions of the distal anastomosis.  She has claudication in both calves after walking about 200 feet, relieved with rest, denies nonhealing wounds.   In February, 2015 by carotid Duplex she had less than 40% stenoses of the bilateral proximal internal carotid arteries.   Today by bilateral LE arterial Duplex demonstrates elevated velocities involving the right superficial femoral artery throughout suggestive of greater than 50% stenosis. Noted 519 cm/sec velocity at right proximal SFA. Dampened Turbulent waveform present involving the left common femoral artery which may be suggestive of a more proximal disease process than can be visualized. Patent left profunda femoral to peroneal  artery bypass graft, no hemodynamically significant plaque or thrombus is visualized. Left LE ABI remains stable, right LE has worsened from moderate to severe arterial occlusive disease.   PLAN:  I discussed in depth with the patient the nature of atherosclerosis, and emphasized the importance of maximal medical management including strict control of blood pressure, blood glucose, and lipid levels, obtaining regular exercise, and continued cessation of smoking.  The patient is aware that without maximal medical management the underlying atherosclerotic disease process will progress, limiting the benefit of any interventions.  Based on the patient's vascular studies and examination, pt will return to clinic in 6 months with ABI's and bilateral LE arterial Duplex. Leg and arm exercises while seated as demonstrated and discussed with pt since she is afraid of falling.   The patient was given information about PAD including signs, symptoms, treatment, what symptoms should prompt the patient to seek immediate medical care, and risk reduction measures to take.  Clemon Chambers, RN, MSN, FNP-C [facility] Phone: [phone]  Clinic MD: Trula Slade  10/09/2013 3:37 PM

## 2013-10-09 NOTE — Patient Instructions (Signed)

## 2013-10-19 DIAGNOSIS — N183 Chronic kidney disease, stage 3 unspecified: Secondary | ICD-10-CM | POA: Diagnosis not present

## 2013-10-19 DIAGNOSIS — R809 Proteinuria, unspecified: Secondary | ICD-10-CM | POA: Diagnosis not present

## 2013-10-19 DIAGNOSIS — I10 Essential (primary) hypertension: Secondary | ICD-10-CM | POA: Diagnosis not present

## 2013-10-19 DIAGNOSIS — E119 Type 2 diabetes mellitus without complications: Secondary | ICD-10-CM | POA: Diagnosis not present

## 2013-10-19 DIAGNOSIS — E669 Obesity, unspecified: Secondary | ICD-10-CM | POA: Diagnosis not present

## 2013-10-19 DIAGNOSIS — E039 Hypothyroidism, unspecified: Secondary | ICD-10-CM | POA: Diagnosis not present

## 2013-10-19 DIAGNOSIS — Z7901 Long term (current) use of anticoagulants: Secondary | ICD-10-CM | POA: Diagnosis not present

## 2013-10-19 DIAGNOSIS — D500 Iron deficiency anemia secondary to blood loss (chronic): Secondary | ICD-10-CM | POA: Diagnosis not present

## 2013-11-16 DIAGNOSIS — I4892 Unspecified atrial flutter: Secondary | ICD-10-CM | POA: Diagnosis not present

## 2013-11-16 DIAGNOSIS — Z7901 Long term (current) use of anticoagulants: Secondary | ICD-10-CM | POA: Diagnosis not present

## 2013-11-16 DIAGNOSIS — E119 Type 2 diabetes mellitus without complications: Secondary | ICD-10-CM | POA: Diagnosis not present

## 2013-11-23 DIAGNOSIS — D631 Anemia in chronic kidney disease: Secondary | ICD-10-CM | POA: Diagnosis not present

## 2013-11-23 DIAGNOSIS — N183 Chronic kidney disease, stage 3 unspecified: Secondary | ICD-10-CM | POA: Diagnosis not present

## 2013-11-23 DIAGNOSIS — R809 Proteinuria, unspecified: Secondary | ICD-10-CM | POA: Diagnosis not present

## 2013-11-23 DIAGNOSIS — I129 Hypertensive chronic kidney disease with stage 1 through stage 4 chronic kidney disease, or unspecified chronic kidney disease: Secondary | ICD-10-CM | POA: Diagnosis not present

## 2013-11-23 DIAGNOSIS — N039 Chronic nephritic syndrome with unspecified morphologic changes: Secondary | ICD-10-CM | POA: Diagnosis not present

## 2013-12-04 ENCOUNTER — Other Ambulatory Visit: Payer: Self-pay | Admitting: Family Medicine

## 2013-12-04 ENCOUNTER — Ambulatory Visit
Admission: RE | Admit: 2013-12-04 | Discharge: 2013-12-04 | Disposition: A | Discharge status: Home / Self Care | Payer: Medicare Other | Source: Ambulatory Visit | Attending: Family Medicine | Admitting: Family Medicine

## 2013-12-04 DIAGNOSIS — R0989 Other specified symptoms and signs involving the circulatory and respiratory systems: Secondary | ICD-10-CM | POA: Diagnosis not present

## 2013-12-04 DIAGNOSIS — R059 Cough, unspecified: Secondary | ICD-10-CM

## 2013-12-04 DIAGNOSIS — R918 Other nonspecific abnormal finding of lung field: Secondary | ICD-10-CM | POA: Diagnosis not present

## 2013-12-04 DIAGNOSIS — J111 Influenza due to unidentified influenza virus with other respiratory manifestations: Secondary | ICD-10-CM | POA: Diagnosis not present

## 2013-12-04 DIAGNOSIS — R05 Cough: Secondary | ICD-10-CM

## 2013-12-04 DIAGNOSIS — R062 Wheezing: Secondary | ICD-10-CM | POA: Diagnosis not present

## 2013-12-04 DIAGNOSIS — J189 Pneumonia, unspecified organism: Secondary | ICD-10-CM | POA: Diagnosis not present

## 2013-12-14 DIAGNOSIS — I4892 Unspecified atrial flutter: Secondary | ICD-10-CM | POA: Diagnosis not present

## 2013-12-14 DIAGNOSIS — Z7901 Long term (current) use of anticoagulants: Secondary | ICD-10-CM | POA: Diagnosis not present

## 2013-12-14 DIAGNOSIS — E876 Hypokalemia: Secondary | ICD-10-CM | POA: Diagnosis not present

## 2013-12-21 DIAGNOSIS — Z7901 Long term (current) use of anticoagulants: Secondary | ICD-10-CM | POA: Diagnosis not present

## 2013-12-21 DIAGNOSIS — E876 Hypokalemia: Secondary | ICD-10-CM | POA: Diagnosis not present

## 2013-12-26 ENCOUNTER — Ambulatory Visit (INDEPENDENT_AMBULATORY_CARE_PROVIDER_SITE_OTHER): Payer: Medicare Other | Admitting: Cardiology

## 2013-12-26 ENCOUNTER — Encounter: Payer: Self-pay | Admitting: Cardiology

## 2013-12-26 VITALS — BP 140/60 | HR 76 | Ht 66.75 in | Wt 169.0 lb

## 2013-12-26 DIAGNOSIS — R600 Localized edema: Secondary | ICD-10-CM

## 2013-12-26 DIAGNOSIS — I272 Pulmonary hypertension, unspecified: Secondary | ICD-10-CM

## 2013-12-26 DIAGNOSIS — R609 Edema, unspecified: Secondary | ICD-10-CM | POA: Diagnosis not present

## 2013-12-26 DIAGNOSIS — I10 Essential (primary) hypertension: Secondary | ICD-10-CM

## 2013-12-26 DIAGNOSIS — I6529 Occlusion and stenosis of unspecified carotid artery: Secondary | ICD-10-CM

## 2013-12-26 DIAGNOSIS — I5032 Chronic diastolic (congestive) heart failure: Secondary | ICD-10-CM

## 2013-12-26 DIAGNOSIS — I270 Primary pulmonary hypertension: Secondary | ICD-10-CM

## 2013-12-26 DIAGNOSIS — I350 Nonrheumatic aortic (valve) stenosis: Secondary | ICD-10-CM

## 2013-12-26 DIAGNOSIS — J441 Chronic obstructive pulmonary disease with (acute) exacerbation: Secondary | ICD-10-CM

## 2013-12-26 DIAGNOSIS — I480 Paroxysmal atrial fibrillation: Secondary | ICD-10-CM

## 2013-12-26 NOTE — Patient Instructions (Signed)
Your physician recommends that you continue on your current medications as directed. Please refer to the Current Medication list given to you today.  Your physician has requested that you have an echocardiogram. Echocardiography is a painless test that uses sound waves to create images of your heart. It provides your doctor with information about the size and shape of your heart and how well your heart's chambers and valves are working. This procedure takes approximately one hour. There are no restrictions for this procedure.  Your physician wants you to follow-up in: 6 months with Dr. [name].  You will receive a reminder letter in the mail two months in advance. If you don't receive a letter, please call our office to schedule the follow-up appointment.

## 2013-12-26 NOTE — Progress Notes (Signed)
[address], [place] [address] [place], [place]  [address] Phone: [phone] Fax:  [phone]  Date:  12/26/2013   ID:  Jackie Alvarez, DOB 1931-11-29, MRN [id number]  PCP:  Jonathon Bellows, MD  Cardiologist:  Fransico Him, MD    History of Present Illness: Jackie Alvarez is a 80 y.o. female with a history of chronic diastolic CHF, HTN, CKD stage III, remote atrial flutter and mild AS.   She now presents back for followup. She had PNA about a month ago and was very sick and lost 13 pounds.  She is doing much better now.  She denies any chest pain or pressure, SOB, DOE, palpitations, dizziness or syncope. She has trace chronic edema which is stable.    Wt Readings from Last 3 Encounters:  12/26/13 169 lb (76.658 kg)  10/09/13 183 lb (83.008 kg)  07/04/13 183 lb (83.008 kg)     Past Medical History  Diagnosis Date  . Diabetes mellitus   . Hypertension   . Hyperlipidemia   . Ulcer 2009    gatric with history of bleeding - resolved  . COPD (chronic obstructive pulmonary disease)   . Cancer 1994    Left Breast  . Carotid artery occlusion     mild bilateral  . Anxiety   . Central retinal vein occlusion, right eye   . Anemia   . Chronic kidney disease     CKD stage III  . Atrial flutter 2008    remote  . Thyroid disease     toxic multinodular goiter with history of thyrotoxicosis now s/p RAI ablative therapy - now hypothyroid  . Depression   . Peripheral neuropathy   . GERD (gastroesophageal reflux disease)   . Obesity   . Hiatal hernia   . Schatzki's ring   . Cholelithiases   . Chronic diastolic CHF (congestive heart failure)   . Aortic stenosis, mild     echo 12/2012  . Pulmonary HTN     mild PASP 33mmHg by echo 12/2012    Current Outpatient Prescriptions  Medication Sig Dispense Refill  . amLODipine (NORVASC) 10 MG tablet Take 10 mg by mouth daily.        . Ferrous Sulfate (IRON) 325 (65 FE) MG TABS Take 65 mg by mouth 2 (two) times daily.       . fluticasone  (FLONASE) 50 MCG/ACT nasal spray Place 2 sprays into both nostrils daily.      . furosemide (LASIX) 40 MG tablet Take 40 mg by mouth 3 (three) times daily.       Marland Kitchen glipiZIDE (GLUCOTROL XL) 2.5 MG 24 hr tablet Take 2.5 mg by mouth daily with breakfast.       . hydrALAZINE (APRESOLINE) 50 MG tablet Take 100 mg by mouth 3 (three) times daily.       Marland Kitchen levothyroxine (SYNTHROID, LEVOTHROID) 50 MCG tablet Take 88 mcg by mouth daily before breakfast.       . mirtazapine (REMERON) 15 MG tablet Take 30 mg by mouth every morning.       . pioglitazone (ACTOS) 30 MG tablet Take 30 mg by mouth daily.      . simvastatin (ZOCOR) 20 MG tablet Take 20 mg by mouth at bedtime.        . sitaGLIPtin (JANUVIA) 100 MG tablet Take 50 mg by mouth daily.       Marland Kitchen warfarin (COUMADIN) 2.5 MG tablet Take 1.5 mg by mouth as directed. Take  1.5 mg everyday but  TUESDAY   Take  1 mg       No current facility-administered medications for this visit.    Allergies:    Allergies  Allergen Reactions  . Metformin And Related Other (See Comments)    Renal insuff  . Omnipaque [Iohexol] Nausea And Vomiting    unknown    Social History:  The patient  reports that she quit smoking about 16 years ago. Her smoking use included Cigarettes. She has a 45 pack-year smoking history. She has never used smokeless tobacco. She reports that she does not drink alcohol or use illicit drugs.   Family History:  The patient's family history includes Cancer in her father, mother, and sister; Cirrhosis in her father; Diabetes in her sister.   ROS:  Please see the history of present illness.      All other systems reviewed and negative.   PHYSICAL EXAM: VS:  BP 140/60  Pulse 76  Ht 5' 6.75" (1.695 m)  Wt 169 lb (76.658 kg)  BMI 26.68 kg/m2 Well nourished, well developed, in no acute distress HEENT: normal Neck: no JVDleft carotid bruit Cardiac:  normal S1, S2; RRR; 1/6 SM at RUSB Lungs:  clear to auscultation bilaterally, no wheezing,  rhonchi or rales Abd: soft, nontender, no hepatomegaly Ext: no edema Skin: warm and dry Neuro:  CNs 2-12 intact, no focal abnormalities noted     ASSESSMENT AND PLAN:  1. Chronic diastolic CHF - appears well compensated  - continue lasix  2. HTN -BP controlled - continue amlodipine/Hydralazine  3. PAF - maintaining NSR  - continue Warfarin  4. Mild AS by recent echo  5. Mild pulmonary HTN by echo most likely from COPD and diastolic CHF - check 2D echo 6.  COPD per PCP 7.  Carotid artery disease followed by Dr. Kellie Simmering  Followup with me in 6 months   Signed, Fransico Him, MD [facility] 12/26/2013 11:16 AM

## 2013-12-28 ENCOUNTER — Ambulatory Visit (HOSPITAL_COMMUNITY): Payer: Medicare Other | Attending: Cardiology

## 2013-12-28 DIAGNOSIS — I10 Essential (primary) hypertension: Secondary | ICD-10-CM | POA: Diagnosis not present

## 2013-12-28 DIAGNOSIS — E785 Hyperlipidemia, unspecified: Secondary | ICD-10-CM | POA: Insufficient documentation

## 2013-12-28 DIAGNOSIS — I270 Primary pulmonary hypertension: Secondary | ICD-10-CM | POA: Insufficient documentation

## 2013-12-28 DIAGNOSIS — Z7901 Long term (current) use of anticoagulants: Secondary | ICD-10-CM | POA: Diagnosis not present

## 2013-12-28 DIAGNOSIS — E119 Type 2 diabetes mellitus without complications: Secondary | ICD-10-CM | POA: Diagnosis not present

## 2013-12-28 DIAGNOSIS — I4891 Unspecified atrial fibrillation: Secondary | ICD-10-CM | POA: Diagnosis not present

## 2013-12-28 DIAGNOSIS — I272 Pulmonary hypertension, unspecified: Secondary | ICD-10-CM

## 2013-12-28 NOTE — Progress Notes (Signed)
2D Echo completed. 12/28/2013 

## 2014-01-04 DIAGNOSIS — Z7901 Long term (current) use of anticoagulants: Secondary | ICD-10-CM | POA: Diagnosis not present

## 2014-01-04 DIAGNOSIS — Z8679 Personal history of other diseases of the circulatory system: Secondary | ICD-10-CM | POA: Diagnosis not present

## 2014-01-09 ENCOUNTER — Ambulatory Visit: Payer: Medicare Other

## 2014-01-09 ENCOUNTER — Ambulatory Visit (INDEPENDENT_AMBULATORY_CARE_PROVIDER_SITE_OTHER): Payer: Medicare Other

## 2014-01-09 DIAGNOSIS — B351 Tinea unguium: Secondary | ICD-10-CM | POA: Diagnosis not present

## 2014-01-09 DIAGNOSIS — M79673 Pain in unspecified foot: Secondary | ICD-10-CM | POA: Diagnosis not present

## 2014-01-09 DIAGNOSIS — E1140 Type 2 diabetes mellitus with diabetic neuropathy, unspecified: Secondary | ICD-10-CM

## 2014-01-09 NOTE — Progress Notes (Signed)
   Subjective:    Patient ID: Jackie Alvarez, female    DOB: Jul 12, 1931, 80 y.o.   MRN: [id number]  HPI  Pt presents for nail debridement Review of Systemsno new systemic findings or changes noted     Objective:   Physical Exam Neurovascular status is intact and unchanged patient does have neuropathy decreased sensation epicritic the forefoot toes and digits history partial amputation left hallux and second digit due to history of gastric cover minus ischemic changes ages noted nails thick brittle crumbly friable dystrophic 1 through 5 on the right 3 through 5 left. Has partial nail on the right on the left hallux although not completely nail partial nail spicule is identified Maynard exam otherwise unremarkable noncontributory does not have any open wounds no ulcers significant arthropathy noted as well       Assessment & Plan:  Assessment diabetes history peripheral neuropathy and angiopathy history of vascular compromise. There is thick brittle dystrophic friable gratified nails debrided 8 return for future palliative care on an as-needed basis every 3 months as recommended next  Harriet Masson DPM

## 2014-01-11 DIAGNOSIS — Z7901 Long term (current) use of anticoagulants: Secondary | ICD-10-CM | POA: Diagnosis not present

## 2014-01-16 DIAGNOSIS — Z23 Encounter for immunization: Secondary | ICD-10-CM | POA: Diagnosis not present

## 2014-01-16 DIAGNOSIS — E039 Hypothyroidism, unspecified: Secondary | ICD-10-CM | POA: Diagnosis not present

## 2014-01-16 DIAGNOSIS — Z7901 Long term (current) use of anticoagulants: Secondary | ICD-10-CM | POA: Diagnosis not present

## 2014-01-16 DIAGNOSIS — E876 Hypokalemia: Secondary | ICD-10-CM | POA: Diagnosis not present

## 2014-01-16 DIAGNOSIS — E119 Type 2 diabetes mellitus without complications: Secondary | ICD-10-CM | POA: Diagnosis not present

## 2014-01-16 DIAGNOSIS — Z5181 Encounter for therapeutic drug level monitoring: Secondary | ICD-10-CM | POA: Diagnosis not present

## 2014-01-16 DIAGNOSIS — Z0000 Encounter for general adult medical examination without abnormal findings: Secondary | ICD-10-CM | POA: Diagnosis not present

## 2014-01-16 DIAGNOSIS — E785 Hyperlipidemia, unspecified: Secondary | ICD-10-CM | POA: Diagnosis not present

## 2014-01-24 DIAGNOSIS — Z7901 Long term (current) use of anticoagulants: Secondary | ICD-10-CM | POA: Diagnosis not present

## 2014-01-24 DIAGNOSIS — E876 Hypokalemia: Secondary | ICD-10-CM | POA: Diagnosis not present

## 2014-01-24 DIAGNOSIS — Z8679 Personal history of other diseases of the circulatory system: Secondary | ICD-10-CM | POA: Diagnosis not present

## 2014-01-31 DIAGNOSIS — Z8679 Personal history of other diseases of the circulatory system: Secondary | ICD-10-CM | POA: Diagnosis not present

## 2014-01-31 DIAGNOSIS — Z7901 Long term (current) use of anticoagulants: Secondary | ICD-10-CM | POA: Diagnosis not present

## 2014-01-31 DIAGNOSIS — H903 Sensorineural hearing loss, bilateral: Secondary | ICD-10-CM | POA: Diagnosis not present

## 2014-01-31 DIAGNOSIS — H7201 Central perforation of tympanic membrane, right ear: Secondary | ICD-10-CM | POA: Diagnosis not present

## 2014-01-31 DIAGNOSIS — H9313 Tinnitus, bilateral: Secondary | ICD-10-CM | POA: Diagnosis not present

## 2014-02-09 DIAGNOSIS — Z8679 Personal history of other diseases of the circulatory system: Secondary | ICD-10-CM | POA: Diagnosis not present

## 2014-02-09 DIAGNOSIS — Z7901 Long term (current) use of anticoagulants: Secondary | ICD-10-CM | POA: Diagnosis not present

## 2014-02-15 ENCOUNTER — Encounter (HOSPITAL_COMMUNITY): Payer: Self-pay | Admitting: Surgery

## 2014-02-19 DIAGNOSIS — E119 Type 2 diabetes mellitus without complications: Secondary | ICD-10-CM | POA: Diagnosis not present

## 2014-02-19 DIAGNOSIS — Z7901 Long term (current) use of anticoagulants: Secondary | ICD-10-CM | POA: Diagnosis not present

## 2014-02-23 DIAGNOSIS — R809 Proteinuria, unspecified: Secondary | ICD-10-CM | POA: Diagnosis not present

## 2014-02-23 DIAGNOSIS — N183 Chronic kidney disease, stage 3 (moderate): Secondary | ICD-10-CM | POA: Diagnosis not present

## 2014-02-23 DIAGNOSIS — I129 Hypertensive chronic kidney disease with stage 1 through stage 4 chronic kidney disease, or unspecified chronic kidney disease: Secondary | ICD-10-CM | POA: Diagnosis not present

## 2014-02-23 DIAGNOSIS — D631 Anemia in chronic kidney disease: Secondary | ICD-10-CM | POA: Diagnosis not present

## 2014-02-27 DIAGNOSIS — E119 Type 2 diabetes mellitus without complications: Secondary | ICD-10-CM | POA: Diagnosis not present

## 2014-02-27 DIAGNOSIS — Z961 Presence of intraocular lens: Secondary | ICD-10-CM | POA: Diagnosis not present

## 2014-02-27 DIAGNOSIS — H35371 Puckering of macula, right eye: Secondary | ICD-10-CM | POA: Diagnosis not present

## 2014-02-27 DIAGNOSIS — H524 Presbyopia: Secondary | ICD-10-CM | POA: Diagnosis not present

## 2014-03-01 DIAGNOSIS — Z7901 Long term (current) use of anticoagulants: Secondary | ICD-10-CM | POA: Diagnosis not present

## 2014-03-01 DIAGNOSIS — Z8679 Personal history of other diseases of the circulatory system: Secondary | ICD-10-CM | POA: Diagnosis not present

## 2014-03-06 DIAGNOSIS — I10 Essential (primary) hypertension: Secondary | ICD-10-CM | POA: Diagnosis not present

## 2014-03-06 DIAGNOSIS — J208 Acute bronchitis due to other specified organisms: Secondary | ICD-10-CM | POA: Diagnosis not present

## 2014-03-19 DIAGNOSIS — N951 Menopausal and female climacteric states: Secondary | ICD-10-CM | POA: Diagnosis not present

## 2014-03-19 DIAGNOSIS — M810 Age-related osteoporosis without current pathological fracture: Secondary | ICD-10-CM | POA: Diagnosis not present

## 2014-04-05 DIAGNOSIS — Z7901 Long term (current) use of anticoagulants: Secondary | ICD-10-CM | POA: Diagnosis not present

## 2014-04-09 ENCOUNTER — Ambulatory Visit (INDEPENDENT_AMBULATORY_CARE_PROVIDER_SITE_OTHER): Payer: Medicare Other | Admitting: Podiatry

## 2014-04-09 ENCOUNTER — Encounter: Payer: Self-pay | Admitting: Podiatry

## 2014-04-09 DIAGNOSIS — B351 Tinea unguium: Secondary | ICD-10-CM | POA: Diagnosis not present

## 2014-04-09 DIAGNOSIS — M79673 Pain in unspecified foot: Secondary | ICD-10-CM

## 2014-04-09 NOTE — Progress Notes (Signed)
Patient ID: Jackie Alvarez, female   DOB: April 11, 1931, 81 y.o.   MRN: [id number]  Subjective: Patient presents today requesting nail debridement  Objective: Amputated second left toe The remaining toenails are elongated, brittle, upper trophic and tender to palpation Small eschar medial second right toe Well-healed surgical scar left lower leg  Assessment: History of peripheral arterial disease History of diabetic neuropathy Symptomatic onychomycoses 9  Plan: Debridement of toenails 9 without a bleeding  Reappoint 3 months

## 2014-04-10 ENCOUNTER — Ambulatory Visit: Payer: Medicare Other | Admitting: Family

## 2014-04-10 ENCOUNTER — Other Ambulatory Visit: Payer: Medicare Other

## 2014-04-10 ENCOUNTER — Other Ambulatory Visit (HOSPITAL_COMMUNITY): Payer: Medicare Other

## 2014-04-10 ENCOUNTER — Encounter (HOSPITAL_COMMUNITY): Payer: Medicare Other

## 2014-04-11 ENCOUNTER — Encounter: Payer: Self-pay | Admitting: Cardiology

## 2014-04-12 ENCOUNTER — Other Ambulatory Visit (HOSPITAL_COMMUNITY): Payer: Medicare Other

## 2014-04-12 ENCOUNTER — Ambulatory Visit: Payer: Medicare Other | Admitting: Family

## 2014-04-17 ENCOUNTER — Encounter (HOSPITAL_COMMUNITY): Payer: Medicare Other

## 2014-04-17 ENCOUNTER — Ambulatory Visit: Payer: Medicare Other | Admitting: Family

## 2014-04-17 ENCOUNTER — Other Ambulatory Visit (HOSPITAL_COMMUNITY): Payer: Medicare Other

## 2014-05-02 IMAGING — CT CT ANGIO CHEST
2 of 6 series · 19 of 36 positions shown · IV contrast (OMNIPAQUE)
Comparison: 07/31/2005

CLINICAL DATA: Dyspnea.  Rule out pulmonary embolism.

CT ANGIOGRAPHY CHEST
TECHNIQUE: Multidetector CT imaging of the chest using the
standard protocol during bolus administration of intravenous
contrast. Multiplanar reconstructed images including MIPs were
obtained and reviewed to evaluate the vascular anatomy.
Contrast: 80mL OMNIPAQUE IOHEXOL 350 MG/ML SOLN

[Series 6: pe thins @ 1mm · axial · 0.81mm/px · z∈[-292,-1]mm · 18 of 325 slices shown]
[im 17/325  lung]
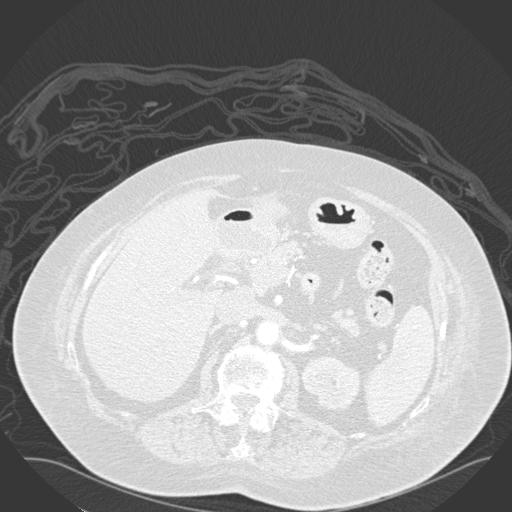
[im 33/325  mediastinal]
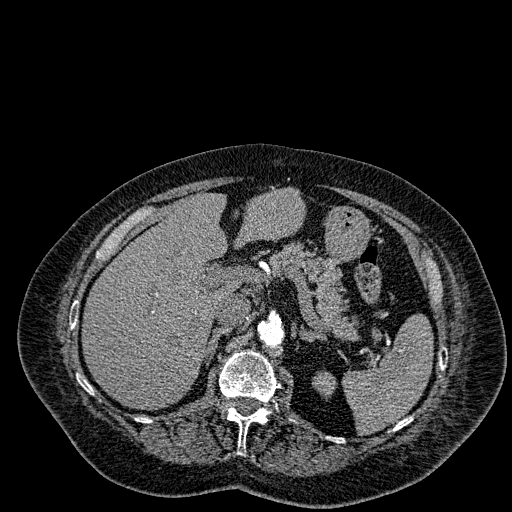
[im 49/325  lung]
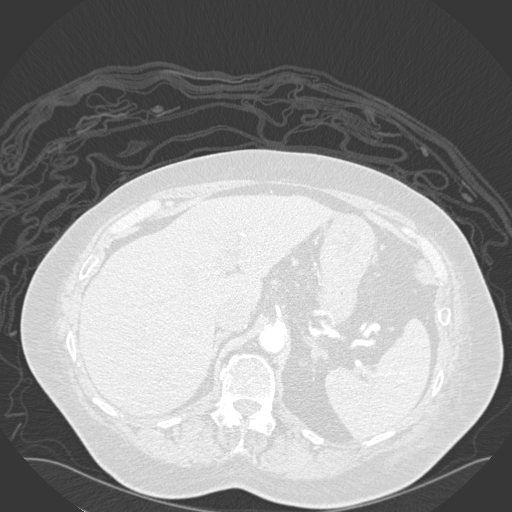
[im 65/325  mediastinal]
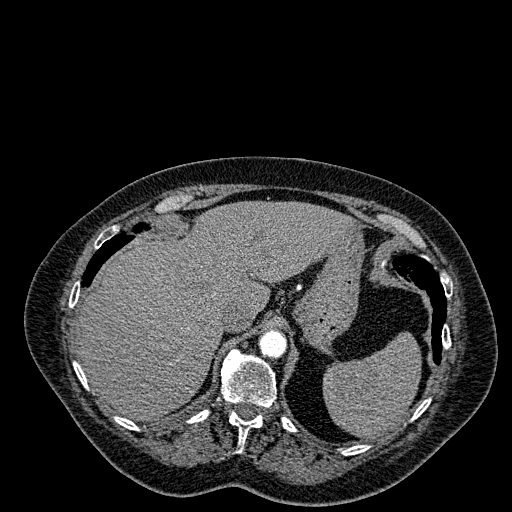
[im 82/325  lung]
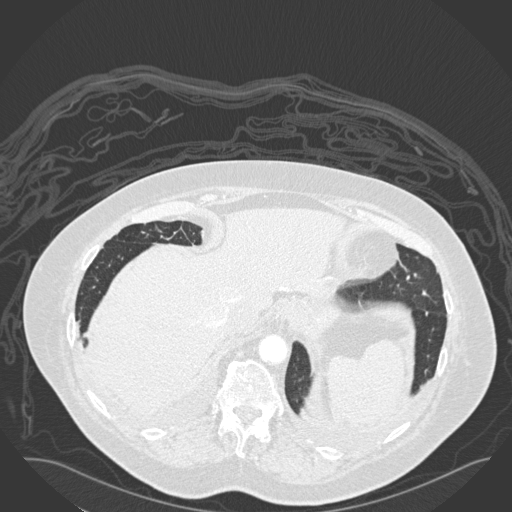
[im 98/325  mediastinal]
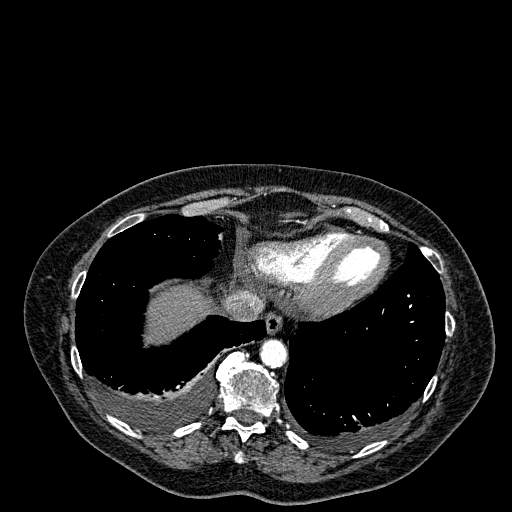
[im 114/325  lung]
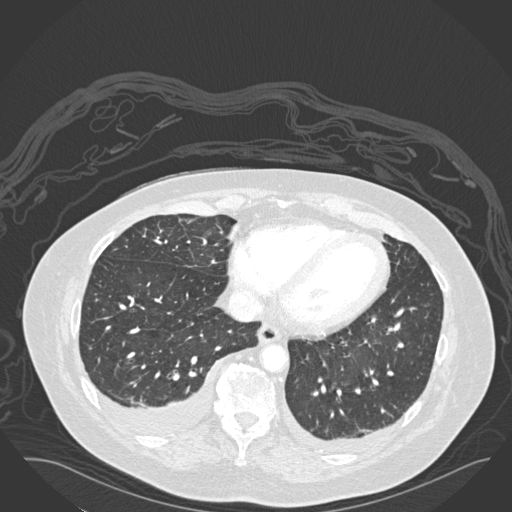
[im 130/325  mediastinal]
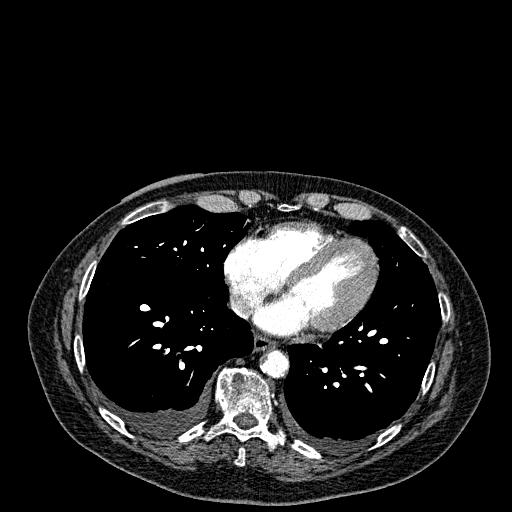
[im 146/325  lung]
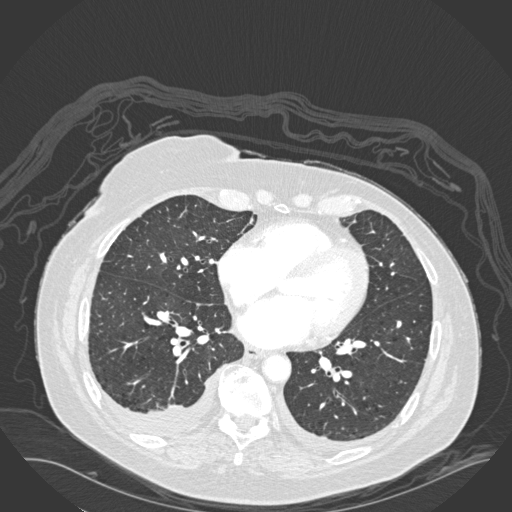
[im 179/325  mediastinal]
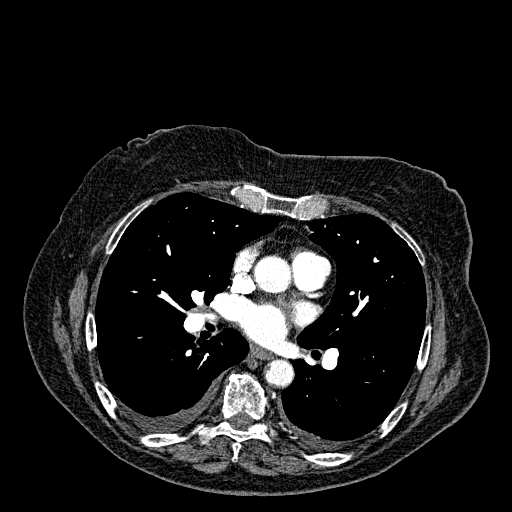
[im 195/325  lung]
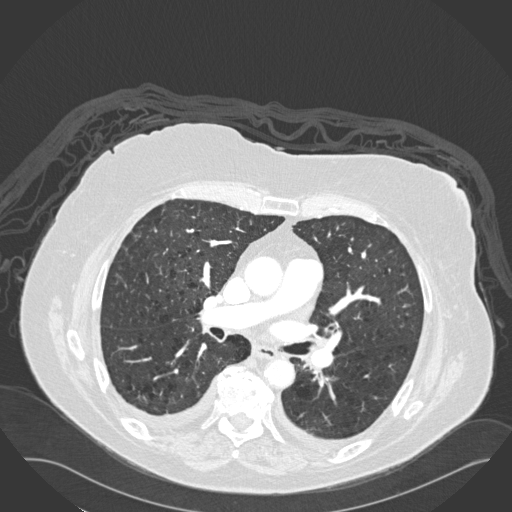
[im 211/325  mediastinal]
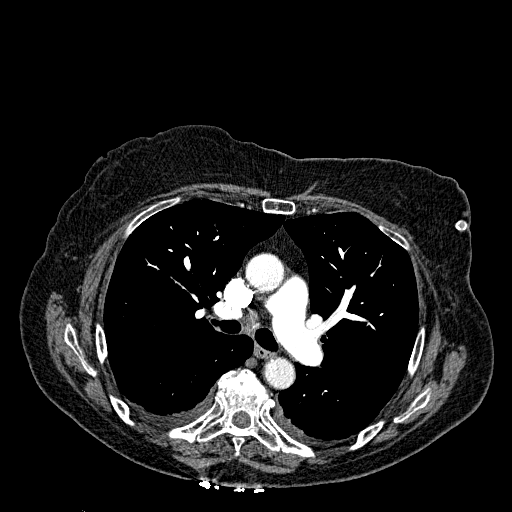
[im 227/325  lung]
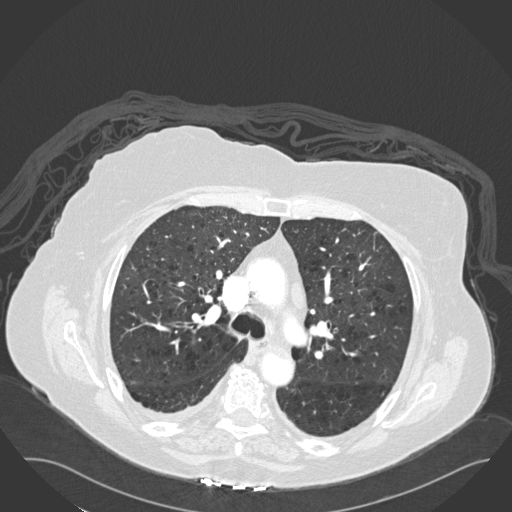
[im 244/325  mediastinal]
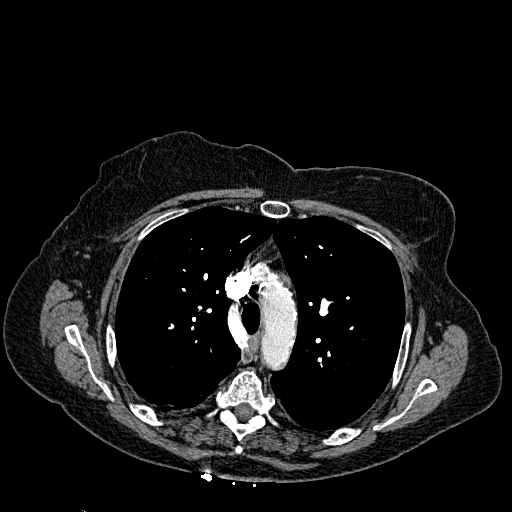
[im 260/325  lung]
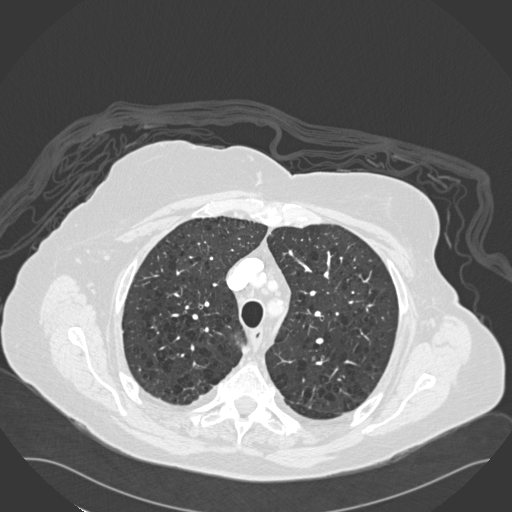
[im 276/325  mediastinal]
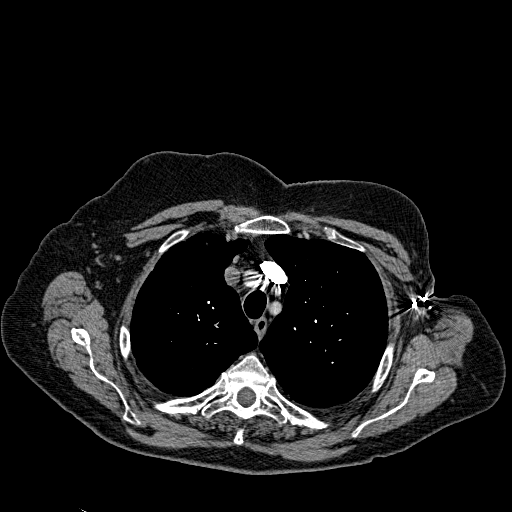
[im 292/325  lung]
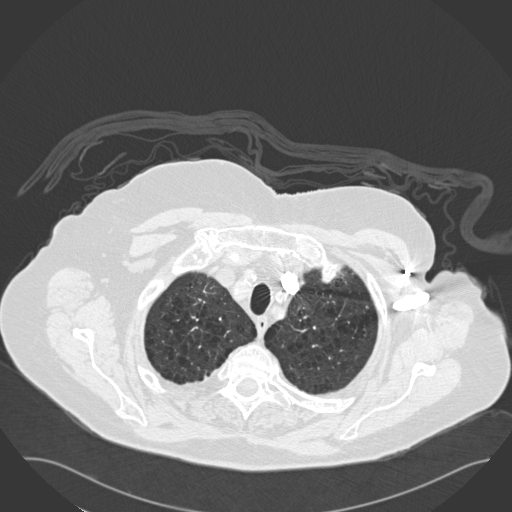
[im 308/325  mediastinal]
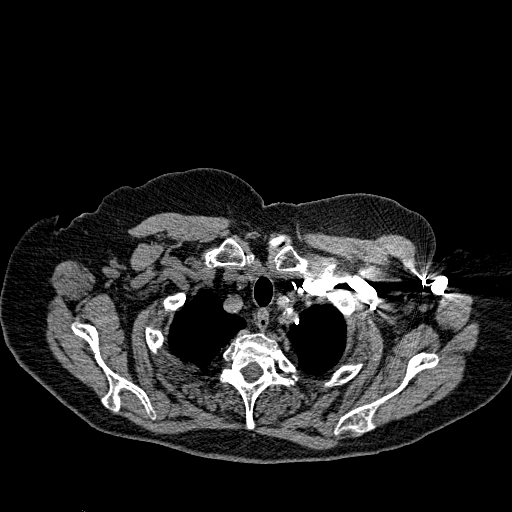

[Series 602: cor · coronal · 0.81mm/px · 1 of 85 slices shown]
[im 43/85  mediastinal]
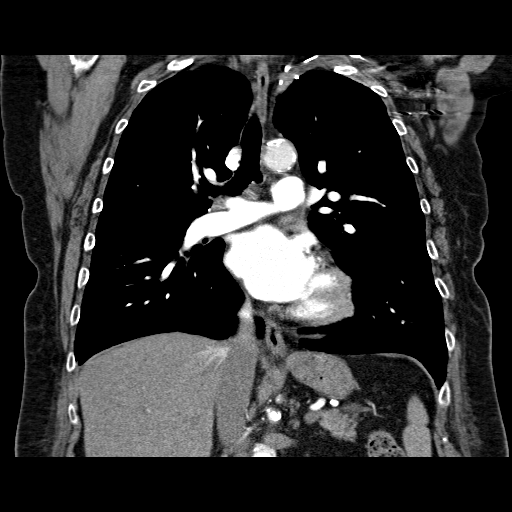

[19 of 36 positions shown; findings below may reference images not displayed]

FINDINGS: No pulmonary embolism is seen. Airways are patent without
endobronchial lesions. No pneumothorax. There is a moderate right
and small left pleural effusion with adjacent compressive
atelectasis. Two 2 mm pulmonary nodules are seen in the left lower
lobe (images 64 and 45 of series 7). Other pulmonary nodules are
unchanged from 4330 and considered benign. Changes of centrilobular
emphysema are noted.

Heart is normal size with no pericardial effusion. Aorta is normal
in caliber. Visualized thyroid continues to demonstrate hypodense
nodules. Right thyroid lobe calcification is unchanged. No
pathologically enlarged hilar, mediastinal, or axillary lymph nodes
are seen. No change in prominent AP window lymph node.

Gallbladder is surgically absent. Visualized portion of the upper
abdomen appears otherwise unremarkable. No evidence of acute
osseous abnormalities.
IMPRESSION: 1. No evidence of pulmonary embolism.

2. Mild right and small left pleural effusions.

3. Two 2 mm left lower lobe pulmonary nodules, not seen on the 4330
examination. Given risk factors for bronchogenic carcinoma, follow-
up chest CT at 1 year is recommended.  This recommendation follows
the consensus statement:  Guidelines for Management of Small
Pulmonary Nodules Detected on CT Scans:  A Statement from the

## 2014-05-08 ENCOUNTER — Encounter: Payer: Self-pay | Admitting: Family

## 2014-05-09 ENCOUNTER — Ambulatory Visit (HOSPITAL_COMMUNITY)
Admission: RE | Admit: 2014-05-09 | Discharge: 2014-05-09 | Disposition: A | Discharge status: Home / Self Care | Payer: Medicare Other | Source: Ambulatory Visit | Attending: Family | Admitting: Family

## 2014-05-09 ENCOUNTER — Ambulatory Visit: Payer: Medicare Other | Admitting: Family

## 2014-05-09 DIAGNOSIS — M79609 Pain in unspecified limb: Secondary | ICD-10-CM

## 2014-05-09 DIAGNOSIS — Z48812 Encounter for surgical aftercare following surgery on the circulatory system: Secondary | ICD-10-CM

## 2014-05-09 DIAGNOSIS — I739 Peripheral vascular disease, unspecified: Secondary | ICD-10-CM

## 2014-05-09 DIAGNOSIS — I6523 Occlusion and stenosis of bilateral carotid arteries: Secondary | ICD-10-CM

## 2014-05-17 DIAGNOSIS — N183 Chronic kidney disease, stage 3 (moderate): Secondary | ICD-10-CM | POA: Diagnosis not present

## 2014-05-17 DIAGNOSIS — I4891 Unspecified atrial fibrillation: Secondary | ICD-10-CM | POA: Diagnosis not present

## 2014-05-17 DIAGNOSIS — F3342 Major depressive disorder, recurrent, in full remission: Secondary | ICD-10-CM | POA: Diagnosis not present

## 2014-05-17 DIAGNOSIS — Z5181 Encounter for therapeutic drug level monitoring: Secondary | ICD-10-CM | POA: Diagnosis not present

## 2014-05-17 DIAGNOSIS — I5032 Chronic diastolic (congestive) heart failure: Secondary | ICD-10-CM | POA: Diagnosis not present

## 2014-05-17 DIAGNOSIS — J439 Emphysema, unspecified: Secondary | ICD-10-CM | POA: Diagnosis not present

## 2014-05-17 DIAGNOSIS — E1140 Type 2 diabetes mellitus with diabetic neuropathy, unspecified: Secondary | ICD-10-CM | POA: Diagnosis not present

## 2014-05-17 DIAGNOSIS — E039 Hypothyroidism, unspecified: Secondary | ICD-10-CM | POA: Diagnosis not present

## 2014-05-17 DIAGNOSIS — I10 Essential (primary) hypertension: Secondary | ICD-10-CM | POA: Diagnosis not present

## 2014-05-17 DIAGNOSIS — M810 Age-related osteoporosis without current pathological fracture: Secondary | ICD-10-CM | POA: Diagnosis not present

## 2014-05-17 DIAGNOSIS — E780 Pure hypercholesterolemia: Secondary | ICD-10-CM | POA: Diagnosis not present

## 2014-05-31 DIAGNOSIS — Z7901 Long term (current) use of anticoagulants: Secondary | ICD-10-CM | POA: Diagnosis not present

## 2014-06-04 DIAGNOSIS — N189 Chronic kidney disease, unspecified: Secondary | ICD-10-CM | POA: Diagnosis not present

## 2014-06-04 DIAGNOSIS — N183 Chronic kidney disease, stage 3 (moderate): Secondary | ICD-10-CM | POA: Diagnosis not present

## 2014-06-04 DIAGNOSIS — I129 Hypertensive chronic kidney disease with stage 1 through stage 4 chronic kidney disease, or unspecified chronic kidney disease: Secondary | ICD-10-CM | POA: Diagnosis not present

## 2014-06-04 DIAGNOSIS — R809 Proteinuria, unspecified: Secondary | ICD-10-CM | POA: Diagnosis not present

## 2014-06-04 DIAGNOSIS — N2581 Secondary hyperparathyroidism of renal origin: Secondary | ICD-10-CM | POA: Diagnosis not present

## 2014-06-04 DIAGNOSIS — D631 Anemia in chronic kidney disease: Secondary | ICD-10-CM | POA: Diagnosis not present

## 2014-06-12 DIAGNOSIS — Z7901 Long term (current) use of anticoagulants: Secondary | ICD-10-CM | POA: Diagnosis not present

## 2014-06-19 DIAGNOSIS — Z7901 Long term (current) use of anticoagulants: Secondary | ICD-10-CM | POA: Diagnosis not present

## 2014-06-26 ENCOUNTER — Encounter: Payer: Self-pay | Admitting: Cardiology

## 2014-06-26 ENCOUNTER — Ambulatory Visit (INDEPENDENT_AMBULATORY_CARE_PROVIDER_SITE_OTHER): Payer: Medicare Other | Admitting: Cardiology

## 2014-06-26 VITALS — BP 130/50 | HR 67 | Ht 66.0 in | Wt 154.0 lb

## 2014-06-26 DIAGNOSIS — I480 Paroxysmal atrial fibrillation: Secondary | ICD-10-CM | POA: Diagnosis not present

## 2014-06-26 DIAGNOSIS — I5032 Chronic diastolic (congestive) heart failure: Secondary | ICD-10-CM

## 2014-06-26 DIAGNOSIS — I10 Essential (primary) hypertension: Secondary | ICD-10-CM | POA: Diagnosis not present

## 2014-06-26 DIAGNOSIS — I350 Nonrheumatic aortic (valve) stenosis: Secondary | ICD-10-CM | POA: Diagnosis not present

## 2014-06-26 DIAGNOSIS — I6523 Occlusion and stenosis of bilateral carotid arteries: Secondary | ICD-10-CM

## 2014-06-26 NOTE — Patient Instructions (Signed)
Medication Instructions:  Your physician recommends that you continue on your current medications as directed. Please refer to the Current Medication list given to you today.   Labwork: None  Testing/Procedures: None  Follow-Up: Your physician wants you to follow-up in: 6 months with Dr. [name]. You will receive a reminder letter in the mail two months in advance. If you don't receive a letter, please call our office to schedule the follow-up appointment.

## 2014-06-26 NOTE — Progress Notes (Signed)
Cardiology Office Note   Date:  06/26/2014   ID:  Jackie Alvarez, DOB Jun 11, 1931, MRN [id number]  PCP:  Jonathon Bellows, MD    Chief Complaint  Patient presents with  . Congestive Heart Failure  . Follow-up    hypertension  . Atrial Fibrillation      History of Present Illness: Jackie Alvarez is a 81 y.o. female with a history of chronic diastolic CHF, HTN, CKD stage III, remote atrial flutter and mild AS. She had an echo last fall to followup on AS and this showed mildly thickened AV with no AS.  She now presents back for followup.  She denies any chest pain or pressure, SOB, DOE, palpitations, LE edema, dizziness or syncope.  She does complain of some fatigue and lack of appetite.       Past Medical History  Diagnosis Date  . Diabetes mellitus   . Hypertension   . Hyperlipidemia   . Ulcer 2009    gatric with history of bleeding - resolved  . COPD (chronic obstructive pulmonary disease)   . Cancer 1994    Left Breast  . Carotid artery occlusion     mild bilateral  . Anxiety   . Central retinal vein occlusion, right eye   . Anemia   . Chronic kidney disease     CKD stage III  . Atrial flutter 2008    remote  . Thyroid disease     toxic multinodular goiter with history of thyrotoxicosis now s/p RAI ablative therapy - now hypothyroid  . Depression   . Peripheral neuropathy   . GERD (gastroesophageal reflux disease)   . Obesity   . Hiatal hernia   . Schatzki's ring   . Cholelithiases   . Chronic diastolic CHF (congestive heart failure)   . Pulmonary HTN     mild PASP 2mmHg by echo 12/2012    Past Surgical History  Procedure Laterality Date  . Wrist surgery  02/2009  . Cholecystectomy      Gall Bladder  . Pr vein bypass graft,aorto-fem-pop    . Abdominal hysterectomy      with BSO  . Breast lumpectomy    . Abdominal aortagram  Jan. 18, 2013  . Hip surgery Left   . Abdominal aortagram N/A 03/17/2011    Procedure: ABDOMINAL Maxcine Ham;  Surgeon: Serafina Mitchell, MD;  Location: [facility] CATH LAB;  Service: Cardiovascular;  Laterality: N/A;     Current Outpatient Prescriptions  Medication Sig Dispense Refill  . amLODipine (NORVASC) 10 MG tablet Take 10 mg by mouth daily.      . carvedilol (COREG) 6.25 MG tablet Take 6.25 mg by mouth 2 (two) times daily.    . Ferrous Sulfate (IRON) 325 (65 FE) MG TABS Take 65 mg by mouth 2 (two) times daily.     . fluticasone (FLONASE) 50 MCG/ACT nasal spray Place 2 sprays into both nostrils daily.    . furosemide (LASIX) 40 MG tablet Take 40 mg by mouth daily. Take 2 tablet in AM and take 1 tablet in the PM    . glipiZIDE (GLUCOTROL XL) 2.5 MG 24 hr tablet Take 2.5 mg by mouth daily with breakfast.     . hydrALAZINE (APRESOLINE) 100 MG tablet Take 100 mg by mouth 3 (three) times daily.    Marland Kitchen levothyroxine (SYNTHROID, LEVOTHROID) 88 MCG tablet Take 88 mcg by mouth daily before breakfast.    . mirtazapine (REMERON) 30 MG tablet Take 30 mg  by mouth every morning.    . pioglitazone (ACTOS) 30 MG tablet Take 30 mg by mouth daily.    . potassium chloride SA (K-DUR,KLOR-CON) 20 MEQ tablet Take 20 mEq by mouth daily.    . simvastatin (ZOCOR) 20 MG tablet Take 20 mg by mouth at bedtime.      . sitaGLIPtin (JANUVIA) 100 MG tablet Take 50 mg by mouth daily.     Marland Kitchen warfarin (COUMADIN) 1 MG tablet Take 1 mg by mouth as directed. Take 2 mg on Sunday, Monday, Wednesday and Friday. 1.5 mg on Tuesday and Thursday     No current facility-administered medications for this visit.    Allergies:   Metformin and related and Omnipaque    Social History:  The patient  reports that she quit smoking about 17 years ago. Her smoking use included Cigarettes. She has a 45 pack-year smoking history. She has never used smokeless tobacco. She reports that she does not drink alcohol or use illicit drugs.   Family History:  The patient's family history includes Cancer in her father, mother, and sister; Cirrhosis in her father; Diabetes in her  sister.    ROS:  Please see the history of present illness.   Otherwise, review of systems are positive for none.   All other systems are reviewed and negative.    PHYSICAL EXAM: VS:  BP 130/50 mmHg  Pulse 67  Ht 5\' 6"  (1.676 m)  Wt 154 lb (69.854 kg)  BMI 24.87 kg/m2  SpO2 96% , BMI Body mass index is 24.87 kg/(m^2). GEN: Well nourished, well developed, in no acute distress HEENT: normal Neck: no JVD, carotid bruits, or masses Cardiac: RRR; no murmurs, rubs, or gallops,no edema  Respiratory:  clear to auscultation bilaterally, normal work of breathing GI: soft, nontender, nondistended, + BS MS: no deformity or atrophy Skin: warm and dry, no rash Neuro:  Strength and sensation are intact Psych: euthymic mood, full affect   EKG:  EKG was ordered today showed with nonspecific ST abnormality    Recent Labs: No results found for requested labs within last 365 days.    Lipid Panel No results found for: CHOL, TRIG, HDL, CHOLHDL, VLDL, LDLCALC, LDLDIRECT    Wt Readings from Last 3 Encounters:  06/26/14 154 lb (69.854 kg)  12/26/13 169 lb (76.658 kg)  10/09/13 183 lb (83.008 kg)     ASSESSMENT AND PLAN:  1. Chronic diastolic CHF - appears well compensated  - continue lasix.  BMET followed by renal med.   2. HTN -BP controlled - continue amlodipine/Hydralazine/Coreg  3. PAF - maintaining NSR  - continue Warfarin/coreg 4. Mild pulmonary HTN by echo most likely from COPD and diastolic CHF - repeat echo 12/-2015 with none 5. COPD per PCP 6. Carotid artery disease followed by Dr. Kellie Simmering   Current medicines are reviewed at length with the patient today.  The patient does not have concerns regarding medicines.  The following changes have been made:  no change  Labs/ tests ordered today include: see above assessment and plan  Orders Placed This Encounter  Procedures  . EKG 12-Lead     Disposition:   FU with me in 6 months   Signed, Sueanne Margarita, MD    06/26/2014 3:44 PM    [facility] [place], [place], [place]  [address] Phone: [phone]; Fax: [phone]

## 2014-06-28 ENCOUNTER — Encounter: Payer: Self-pay | Admitting: Family

## 2014-06-29 ENCOUNTER — Ambulatory Visit (INDEPENDENT_AMBULATORY_CARE_PROVIDER_SITE_OTHER)
Admission: RE | Admit: 2014-06-29 | Discharge: 2014-06-29 | Disposition: A | Discharge status: Home / Self Care | Payer: Medicare Other | Source: Ambulatory Visit | Attending: Family | Admitting: Family

## 2014-06-29 ENCOUNTER — Encounter: Payer: Self-pay | Admitting: Family

## 2014-06-29 ENCOUNTER — Ambulatory Visit (HOSPITAL_COMMUNITY)
Admission: RE | Admit: 2014-06-29 | Discharge: 2014-06-29 | Disposition: A | Discharge status: Home / Self Care | Payer: Medicare Other | Source: Ambulatory Visit | Attending: Family | Admitting: Family

## 2014-06-29 ENCOUNTER — Ambulatory Visit (INDEPENDENT_AMBULATORY_CARE_PROVIDER_SITE_OTHER): Payer: Medicare Other | Admitting: Family

## 2014-06-29 ENCOUNTER — Other Ambulatory Visit: Payer: Self-pay | Admitting: Family

## 2014-06-29 VITALS — BP 134/57 | HR 64 | Ht 66.0 in | Wt 153.0 lb

## 2014-06-29 DIAGNOSIS — Z9889 Other specified postprocedural states: Secondary | ICD-10-CM | POA: Diagnosis not present

## 2014-06-29 DIAGNOSIS — I6523 Occlusion and stenosis of bilateral carotid arteries: Secondary | ICD-10-CM

## 2014-06-29 DIAGNOSIS — Z48812 Encounter for surgical aftercare following surgery on the circulatory system: Secondary | ICD-10-CM

## 2014-06-29 DIAGNOSIS — Z87891 Personal history of nicotine dependence: Secondary | ICD-10-CM

## 2014-06-29 DIAGNOSIS — Z95828 Presence of other vascular implants and grafts: Secondary | ICD-10-CM

## 2014-06-29 DIAGNOSIS — I739 Peripheral vascular disease, unspecified: Secondary | ICD-10-CM

## 2014-06-29 DIAGNOSIS — R0989 Other specified symptoms and signs involving the circulatory and respiratory systems: Secondary | ICD-10-CM | POA: Insufficient documentation

## 2014-06-29 NOTE — Progress Notes (Addendum)
[facility] [place] HISTORY AND PHYSICAL -PAD  History of Present Illness July Jackie Alvarez is a 81 y.o. female patient of Dr. Kellie Simmering followed for known carotid stenosis as well as a past vascular history of multiple surgeries including an aortobifem bypass graft in 1998 and 2001 a left profunda femoris to peroneal bypass graft using lesser saphenous vein with multiple revisions of the distal anastomosis.  She returns today for follow up.  Patient has Negative history of TIA or stroke symptom. The patient denies amaurosis fugax or monocular blindness. The patient denies facial drooping.  Pt. denies hemiplegia. The patient denies receptive or expressive aphasia.  She had pneumonia October 2015, was treated as an out patient, lost her appetite and lost 32 pounds; since this weight loss she has had no more claudication. She denies post prandial abdominal pain, denies any abdominal or back pain.   Pt denies steal symptoms in either arm.   She sees Dr. Mercy Moore for CRF, he added carvedilol.   Pt Diabetic: Yes, pt states in good control  Pt smoker: former smoker, quit in 1998   Pt meds include:  Statin : Yes  ASA: No  Other anticoagulants/antiplatelets: coumadin, possibly for atrial flutter, was started Oct., 2014, per pt., her heart rate was in the 30's, does not have a pacemaker.    Past Medical History  Diagnosis Date  . Diabetes mellitus   . Hypertension   . Hyperlipidemia   . Ulcer 2009    gatric with history of bleeding - resolved  . COPD (chronic obstructive pulmonary disease)   . Cancer 1994    Left Breast  . Carotid artery occlusion     mild bilateral  . Anxiety   . Central retinal vein occlusion, right eye   . Anemia   . Chronic kidney disease     CKD stage III  . Atrial flutter 2008    remote  . Thyroid disease     toxic multinodular goiter with history of thyrotoxicosis now s/p RAI ablative therapy - now hypothyroid  . Depression   .  Peripheral neuropathy   . GERD (gastroesophageal reflux disease)   . Obesity   . Hiatal hernia   . Schatzki's ring   . Cholelithiases   . Chronic diastolic CHF (congestive heart failure)   . Pulmonary HTN     mild PASP 93mmHg by echo 12/2012    Social History History  Substance Use Topics  . Smoking status: Former Smoker -- 1.00 packs/day for 45 years    Types: Cigarettes    Quit date: 01/26/1997  . Smokeless tobacco: Never Used  . Alcohol Use: No    Family History Family History  Problem Relation Age of Onset  . Cancer Mother     stomach  . Cancer Father     liver  . Cirrhosis Father   . Cancer Sister   . Diabetes Sister     Past Surgical History  Procedure Laterality Date  . Wrist surgery  02/2009  . Cholecystectomy      Gall Bladder  . Pr vein bypass graft,aorto-fem-pop    . Abdominal hysterectomy      with BSO  . Breast lumpectomy    . Abdominal aortagram  Jan. 18, 2013  . Hip surgery Left   . Abdominal aortagram N/A 03/17/2011    Procedure: ABDOMINAL Maxcine Ham;  Surgeon: Serafina Mitchell, MD;  Location: [facility] CATH LAB;  Service: Cardiovascular;  Laterality: N/A;    Allergies  Allergen Reactions  . Metformin And Related Other (See Comments)    Renal insuff  . Omnipaque [Iohexol] Nausea And Vomiting    unknown    Current Outpatient Prescriptions  Medication Sig Dispense Refill  . amLODipine (NORVASC) 10 MG tablet Take 10 mg by mouth daily.      . carvedilol (COREG) 6.25 MG tablet Take 6.25 mg by mouth 2 (two) times daily.    . Ferrous Sulfate (IRON) 325 (65 FE) MG TABS Take 65 mg by mouth 2 (two) times daily.     . fluticasone (FLONASE) 50 MCG/ACT nasal spray Place 2 sprays into both nostrils daily.    . furosemide (LASIX) 40 MG tablet Take 40 mg by mouth daily. Take 2 tablet in AM and take 1 tablet in the PM    . glipiZIDE (GLUCOTROL XL) 2.5 MG 24 hr tablet Take 2.5 mg by mouth daily with breakfast.     . hydrALAZINE (APRESOLINE) 100 MG tablet Take 100 mg  by mouth 3 (three) times daily.    Marland Kitchen levothyroxine (SYNTHROID, LEVOTHROID) 88 MCG tablet Take 88 mcg by mouth daily before breakfast.    . mirtazapine (REMERON) 30 MG tablet Take 30 mg by mouth every morning.    . pioglitazone (ACTOS) 30 MG tablet Take 30 mg by mouth daily.    . potassium chloride SA (K-DUR,KLOR-CON) 20 MEQ tablet Take 20 mEq by mouth daily.    . simvastatin (ZOCOR) 20 MG tablet Take 20 mg by mouth at bedtime.      . sitaGLIPtin (JANUVIA) 100 MG tablet Take 50 mg by mouth daily.     Marland Kitchen warfarin (COUMADIN) 1 MG tablet Take 1 mg by mouth as directed. Take 2 mg on Sunday, Monday, Wednesday and Friday. 1.5 mg on Tuesday and Thursday     No current facility-administered medications for this visit.    ROS: See HPI for pertinent positives and negatives.   Physical Examination  Filed Vitals:   06/29/14 1450  BP: 134/57  Pulse: 64  Height: 5\' 6"  (1.676 m)  Weight: 153 lb (69.4 kg)  SpO2: 98%   Body mass index is 24.71 kg/(m^2).  General: WDWN female in NAD  GAIT: normal Eyes: PERRLA  Pulmonary: CTAB, Negative Rales, Negative rhonchi, & Negative wheezing.  Cardiac: regular Rhythm, + Murmur.   VASCULAR EXAM  Carotid Bruits  Left  Right    Negative  Transmitted cardiac murmur  Aorta is palpable.  Radial pulses are 1+ right, 2+ left palpable.   LE Pulses  LEFT  RIGHT   FEMORAL  palpable  palpable   POPLITEAL  not palpable  not palpable   POSTERIOR TIBIAL  not palpable  not palpable   DORSALIS PEDIS  ANTERIOR TIBIAL  1+ palpable  not palpable    Gastrointestinal: soft, nontender, BS WNL, no r/g, no palpable masses, prominent aortic pulse.  Musculoskeletal: Negative muscle atrophy/wasting. M/S 4/5 in arms, 2/5 in legs, Extremities without ischemic changes. No peripheral edema.  Neurologic: A&O X 3; Appropriate Affect, Speech is normal  CN 2-12 intact, Pain and light touch intact in extremities, Motor exam as listed above.           Non-Invasive Vascular Imaging: DATE: 06/29/2014 CEREBROVASCULAR DUPLEX EVALUATION    INDICATION: Carotid stenosis    PREVIOUS INTERVENTION(S): NA    DUPLEX EXAM:     RIGHT  LEFT  Peak Systolic Velocities (cm/s) End Diastolic Velocities (cm/s) Plaque LOCATION Peak Systolic Velocities (cm/s) End Diastolic Velocities (cm/s) Plaque  196  16  CCA PROXIMAL 160 20   91 15 HT CCA MID 112 17 HT  74 13 HT CCA DISTAL 115 15 HT  119 0 HT ECA 126 0 HT  128 23 HT ICA PROXIMAL 163 23 HT  144 22  ICA MID 101 20   154 34  ICA DISTAL 176 35     1.41 ICA / CCA Ratio (PSV) 1.46  Antegrade Vertebral Flow Antegrade  [id number] Brachial Systolic Pressure (mmHg) [id number]  Triphasic Brachial Artery Waveforms Triphasic    Plaque Morphology:  HM = Homogeneous, HT = Heterogeneous, CP = Calcific Plaque, SP = Smooth Plaque, IP = Irregular Plaque  ADDITIONAL FINDINGS:     IMPRESSION: Bilateral internal carotid artery stenosis present in the less than 40% range. Bilateral common carotid artery disease present.    Compared to the previous exam:  Essentially unchanged since previous study on 04/11/2013.    LOWER EXTREMITY ARTERIAL EVALUATION    INDICATION: Peripheral vascular disease    PREVIOUS INTERVENTION(S): Aorto-bi-femoral bypass graft 1998; Left pfa to peroneal bypass graft w/multiple distal anastomotic revisions latest on 03/17/2011.    DUPLEX EXAM: Duplex evaluation of the lower extremity arterial system to include the common femoral, superficial femoral, popliteal, and tibial arteries and bypass graft(s) and/or stent(s) if present.      FINDINGS:                                       RIGHT LOWER EXTREMITY:  Heterogeneous plaque present throughout the right lower extremity arterial system with hemodynamically significant stenosis present.    LEFT LOWER EXTREMITY:  See impression.       See the attached diagram for velocities.  IMPRESSION:  Hemodynamically significant stenosis present involving  the right mid/distal thigh segment of the superficial femoral artery and proximal profunda femoral artery, suggestive of greater than 50% stenosis. Left profunda femoral to peroneal artery bypass is patent without hemodynamically significant changes present. No color or spectral Doppler waveform could be obtained involving the left posterior tibial artery suggestive of vessel occlusion. Abnormal retrograde flow present involving the left anterior tibial artery.    ANKLE/BRACHIAL INDEX - Right =  0.65                Left =   1.01         (Please see complete report)                 ASSESSMENT: Jackie Alvarez is a 81 y.o. female who presents with known carotid stenosis as well as a past vascular history of multiple surgeries including an aortobifem bypass graft in 1998 and 2001 a left profunda femoris to peroneal bypass graft using lesser saphenous vein with multiple revisions of the distal anastomosis.  She no longer has lower extremity claudication since she lost 32 pounds after a long bout of pneumonia.  She has no history of stroke or TIA. Today's carotid Duplex suggests minimal bilateral ICA stenosis, essentially unchanged since previous study on 04/11/2013.  Today's bilateral LE arterial Duplex suggests hemodynamically significant stenosis present involving the right mid/distal thigh segment of the superficial femoral artery (410 cm/s) and proximal profunda femoral artery (416 cm/s), suggestive of greater than 50% stenosis. Left profunda femoral to peroneal artery bypass is patent without hemodynamically significant changes present. No color or spectral Doppler waveform could be obtained involving the left posterior tibial artery suggestive of  vessel occlusion. Abnormal retrograde flow present involving the left anterior tibial artery.  Prominent aortic pulse since she lost 32 pounds, no history of AAA, no imaging results of aorta on file (review of records); will check AAA  Duplex in 2 [place].  Face to face time with patient was 25 minutes. Over 50% of this time was spent on counseling and coordination of care.    PLAN:  Based on the patient's vascular studies and examination, pt will return to clinic in 2 [place] for AAA Duplex and 1 year for carotid Duplex, bilateral LE arterial Duplex, and ABI's.  I discussed in depth with the patient the nature of atherosclerosis, and emphasized the importance of maximal medical management including strict control of blood pressure, blood glucose, and lipid levels, obtaining regular exercise, and continued cessation of smoking.  The patient is aware that without maximal medical management the underlying atherosclerotic disease process will progress, limiting the benefit of any interventions.  The patient was given information about PAD including signs, symptoms, treatment, what symptoms should prompt the patient to seek immediate medical care, and risk reduction measures to take.  Clemon Chambers, RN, MSN, FNP-C [facility] Phone: [phone]  Clinic MD: Scot Dock  06/29/2014 3:06 PM

## 2014-06-29 NOTE — Patient Instructions (Addendum)
Peripheral Vascular Disease Peripheral Vascular Disease (PVD), also called Peripheral Arterial Disease (PAD), is a circulation problem caused by cholesterol (atherosclerotic plaque) deposits in the arteries. PVD commonly occurs in the lower extremities (legs) but it can occur in other areas of the body, such as your arms. The cholesterol buildup in the arteries reduces blood flow which can cause pain and other serious problems. The presence of PVD can place a person at risk for Coronary Artery Disease (CAD).  CAUSES  Causes of PVD can be many. It is usually associated with more than one risk factor such as:   High Cholesterol.  Smoking.  Diabetes.  Lack of exercise or inactivity.  High blood pressure (hypertension).  Obesity.  Family history. SYMPTOMS   When the lower extremities are affected, patients with PVD may experience:  Leg pain with exertion or physical activity. This is called INTERMITTENT CLAUDICATION. This may present as cramping or numbness with physical activity. The location of the pain is associated with the level of blockage. For example, blockage at the abdominal level (distal abdominal aorta) may result in buttock or hip pain. Lower leg arterial blockage may result in calf pain.  As PVD becomes more severe, pain can develop with less physical activity.  In people with severe PVD, leg pain may occur at rest.  Other PVD signs and symptoms:  Leg numbness or weakness.  Coldness in the affected leg or foot, especially when compared to the other leg.  A change in leg color.  Patients with significant PVD are more prone to ulcers or sores on toes, feet or legs. These may take longer to heal or may reoccur. The ulcers or sores can become infected.  If signs and symptoms of PVD are ignored, gangrene may occur. This can result in the loss of toes or loss of an entire limb.  Not all leg pain is related to PVD. Other medical conditions can cause leg pain such  as:  Blood clots (embolism) or Deep Vein Thrombosis.  Inflammation of the blood vessels (vasculitis).  Spinal stenosis. DIAGNOSIS  Diagnosis of PVD can involve several different types of tests. These can include:  Pulse Volume Recording Method (PVR). This test is simple, painless and does not involve the use of X-rays. PVR involves measuring and comparing the blood pressure in the arms and legs. An ABI (Ankle-Brachial Index) is calculated. The normal ratio of blood pressures is 1. As this number becomes smaller, it indicates more severe disease.  < 0.95 - indicates significant narrowing in one or more leg vessels.  <0.8 - there will usually be pain in the foot, leg or buttock with exercise.  <0.4 - will usually have pain in the legs at rest.  <0.25 - usually indicates limb threatening PVD.  Doppler detection of pulses in the legs. This test is painless and checks to see if you have a pulses in your legs/feet.  A dye or contrast material (a substance that highlights the blood vessels so they show up on x-ray) may be given to help your caregiver better see the arteries for the following tests. The dye is eliminated from your body by the kidney's. Your caregiver may order blood work to check your kidney function and other laboratory values before the following tests are performed:  Magnetic Resonance Angiography (MRA). An MRA is a picture study of the blood vessels and arteries. The MRA machine uses a large magnet to produce images of the blood vessels.  Computed Tomography Angiography (CTA). A CTA   is a specialized x-ray that looks at how the blood flows in your blood vessels. An IV may be inserted into your arm so contrast dye can be injected.  Angiogram. Is a procedure that uses x-rays to look at your blood vessels. This procedure is minimally invasive, meaning a small incision (cut) is made in your groin. A small tube (catheter) is then inserted into the artery of your groin. The catheter  is guided to the blood vessel or artery your caregiver wants to examine. Contrast dye is injected into the catheter. X-rays are then taken of the blood vessel or artery. After the images are obtained, the catheter is taken out. TREATMENT  Treatment of PVD involves many interventions which may include:  Lifestyle changes:  Quitting smoking.  Exercise.  Following a low fat, low cholesterol diet.  Control of diabetes.  Foot care is very important to the PVD patient. Good foot care can help prevent infection.  Medication:  Cholesterol-lowering medicine.  Blood pressure medicine.  Anti-platelet drugs.  Certain medicines may reduce symptoms of Intermittent Claudication.  Interventional/Surgical options:  Angioplasty. An Angioplasty is a procedure that inflates a balloon in the blocked artery. This opens the blocked artery to improve blood flow.  Stent Implant. A wire mesh tube (stent) is placed in the artery. The stent expands and stays in place, allowing the artery to remain open.  Peripheral Bypass Surgery. This is a surgical procedure that reroutes the blood around a blocked artery to help improve blood flow. This type of procedure may be performed if Angioplasty or stent implants are not an option. SEEK IMMEDIATE MEDICAL CARE IF:   You develop pain or numbness in your arms or legs.  Your arm or leg turns cold, becomes blue in color.  You develop redness, warmth, swelling and pain in your arms or legs. MAKE SURE YOU:   Understand these instructions.  Will watch your condition.  Will get help right away if you are not doing well or get worse. Document Released: 04/02/2004 Document Revised: 05/18/2011 Document Reviewed: 02/28/2008 Exit[employer] Patient Information 2015 [employer]. This information is not intended to replace advice given to you by your health care provider. Make sure you discuss any questions you have with your health care provider.   Stroke  Prevention Some medical conditions and behaviors are associated with an increased chance of having a stroke. You may prevent a stroke by making healthy choices and managing medical conditions. HOW CAN I REDUCE MY RISK OF HAVING A STROKE?   Stay physically active. Get at least 30 minutes of activity on most or all days.  Do not smoke. It may also be helpful to avoid exposure to secondhand smoke.  Limit alcohol use. Moderate alcohol use is considered to be:  No more than 2 drinks per day for men.  No more than 1 drink per day for nonpregnant women.  Eat healthy foods. This involves:  Eating 5 or more servings of fruits and vegetables a day.  Making dietary changes that address high blood pressure (hypertension), high cholesterol, diabetes, or obesity.  Manage your cholesterol levels.  Making food choices that are high in fiber and low in saturated fat, trans fat, and cholesterol may control cholesterol levels.  Take any prescribed medicines to control cholesterol as directed by your health care provider.  Manage your diabetes.  Controlling your carbohydrate and sugar intake is recommended to manage diabetes.  Take any prescribed medicines to control diabetes as directed by your health care provider.    Control your hypertension.  Making food choices that are low in salt (sodium), saturated fat, trans fat, and cholesterol is recommended to manage hypertension.  Take any prescribed medicines to control hypertension as directed by your health care provider.  Maintain a healthy weight.  Reducing calorie intake and making food choices that are low in sodium, saturated fat, trans fat, and cholesterol are recommended to manage weight.  Stop drug abuse.  Avoid taking birth control pills.  Talk to your health care provider about the risks of taking birth control pills if you are over 35 years old, smoke, get migraines, or have ever had a blood clot.  Get evaluated for sleep  disorders (sleep apnea).  Talk to your health care provider about getting a sleep evaluation if you snore a lot or have excessive sleepiness.  Take medicines only as directed by your health care provider.  For some people, aspirin or blood thinners (anticoagulants) are helpful in reducing the risk of forming abnormal blood clots that can lead to stroke. If you have the irregular heart rhythm of atrial fibrillation, you should be on a blood thinner unless there is a good reason you cannot take them.  Understand all your medicine instructions.  Make sure that other conditions (such as anemia or atherosclerosis) are addressed. SEEK IMMEDIATE MEDICAL CARE IF:   You have sudden weakness or numbness of the face, arm, or leg, especially on one side of the body.  Your face or eyelid droops to one side.  You have sudden confusion.  You have trouble speaking (aphasia) or understanding.  You have sudden trouble seeing in one or both eyes.  You have sudden trouble walking.  You have dizziness.  You have a loss of balance or coordination.  You have a sudden, severe headache with no known cause.  You have new chest pain or an irregular heartbeat. Any of these symptoms may represent a serious problem that is an emergency. Do not wait to see if the symptoms will go away. Get medical help at once. Call your local emergency services (911 in [place] Do not drive yourself to the hospital. Document Released: 04/02/2004 Document Revised: 07/10/2013 Document Reviewed: 08/26/2012 [employer] Patient Information 2015 [employer]. This information is not intended to replace advice given to you by your health care provider. Make sure you discuss any questions you have with your health care provider.  

## 2014-07-03 NOTE — Addendum Note (Signed)
Addended by: Mena Goes on: 07/03/2014 03:18 PM   Modules accepted: Orders

## 2014-07-05 ENCOUNTER — Telehealth: Payer: Self-pay | Admitting: Family

## 2014-07-05 ENCOUNTER — Telehealth (HOSPITAL_COMMUNITY): Payer: Self-pay | Admitting: Family

## 2014-07-05 ENCOUNTER — Other Ambulatory Visit: Payer: Self-pay | Admitting: *Deleted

## 2014-07-05 DIAGNOSIS — I739 Peripheral vascular disease, unspecified: Secondary | ICD-10-CM

## 2014-07-05 NOTE — Telephone Encounter (Signed)
-----   Message from Viann Fish, NP sent at 07/04/2014 10:15 PM EDT ----- Regarding: FW: follow up in 6 or 12 months? arteriogram? Please add bilateral LE arterial Duplex on pt's return in a year, indication is PAD, bilateral LE arterial bypass. Thank you, Vinnie Level  ----- Message -----    From: Mal Misty, MD    Sent: 07/04/2014   8:25 PM      To: Sharmon Leyden Nickel, NP Subject: RE: follow up in 6 or 12 months? arteriogram?  1 year OK [facility] ----- Message -----    From: Viann Fish, NP    Sent: 06/29/2014   9:10 PM      To: Mal Misty, MD, Sharmon Leyden Nickel, NP Subject: follow up in 6 or 12 months? arteriogram?      Dr. Kellie Simmering, Pt has no more claudication since she lost 32 pounds after a prolonged bout of severe pneumonia. She has no tissue loss in her lower extremities. But her right profunda and SFA native arteries have velocities in the 400's whereas eight months ago these velocities were 519 and 382 with claudication. Have her return as planned in a year for ABI's? I will add bilateral LE arterial Duplex if she does not need a CTA with run off or an arteriogram. Is a year return OK for the PAD studies or should it be 6 months? She has some degree of CRF, sees Dr. Mercy Moore for this.  Thank you, Vinnie Level     ASSESSMENT: Jackie Alvarez is a 81 y.o. female who presents with known carotid stenosis as well as a past vascular history of multiple surgeries including an aortobifem bypass graft in 1998 and 2001 a left profunda femoris to peroneal bypass graft using lesser saphenous vein with multiple revisions of the distal anastomosis.  She no longer has lower extremity claudication since she lost 32 pounds after a long bout of pneumonia.  She has no history of stroke or TIA. Today's carotid Duplex suggests minimal bilateral ICA stenosis, essentially unchanged since previous study on 04/11/2013.  Today's bilateral LE arterial Duplex suggests hemodynamically significant  stenosis present involving the right mid/distal thigh segment of the superficial femoral artery (410 cm/s) and proximal profunda femoral artery (416 cm/s), suggestive of greater than 50% stenosis. Left profunda femoral to peroneal artery bypass is patent without hemodynamically significant changes present. No color or spectral Doppler waveform could be obtained involving the left posterior tibial artery suggestive of vessel occlusion. Abnormal retrograde flow present involving the left anterior tibial artery.  Prominent aortic pulse since she lost 32 pounds, no history of AAA, no imaging results of aorta on file (review of records); will check AAA Duplex in 2 [place].  Face to face time with patient was 25 minutes. Over 50% of this time was spent on counseling and coordination of care.    PLAN:  Based on the patient's vascular studies and examination, pt will return to clinic in 2 [place] for AAA Duplex and 1 year for carotid Duplex and ABI's.

## 2014-07-05 NOTE — Telephone Encounter (Signed)
-----   Message from Viann Fish, NP sent at 07/04/2014 10:15 PM EDT ----- Regarding: FW: follow up in 6 or 12 months? arteriogram? Please add bilateral LE arterial Duplex on pt's return in a year, indication is PAD, bilateral LE arterial bypass. Thank you, Jackie Alvarez  ----- Message -----    From: Mal Misty, MD    Sent: 07/04/2014   8:25 PM      To: Sharmon Leyden Nickel, NP Subject: RE: follow up in 6 or 12 months? arteriogram?  1 year OK [facility] ----- Message -----    From: Viann Fish, NP    Sent: 06/29/2014   9:10 PM      To: Mal Misty, MD, Sharmon Leyden Nickel, NP Subject: follow up in 6 or 12 months? arteriogram?      Dr. Kellie Simmering, Pt has no more claudication since she lost 32 pounds after a prolonged bout of severe pneumonia. She has no tissue loss in her lower extremities. But her right profunda and SFA native arteries have velocities in the 400's whereas eight months ago these velocities were 519 and 382 with claudication. Have her return as planned in a year for ABI's? I will add bilateral LE arterial Duplex if she does not need a CTA with run off or an arteriogram. Is a year return OK for the PAD studies or should it be 6 months? She has some degree of CRF, sees Dr. Mercy Moore for this.  Thank you, Jackie Alvarez     ASSESSMENT: Jackie Alvarez is a 81 y.o. female who presents with known carotid stenosis as well as a past vascular history of multiple surgeries including an aortobifem bypass graft in 1998 and 2001 a left profunda femoris to peroneal bypass graft using lesser saphenous vein with multiple revisions of the distal anastomosis.  She no longer has lower extremity claudication since she lost 32 pounds after a long bout of pneumonia.  She has no history of stroke or TIA. Today's carotid Duplex suggests minimal bilateral ICA stenosis, essentially unchanged since previous study on 04/11/2013.  Today's bilateral LE arterial Duplex suggests hemodynamically significant  stenosis present involving the right mid/distal thigh segment of the superficial femoral artery (410 cm/s) and proximal profunda femoral artery (416 cm/s), suggestive of greater than 50% stenosis. Left profunda femoral to peroneal artery bypass is patent without hemodynamically significant changes present. No color or spectral Doppler waveform could be obtained involving the left posterior tibial artery suggestive of vessel occlusion. Abnormal retrograde flow present involving the left anterior tibial artery.  Prominent aortic pulse since she lost 32 pounds, no history of AAA, no imaging results of aorta on file (review of records); will check AAA Duplex in 2 [place].  Face to face time with patient was 25 minutes. Over 50% of this time was spent on counseling and coordination of care.    PLAN:  Based on the patient's vascular studies and examination, pt will return to clinic in 2 [place] for AAA Duplex and 1 year for carotid Duplex and ABI's.

## 2014-07-05 NOTE — Telephone Encounter (Signed)
Appointment scheduled.

## 2014-07-09 ENCOUNTER — Ambulatory Visit (INDEPENDENT_AMBULATORY_CARE_PROVIDER_SITE_OTHER): Payer: Medicare Other | Admitting: Podiatry

## 2014-07-09 ENCOUNTER — Encounter: Payer: Self-pay | Admitting: Podiatry

## 2014-07-09 VITALS — BP 161/66 | HR 65 | Resp 12

## 2014-07-09 DIAGNOSIS — M79676 Pain in unspecified toe(s): Secondary | ICD-10-CM

## 2014-07-09 DIAGNOSIS — B351 Tinea unguium: Secondary | ICD-10-CM | POA: Diagnosis not present

## 2014-07-09 NOTE — Patient Instructions (Signed)
Diabetes and Foot Care Diabetes may cause you to have problems because of poor blood supply (circulation) to your feet and legs. This may cause the skin on your feet to become thinner, break easier, and heal more slowly. Your skin may become dry, and the skin may peel and crack. You may also have nerve damage in your legs and feet causing decreased feeling in them. You may not notice minor injuries to your feet that could lead to infections or more serious problems. Taking care of your feet is one of the most important things you can do for yourself.  HOME CARE INSTRUCTIONS  Wear shoes at all times, even in the house. Do not go barefoot. Bare feet are easily injured.  Check your feet daily for blisters, cuts, and redness. If you cannot see the bottom of your feet, use a mirror or ask someone for help.  Wash your feet with warm water (do not use hot water) and mild soap. Then pat your feet and the areas between your toes until they are completely dry. Do not soak your feet as this can dry your skin.  Apply a moisturizing lotion or petroleum jelly (that does not contain alcohol and is unscented) to the skin on your feet and to dry, brittle toenails. Do not apply lotion between your toes.  Trim your toenails straight across. Do not dig under them or around the cuticle. File the edges of your nails with an emery board or nail file.  Do not cut corns or calluses or try to remove them with medicine.  Wear clean socks or stockings every day. Make sure they are not too tight. Do not wear knee-high stockings since they may decrease blood flow to your legs.  Wear shoes that fit properly and have enough cushioning. To break in new shoes, wear them for just a few hours a day. This prevents you from injuring your feet. Always look in your shoes before you put them on to be sure there are no objects inside.  Do not cross your legs. This may decrease the blood flow to your feet.  If you find a minor scrape,  cut, or break in the skin on your feet, keep it and the skin around it clean and dry. These areas may be cleansed with mild soap and water. Do not cleanse the area with peroxide, alcohol, or iodine.  When you remove an adhesive bandage, be sure not to damage the skin around it.  If you have a wound, look at it several times a day to make sure it is healing.  Do not use heating pads or hot water bottles. They may burn your skin. If you have lost feeling in your feet or legs, you may not know it is happening until it is too late.  Make sure your health care provider performs a complete foot exam at least annually or more often if you have foot problems. Report any cuts, sores, or bruises to your health care provider immediately. SEEK MEDICAL CARE IF:   You have an injury that is not healing.  You have cuts or breaks in the skin.  You have an ingrown nail.  You notice redness on your legs or feet.  You feel burning or tingling in your legs or feet.  You have pain or cramps in your legs and feet.  Your legs or feet are numb.  Your feet always feel cold. SEEK IMMEDIATE MEDICAL CARE IF:   There is increasing redness,   swelling, or pain in or around a wound.  There is a red line that goes up your leg.  Pus is coming from a wound.  You develop a fever or as directed by your health care provider.  You notice a bad smell coming from an ulcer or wound. Document Released: 02/21/2000 Document Revised: 10/26/2012 Document Reviewed: 08/02/2012 Exit[employer] Patient Information 2015 [employer]. This information is not intended to replace advice given to you by your health care provider. Make sure you discuss any questions you have with your health care provider.  

## 2014-07-10 NOTE — Progress Notes (Signed)
Patient ID: Jackie Alvarez, female   DOB: August 24, 1931, 81 y.o.   MRN: [id number]  Subjective: Patient presents today requesting nail debridement  Objective: Amputated second left toe The remaining toenails are elongated, brittle, upper trophic and tender to palpation Small eschar medial second right toe Well-healed surgical scar left lower leg  Assessment: History of peripheral arterial disease History of diabetic neuropathy Symptomatic onychomycoses 9  Plan: Debridement of toenails 9 without a bleeding  Reappoint 3 months

## 2014-07-23 DIAGNOSIS — Z7901 Long term (current) use of anticoagulants: Secondary | ICD-10-CM | POA: Diagnosis not present

## 2014-07-24 ENCOUNTER — Encounter: Payer: Self-pay | Admitting: Family

## 2014-07-26 ENCOUNTER — Encounter: Payer: Self-pay | Admitting: Family

## 2014-07-26 ENCOUNTER — Ambulatory Visit (HOSPITAL_COMMUNITY)
Admission: RE | Admit: 2014-07-26 | Discharge: 2014-07-26 | Disposition: A | Discharge status: Home / Self Care | Payer: Medicare Other | Source: Ambulatory Visit | Attending: Family | Admitting: Family

## 2014-07-26 ENCOUNTER — Ambulatory Visit (INDEPENDENT_AMBULATORY_CARE_PROVIDER_SITE_OTHER): Payer: Medicare Other | Admitting: Family

## 2014-07-26 VITALS — BP 139/59 | HR 70 | Resp 16 | Ht 66.0 in | Wt 149.0 lb

## 2014-07-26 DIAGNOSIS — R0989 Other specified symptoms and signs involving the circulatory and respiratory systems: Secondary | ICD-10-CM | POA: Diagnosis not present

## 2014-07-26 DIAGNOSIS — Z87891 Personal history of nicotine dependence: Secondary | ICD-10-CM

## 2014-07-26 DIAGNOSIS — Z95828 Presence of other vascular implants and grafts: Secondary | ICD-10-CM

## 2014-07-26 DIAGNOSIS — Z9889 Other specified postprocedural states: Secondary | ICD-10-CM | POA: Diagnosis not present

## 2014-07-26 DIAGNOSIS — I6523 Occlusion and stenosis of bilateral carotid arteries: Secondary | ICD-10-CM

## 2014-07-26 DIAGNOSIS — I739 Peripheral vascular disease, unspecified: Secondary | ICD-10-CM

## 2014-07-26 NOTE — Patient Instructions (Signed)

## 2014-07-26 NOTE — Progress Notes (Signed)
[facility] [place] HISTORY AND PHYSICAL   MRN : [id number]  History of Present Illness:   Jackie Alvarez is a 81 y.o. female patient of Dr. Kellie Simmering followed for known carotid stenosis as well as a past vascular history of multiple surgeries including an aortobifem bypass graft in 1998 and 2001 a left profunda femoris to peroneal bypass graft using lesser saphenous vein with multiple revisions of the distal anastomosis.  She returns today for Duplex evaluation of prominent abdominal aortic pulse noted at her visit last month since she lost over 30 pounds.  Patient has Negative history of TIA or stroke symptom. The patient denies amaurosis fugax or monocular blindness. The patient denies facial drooping.  Pt. denies hemiplegia. The patient denies receptive or expressive aphasia.  She had pneumonia October 2015, was treated as an out patient, lost her appetite and lost 32 pounds; since this weight loss she has had no more claudication. She denies post prandial abdominal pain, denies any abdominal or back pain.   Pt denies steal symptoms in either arm.   She sees Dr. Mercy Moore for CRF,  added carvedilol.   Pt Diabetic: Yes, pt states in good control  Pt smoker: former smoker, quit in 1998   Pt meds include:  Statin : Yes  ASA: No  Other anticoagulants/antiplatelets: coumadin, possibly for atrial flutter, was started Oct., 2014, per pt., her heart rate was in the 30's, does not have a pacemaker.      Current Outpatient Prescriptions  Medication Sig Dispense Refill  . amLODipine (NORVASC) 10 MG tablet Take 10 mg by mouth daily.      . Ferrous Sulfate (IRON) 325 (65 FE) MG TABS Take 65 mg by mouth 2 (two) times daily.     . fluticasone (FLONASE) 50 MCG/ACT nasal spray Place 2 sprays into both nostrils daily.    . furosemide (LASIX) 40 MG tablet Take 40 mg by mouth daily. Take 2 tablet in AM and take 1 tablet in the PM    . hydrALAZINE (APRESOLINE) 100 MG  tablet Take 100 mg by mouth 3 (three) times daily.    Marland Kitchen levothyroxine (SYNTHROID, LEVOTHROID) 88 MCG tablet Take 88 mcg by mouth daily before breakfast.    . mirtazapine (REMERON) 30 MG tablet Take 30 mg by mouth every morning.    . pioglitazone (ACTOS) 30 MG tablet Take 30 mg by mouth daily.    . potassium chloride SA (K-DUR,KLOR-CON) 20 MEQ tablet Take 20 mEq by mouth daily.    . simvastatin (ZOCOR) 20 MG tablet Take 20 mg by mouth at bedtime.      . sitaGLIPtin (JANUVIA) 100 MG tablet Take 50 mg by mouth daily.     Marland Kitchen warfarin (COUMADIN) 1 MG tablet Take 1 mg by mouth as directed. Take 2 mg on Sunday, Monday, Wednesday and Friday. 1.5 mg on Tuesday and Thursday     No current facility-administered medications for this visit.    Past Medical History  Diagnosis Date  . Diabetes mellitus   . Hypertension   . Hyperlipidemia   . Ulcer 2009    gatric with history of bleeding - resolved  . COPD (chronic obstructive pulmonary disease)   . Cancer 1994    Left Breast  . Carotid artery occlusion     mild bilateral  . Anxiety   . Central retinal vein occlusion, right eye   . Anemia   . Chronic kidney disease     CKD stage III  .  Atrial flutter 2008    remote  . Thyroid disease     toxic multinodular goiter with history of thyrotoxicosis now s/p RAI ablative therapy - now hypothyroid  . Depression   . Peripheral neuropathy   . GERD (gastroesophageal reflux disease)   . Obesity   . Hiatal hernia   . Schatzki's ring   . Cholelithiases   . Chronic diastolic CHF (congestive heart failure)   . Pulmonary HTN     mild PASP 69mmHg by echo 12/2012    Social History History  Substance Use Topics  . Smoking status: Former Smoker -- 1.00 packs/day for 45 years    Types: Cigarettes    Quit date: 01/26/1997  . Smokeless tobacco: Never Used  . Alcohol Use: No    Family History Family History  Problem Relation Age of Onset  . Cancer Mother     stomach  . Cancer Father     liver  .  Cirrhosis Father   . Cancer Sister   . Diabetes Sister     Surgical History Past Surgical History  Procedure Laterality Date  . Wrist surgery  02/2009  . Cholecystectomy      Gall Bladder  . Pr vein bypass graft,aorto-fem-pop    . Abdominal hysterectomy      with BSO  . Breast lumpectomy    . Abdominal aortagram  Jan. 18, 2013  . Hip surgery Left   . Abdominal aortagram N/A 03/17/2011    Procedure: ABDOMINAL Maxcine Ham;  Surgeon: Serafina Mitchell, MD;  Location[facility] CATH LAB;  Service: Cardiovascular;  Laterality: N/A;    Allergies  Allergen Reactions  . Metformin And Related Other (See Comments)    Renal insuff  . Omnipaque [Iohexol] Nausea And Vomiting    unknown    Current Outpatient Prescriptions  Medication Sig Dispense Refill  . amLODipine (NORVASC) 10 MG tablet Take 10 mg by mouth daily.      . Ferrous Sulfate (IRON) 325 (65 FE) MG TABS Take 65 mg by mouth 2 (two) times daily.     . fluticasone (FLONASE) 50 MCG/ACT nasal spray Place 2 sprays into both nostrils daily.    . furosemide (LASIX) 40 MG tablet Take 40 mg by mouth daily. Take 2 tablet in AM and take 1 tablet in the PM    . hydrALAZINE (APRESOLINE) 100 MG tablet Take 100 mg by mouth 3 (three) times daily.    Marland Kitchen levothyroxine (SYNTHROID, LEVOTHROID) 88 MCG tablet Take 88 mcg by mouth daily before breakfast.    . mirtazapine (REMERON) 30 MG tablet Take 30 mg by mouth every morning.    . pioglitazone (ACTOS) 30 MG tablet Take 30 mg by mouth daily.    . potassium chloride SA (K-DUR,KLOR-CON) 20 MEQ tablet Take 20 mEq by mouth daily.    . simvastatin (ZOCOR) 20 MG tablet Take 20 mg by mouth at bedtime.      . sitaGLIPtin (JANUVIA) 100 MG tablet Take 50 mg by mouth daily.     Marland Kitchen warfarin (COUMADIN) 1 MG tablet Take 1 mg by mouth as directed. Take 2 mg on Sunday, Monday, Wednesday and Friday. 1.5 mg on Tuesday and Thursday     No current facility-administered medications for this visit.     REVIEW OF SYSTEMS: See HPI  for pertinent positives and negatives.  Physical Examination  Filed Vitals:   07/26/14 0913  BP: 139/59  Pulse: 70  Resp: 16  Height: 5\' 6"  (1.676 m)  Weight: 149  lb (67.586 kg)  SpO2: 99%   Body mass index is 24.06 kg/(m^2).   General: WDWN female in NAD  GAIT: normal Eyes: PERRLA  Pulmonary: CTAB, Negative Rales, Negative rhonchi, & Negative wheezing.  Cardiac: regular Rhythm, + Murmur.   VASCULAR EXAM  Carotid Bruits  Left  Right    Negative  Transmitted cardiac murmur  Aorta is palpable.  Radial pulses are 1+ right, 2+ left palpable.   LE Pulses  LEFT  RIGHT   FEMORAL  palpable  palpable   POPLITEAL  not palpable  not palpable   POSTERIOR TIBIAL  not palpable  not palpable   DORSALIS PEDIS  ANTERIOR TIBIAL  1+ palpable  not palpable    Gastrointestinal: soft, nontender, BS WNL, no r/g, no palpable masses, prominent aortic pulse.  Musculoskeletal: Negative muscle atrophy/wasting. M/S 4/5 in arms, 2/5 in legs, Extremities without ischemic changes. No peripheral edema.  Neurologic: A&O X 3; Appropriate Affect, Speech is normal  CN 2-12 intact, Pain and light touch intact in extremities, Motor exam as listed above.               Non-Invasive Vascular Imaging (07/26/2014):  ABDOMINAL AORTA DUPLEX EVALUATION    INDICATION: Prominent abdominal aorta pulse.    PREVIOUS INTERVENTION(S):     DUPLEX EXAM:     LOCATION DIAMETER AP (cm) DIAMETER TRANSVERSE (cm) VELOCITIES (cm/sec)  Aorta Proximal 1.67 1.63 95  Aorta Mid 1.95 1.87 79  Aorta Distal 2.00 2.04 58  Right Common Iliac Artery 0.93 0.85 140  Left Common Iliac Artery 0.89 1.07 116    Previous max aortic diameter:   Date:      ADDITIONAL FINDINGS:     IMPRESSION: No evidence of abdominal aortic aneurysm.    Compared to the previous exam:  No prior exam performed at this facility for comparison.      ASSESSMENT:  ANIYIA RHODD is a 81  y.o. female who presents with known carotid stenosis as well as a past vascular history of multiple surgeries including an aortobifem bypass graft in 1998 and 2001 a left profunda femoris to peroneal bypass graft using lesser saphenous vein with multiple revisions of the distal anastomosis.  She no longer has lower extremity claudication since she lost 32 pounds after a long bout of pneumonia.  She has no history of stroke or TIA. April 2016 carotid Duplex suggests minimal bilateral ICA stenosis, essentially unchanged since previous study on 04/11/2013.  April 2016 bilateral LE arterial Duplex suggests hemodynamically significant stenosis present involving the right mid/distal thigh segment of the superficial femoral artery (410 cm/s) and proximal profunda femoral artery (416 cm/s), suggestive of greater than 50% stenosis. Left profunda femoral to peroneal artery bypass is patent without hemodynamically significant changes present. No color or spectral Doppler waveform could be obtained involving the left posterior tibial artery suggestive of vessel occlusion. Abnormal retrograde flow present involving the left anterior tibial artery.  Prominent aortic pulse since she lost 32 pounds, no history of AAA, no imaging results of aorta on file (review of records); today's abdominal aorta Duplex suggests no evidence of abdominal aortic aneurysm.   PLAN:   Based on today's exam and non-invasive vascular lab results, the patient will follow up as already scheduled in 1 year for carotid Duplex, bilateral LE arterial Duplex, and ABI's. I discussed in depth with the patient the nature of atherosclerosis, and emphasized the importance of maximal medical management including strict control of blood pressure, blood glucose, and lipid levels,  obtaining regular exercise, and cessation of smoking.  The patient is aware that without maximal medical management the underlying atherosclerotic disease process will  progress, limiting the benefit of any interventions. . The patient was given information about stroke prevention and what symptoms should prompt the patient to seek immediate medical care.  The patient was given information about PAD including signs, symptoms, treatment, what symptoms should prompt the patient to seek immediate medical care, and risk reduction measures to take. Thank you for allowing [place] to participate in this patient's care.  Clemon Chambers, RN, MSN, FNP-C [facility] Office: [phone]  Clinic MD: [facility]  07/26/2014 9:15 AM

## 2014-08-01 DIAGNOSIS — H9313 Tinnitus, bilateral: Secondary | ICD-10-CM | POA: Diagnosis not present

## 2014-08-01 DIAGNOSIS — H6123 Impacted cerumen, bilateral: Secondary | ICD-10-CM | POA: Diagnosis not present

## 2014-08-01 DIAGNOSIS — H903 Sensorineural hearing loss, bilateral: Secondary | ICD-10-CM | POA: Diagnosis not present

## 2014-08-16 DIAGNOSIS — Z1231 Encounter for screening mammogram for malignant neoplasm of breast: Secondary | ICD-10-CM | POA: Diagnosis not present

## 2014-08-16 DIAGNOSIS — Z853 Personal history of malignant neoplasm of breast: Secondary | ICD-10-CM | POA: Diagnosis not present

## 2014-08-23 DIAGNOSIS — E785 Hyperlipidemia, unspecified: Secondary | ICD-10-CM | POA: Diagnosis not present

## 2014-08-23 DIAGNOSIS — Z7901 Long term (current) use of anticoagulants: Secondary | ICD-10-CM | POA: Diagnosis not present

## 2014-08-23 DIAGNOSIS — E1140 Type 2 diabetes mellitus with diabetic neuropathy, unspecified: Secondary | ICD-10-CM | POA: Diagnosis not present

## 2014-08-27 DIAGNOSIS — L309 Dermatitis, unspecified: Secondary | ICD-10-CM | POA: Diagnosis not present

## 2014-08-30 DIAGNOSIS — N183 Chronic kidney disease, stage 3 (moderate): Secondary | ICD-10-CM | POA: Diagnosis not present

## 2014-08-30 DIAGNOSIS — I129 Hypertensive chronic kidney disease with stage 1 through stage 4 chronic kidney disease, or unspecified chronic kidney disease: Secondary | ICD-10-CM | POA: Diagnosis not present

## 2014-08-30 DIAGNOSIS — E1129 Type 2 diabetes mellitus with other diabetic kidney complication: Secondary | ICD-10-CM | POA: Diagnosis not present

## 2014-08-30 DIAGNOSIS — D631 Anemia in chronic kidney disease: Secondary | ICD-10-CM | POA: Diagnosis not present

## 2014-09-04 DIAGNOSIS — Z7901 Long term (current) use of anticoagulants: Secondary | ICD-10-CM | POA: Diagnosis not present

## 2014-09-19 DIAGNOSIS — F419 Anxiety disorder, unspecified: Secondary | ICD-10-CM | POA: Diagnosis not present

## 2014-09-19 DIAGNOSIS — Z7901 Long term (current) use of anticoagulants: Secondary | ICD-10-CM | POA: Diagnosis not present

## 2014-09-19 DIAGNOSIS — I10 Essential (primary) hypertension: Secondary | ICD-10-CM | POA: Diagnosis not present

## 2014-09-19 DIAGNOSIS — N183 Chronic kidney disease, stage 3 (moderate): Secondary | ICD-10-CM | POA: Diagnosis not present

## 2014-09-19 DIAGNOSIS — E1140 Type 2 diabetes mellitus with diabetic neuropathy, unspecified: Secondary | ICD-10-CM | POA: Diagnosis not present

## 2014-10-10 ENCOUNTER — Encounter: Payer: Self-pay | Admitting: Podiatry

## 2014-10-10 ENCOUNTER — Ambulatory Visit (INDEPENDENT_AMBULATORY_CARE_PROVIDER_SITE_OTHER): Payer: Medicare Other | Admitting: Podiatry

## 2014-10-10 DIAGNOSIS — B351 Tinea unguium: Secondary | ICD-10-CM | POA: Diagnosis not present

## 2014-10-10 DIAGNOSIS — M79676 Pain in unspecified toe(s): Secondary | ICD-10-CM

## 2014-10-10 DIAGNOSIS — E1140 Type 2 diabetes mellitus with diabetic neuropathy, unspecified: Secondary | ICD-10-CM

## 2014-10-10 NOTE — Patient Instructions (Signed)
Diabetes and Foot Care Diabetes may cause you to have problems because of poor blood supply (circulation) to your feet and legs. This may cause the skin on your feet to become thinner, break easier, and heal more slowly. Your skin may become dry, and the skin may peel and crack. You may also have nerve damage in your legs and feet causing decreased feeling in them. You may not notice minor injuries to your feet that could lead to infections or more serious problems. Taking care of your feet is one of the most important things you can do for yourself.  HOME CARE INSTRUCTIONS  Wear shoes at all times, even in the house. Do not go barefoot. Bare feet are easily injured.  Check your feet daily for blisters, cuts, and redness. If you cannot see the bottom of your feet, use a mirror or ask someone for help.  Wash your feet with warm water (do not use hot water) and mild soap. Then pat your feet and the areas between your toes until they are completely dry. Do not soak your feet as this can dry your skin.  Apply a moisturizing lotion or petroleum jelly (that does not contain alcohol and is unscented) to the skin on your feet and to dry, brittle toenails. Do not apply lotion between your toes.  Trim your toenails straight across. Do not dig under them or around the cuticle. File the edges of your nails with an emery board or nail file.  Do not cut corns or calluses or try to remove them with medicine.  Wear clean socks or stockings every day. Make sure they are not too tight. Do not wear knee-high stockings since they may decrease blood flow to your legs.  Wear shoes that fit properly and have enough cushioning. To break in new shoes, wear them for just a few hours a day. This prevents you from injuring your feet. Always look in your shoes before you put them on to be sure there are no objects inside.  Do not cross your legs. This may decrease the blood flow to your feet.  If you find a minor scrape,  cut, or break in the skin on your feet, keep it and the skin around it clean and dry. These areas may be cleansed with mild soap and water. Do not cleanse the area with peroxide, alcohol, or iodine.  When you remove an adhesive bandage, be sure not to damage the skin around it.  If you have a wound, look at it several times a day to make sure it is healing.  Do not use heating pads or hot water bottles. They may burn your skin. If you have lost feeling in your feet or legs, you may not know it is happening until it is too late.  Make sure your health care provider performs a complete foot exam at least annually or more often if you have foot problems. Report any cuts, sores, or bruises to your health care provider immediately. SEEK MEDICAL CARE IF:   You have an injury that is not healing.  You have cuts or breaks in the skin.  You have an ingrown nail.  You notice redness on your legs or feet.  You feel burning or tingling in your legs or feet.  You have pain or cramps in your legs and feet.  Your legs or feet are numb.  Your feet always feel cold. SEEK IMMEDIATE MEDICAL CARE IF:   There is increasing redness,   swelling, or pain in or around a wound.  There is a red line that goes up your leg.  Pus is coming from a wound.  You develop a fever or as directed by your health care provider.  You notice a bad smell coming from an ulcer or wound. Document Released: 02/21/2000 Document Revised: 10/26/2012 Document Reviewed: 08/02/2012 Exit[employer] Patient Information 2015 [employer]. This information is not intended to replace advice given to you by your health care provider. Make sure you discuss any questions you have with your health care provider.  

## 2014-10-11 ENCOUNTER — Encounter: Payer: Self-pay | Admitting: Cardiology

## 2014-10-11 NOTE — Progress Notes (Signed)
Patient ID: Jackie Alvarez, female   DOB: [birth year], 81 y.o.   MRN: [id number]  Subjective: This patient presents for a scheduled visit complaining of painful toenails and requesting nail debridement  Objective: Amputated second left toe The toenails are elongated, hypertrophic, brittle, discolored and tender to direct palpation 6-9  Assessment: Diabetic with a history of neuropathy and peripheral arterial disease Symptomatic onychomycoses 9  Plan: Debridement of toenails 9 mechanically and electrically without any bleeding  Reappoint 3 months

## 2014-10-23 DIAGNOSIS — Z7901 Long term (current) use of anticoagulants: Secondary | ICD-10-CM | POA: Diagnosis not present

## 2014-10-31 DIAGNOSIS — N63 Unspecified lump in breast: Secondary | ICD-10-CM | POA: Diagnosis not present

## 2014-10-31 DIAGNOSIS — Z853 Personal history of malignant neoplasm of breast: Secondary | ICD-10-CM | POA: Diagnosis not present

## 2014-11-14 DIAGNOSIS — Z7901 Long term (current) use of anticoagulants: Secondary | ICD-10-CM | POA: Diagnosis not present

## 2014-11-22 DIAGNOSIS — Z23 Encounter for immunization: Secondary | ICD-10-CM | POA: Diagnosis not present

## 2014-11-22 DIAGNOSIS — Z7901 Long term (current) use of anticoagulants: Secondary | ICD-10-CM | POA: Diagnosis not present

## 2014-12-25 ENCOUNTER — Ambulatory Visit (INDEPENDENT_AMBULATORY_CARE_PROVIDER_SITE_OTHER): Payer: Medicare Other | Admitting: Cardiology

## 2014-12-25 ENCOUNTER — Encounter: Payer: Self-pay | Admitting: Cardiology

## 2014-12-25 VITALS — BP 132/40 | HR 72 | Ht 66.0 in | Wt 144.0 lb

## 2014-12-25 DIAGNOSIS — I10 Essential (primary) hypertension: Secondary | ICD-10-CM

## 2014-12-25 DIAGNOSIS — Z7901 Long term (current) use of anticoagulants: Secondary | ICD-10-CM | POA: Diagnosis not present

## 2014-12-25 DIAGNOSIS — I480 Paroxysmal atrial fibrillation: Secondary | ICD-10-CM

## 2014-12-25 DIAGNOSIS — I5032 Chronic diastolic (congestive) heart failure: Secondary | ICD-10-CM | POA: Diagnosis not present

## 2014-12-25 DIAGNOSIS — I6523 Occlusion and stenosis of bilateral carotid arteries: Secondary | ICD-10-CM

## 2014-12-25 NOTE — Progress Notes (Signed)
Cardiology Office Note   Date:  12/25/2014   ID:  Jackie Alvarez, DOB 1931-03-30, MRN [id number]  PCP:  Jonathon Bellows, MD    Chief Complaint  Patient presents with  . Congestive Heart Failure  . Atrial Fibrillation      History of Present Illness: Jackie Alvarez is an 81 y.o. female with a history of chronic diastolic CHF, HTN, CKD stage III, remote atrial flutter and mild AS. She had an echo last fall to followup on AS and this showed mildly thickened AV with no AS. She now presents back for followup. She denies any chest pain or pressure, SOB, DOE, palpitations, LE edema, dizziness or syncope.     Past Medical History  Diagnosis Date  . Diabetes mellitus   . Hypertension   . Hyperlipidemia   . Ulcer 2009    gatric with history of bleeding - resolved  . COPD (chronic obstructive pulmonary disease) ([place])   . Cancer [facility]) 1994    Left Breast  . Carotid artery occlusion     mild bilateral  . Anxiety   . Central retinal vein occlusion, right eye   . Anemia   . Chronic kidney disease     CKD stage III  . Atrial flutter ([place]) 2008    remote  . Thyroid disease     toxic multinodular goiter with history of thyrotoxicosis now s/p RAI ablative therapy - now hypothyroid  . Depression   . Peripheral neuropathy ([place])   . GERD (gastroesophageal reflux disease)   . Obesity   . Hiatal hernia   . Schatzki's ring   . Cholelithiases   . Chronic diastolic CHF (congestive heart failure) ([place])   . Pulmonary HTN ([place])     mild PASP 32mmHg by echo 12/2012    Past Surgical History  Procedure Laterality Date  . Wrist surgery  02/2009  . Cholecystectomy      Gall Bladder  . Pr vein bypass graft,aorto-fem-pop    . Abdominal hysterectomy      with BSO  . Breast lumpectomy    . Abdominal aortagram  Jan. 18, 2013  . Hip surgery Left   . Abdominal aortagram N/A 03/17/2011    Procedure: ABDOMINAL Maxcine Ham;  Surgeon: Serafina Mitchell, MD;  Location: [facility] CATH LAB;   Service: Cardiovascular;  Laterality: N/A;     Current Outpatient Prescriptions  Medication Sig Dispense Refill  . amLODipine (NORVASC) 10 MG tablet Take 10 mg by mouth daily.      . citalopram (CELEXA) 40 MG tablet Take 40 mg by mouth daily.    . Ferrous Sulfate (IRON) 325 (65 FE) MG TABS Take 65 mg by mouth 2 (two) times daily.     . fluticasone (FLONASE) 50 MCG/ACT nasal spray Place 2 sprays into both nostrils daily.    . furosemide (LASIX) 40 MG tablet Take 40 mg by mouth daily. Take 2 tablet in AM and take 1 tablet in the PM    . hydrALAZINE (APRESOLINE) 100 MG tablet Take 100 mg by mouth 3 (three) times daily.    Marland Kitchen levothyroxine (SYNTHROID, LEVOTHROID) 88 MCG tablet Take 88 mcg by mouth daily before breakfast.    . mirtazapine (REMERON) 30 MG tablet Take 30 mg by mouth every morning.    . pioglitazone (ACTOS) 30 MG tablet Take 30 mg by mouth daily.    . potassium chloride SA (  K-DUR,KLOR-CON) 20 MEQ tablet Take 20 mEq by mouth daily.    . simvastatin (ZOCOR) 20 MG tablet Take 20 mg by mouth at bedtime.      . sitaGLIPtin (JANUVIA) 100 MG tablet Take 50 mg by mouth daily.     Marland Kitchen warfarin (COUMADIN) 1 MG tablet Take 1 mg by mouth as directed. Take 2 mg on Sunday, Monday, Wednesday and Friday. 1.5 mg on Tuesday and Thursday     No current facility-administered medications for this visit.    Allergies:   Metformin and related and Omnipaque    Social History:  The patient  reports that she quit smoking about 17 years ago. Her smoking use included Cigarettes. She has a 45 pack-year smoking history. She has never used smokeless tobacco. She reports that she does not drink alcohol or use illicit drugs.   Family History:  The patient's family history includes Cancer in her father, mother, and sister; Cirrhosis in her father; Diabetes in her sister.    ROS:  Please see the history of present illness.   Otherwise, review of systems are positive for none.   All other systems are reviewed and  negative.    PHYSICAL EXAM: VS:  BP 132/40 mmHg  Pulse 72  Ht 5\' 6"  (1.676 m)  Wt 144 lb (65.318 kg)  BMI 23.25 kg/m2  SpO2 97% , BMI Body mass index is 23.25 kg/(m^2). GEN: Well nourished, well developed, in no acute distress HEENT: normal Neck: no JVD or masses, faint right carotid bruit Cardiac: RRR; no rubs, or gallops,no edema .  1/6 SM at RUSB Respiratory:  clear to auscultation bilaterally, normal work of breathing GI: soft, nontender, nondistended, + BS MS: no deformity or atrophy Skin: warm and dry, no rash Neuro:  Strength and sensation are intact Psych: euthymic mood, full affect   EKG:  EKG is not ordered today.    Recent Labs: No results found for requested labs within last 365 days.    Lipid Panel No results found for: CHOL, TRIG, HDL, CHOLHDL, VLDL, LDLCALC, LDLDIRECT    Wt Readings from Last 3 Encounters:  12/25/14 144 lb (65.318 kg)  07/26/14 149 lb (67.586 kg)  06/29/14 153 lb (69.4 kg)     ASSESSMENT AND PLAN:  1. Chronic diastolic CHF - appears well compensated  - continue lasix. BMET followed by renal med.  2. HTN -BP controlled - continue amlodipine/Hydralazine 3. PAF - maintaining NSR  - continue Warfarin 4. Mild pulmonary HTN by echo most likely from COPD and diastolic CHF - repeat echo 12/-2015 with none 5. COPD per PCP 6. Carotid artery disease followed by Dr. Kellie Simmering   Current medicines are reviewed at length with the patient today.  The patient does not have concerns regarding medicines.  The following changes have been made:  no change  Labs/ tests ordered today: See above Assessment and Plan No orders of the defined types were placed in this encounter.     Disposition:   FU with me in 6 months  Signed, Sueanne Margarita, MD  12/25/2014 11:05 AM    [facility] [place], [place],   [address] Phone: [phone]; Fax: [phone]

## 2014-12-25 NOTE — Patient Instructions (Signed)

## 2015-01-09 ENCOUNTER — Ambulatory Visit (INDEPENDENT_AMBULATORY_CARE_PROVIDER_SITE_OTHER): Payer: Medicare Other | Admitting: Podiatry

## 2015-01-09 ENCOUNTER — Ambulatory Visit: Payer: Medicare Other | Admitting: Podiatry

## 2015-01-09 DIAGNOSIS — B351 Tinea unguium: Secondary | ICD-10-CM

## 2015-01-09 DIAGNOSIS — M79676 Pain in unspecified toe(s): Secondary | ICD-10-CM

## 2015-01-09 NOTE — Progress Notes (Signed)
Patient ID: Jackie Alvarez, female   DOB: [birth year], 81 y.o.   MRN: [id number]  Subjective: This patient presents for scheduled visit complaining of painful toenails and requesting nail debridement  Objective: No skin lesions bilaterally Amputated second left toe The toenails are brittle, hypertrophic, deformed, discolored and tender to direct palpation 6-10  Assessment: Diabetic with a history of neuropathy and peripheral arterial disease Symptomatic onychomycoses 9  Plan: Debridement toenails 9 mechanically and electrically without any bleeding  Reappoint 3

## 2015-01-09 NOTE — Patient Instructions (Signed)
Diabetes and Foot Care Diabetes may cause you to have problems because of poor blood supply (circulation) to your feet and legs. This may cause the skin on your feet to become thinner, break easier, and heal more slowly. Your skin may become dry, and the skin may peel and crack. You may also have nerve damage in your legs and feet causing decreased feeling in them. You may not notice minor injuries to your feet that could lead to infections or more serious problems. Taking care of your feet is one of the most important things you can do for yourself.  HOME CARE INSTRUCTIONS  Wear shoes at all times, even in the house. Do not go barefoot. Bare feet are easily injured.  Check your feet daily for blisters, cuts, and redness. If you cannot see the bottom of your feet, use a mirror or ask someone for help.  Wash your feet with warm water (do not use hot water) and mild soap. Then pat your feet and the areas between your toes until they are completely dry. Do not soak your feet as this can dry your skin.  Apply a moisturizing lotion or petroleum jelly (that does not contain alcohol and is unscented) to the skin on your feet and to dry, brittle toenails. Do not apply lotion between your toes.  Trim your toenails straight across. Do not dig under them or around the cuticle. File the edges of your nails with an emery board or nail file.  Do not cut corns or calluses or try to remove them with medicine.  Wear clean socks or stockings every day. Make sure they are not too tight. Do not wear knee-high stockings since they may decrease blood flow to your legs.  Wear shoes that fit properly and have enough cushioning. To break in new shoes, wear them for just a few hours a day. This prevents you from injuring your feet. Always look in your shoes before you put them on to be sure there are no objects inside.  Do not cross your legs. This may decrease the blood flow to your feet.  If you find a minor scrape,  cut, or break in the skin on your feet, keep it and the skin around it clean and dry. These areas may be cleansed with mild soap and water. Do not cleanse the area with peroxide, alcohol, or iodine.  When you remove an adhesive bandage, be sure not to damage the skin around it.  If you have a wound, look at it several times a day to make sure it is healing.  Do not use heating pads or hot water bottles. They may burn your skin. If you have lost feeling in your feet or legs, you may not know it is happening until it is too late.  Make sure your health care provider performs a complete foot exam at least annually or more often if you have foot problems. Report any cuts, sores, or bruises to your health care provider immediately. SEEK MEDICAL CARE IF:   You have an injury that is not healing.  You have cuts or breaks in the skin.  You have an ingrown nail.  You notice redness on your legs or feet.  You feel burning or tingling in your legs or feet.  You have pain or cramps in your legs and feet.  Your legs or feet are numb.  Your feet always feel cold. SEEK IMMEDIATE MEDICAL CARE IF:   There is increasing redness,   swelling, or pain in or around a wound.  There is a red line that goes up your leg.  Pus is coming from a wound.  You develop a fever or as directed by your health care provider.  You notice a bad smell coming from an ulcer or wound.   This information is not intended to replace advice given to you by your health care provider. Make sure you discuss any questions you have with your health care provider.   Document Released: 02/21/2000 Document Revised: 10/26/2012 Document Reviewed: 08/02/2012 Elsevier Interactive Patient Education 2016 [employer]  

## 2015-01-22 DIAGNOSIS — M810 Age-related osteoporosis without current pathological fracture: Secondary | ICD-10-CM | POA: Diagnosis not present

## 2015-01-22 DIAGNOSIS — E1140 Type 2 diabetes mellitus with diabetic neuropathy, unspecified: Secondary | ICD-10-CM | POA: Diagnosis not present

## 2015-01-22 DIAGNOSIS — Z7901 Long term (current) use of anticoagulants: Secondary | ICD-10-CM | POA: Diagnosis not present

## 2015-01-22 DIAGNOSIS — E039 Hypothyroidism, unspecified: Secondary | ICD-10-CM | POA: Diagnosis not present

## 2015-01-22 DIAGNOSIS — E785 Hyperlipidemia, unspecified: Secondary | ICD-10-CM | POA: Diagnosis not present

## 2015-01-29 DIAGNOSIS — Z7901 Long term (current) use of anticoagulants: Secondary | ICD-10-CM | POA: Diagnosis not present

## 2015-02-05 DIAGNOSIS — Z7901 Long term (current) use of anticoagulants: Secondary | ICD-10-CM | POA: Diagnosis not present

## 2015-02-21 DIAGNOSIS — Z7901 Long term (current) use of anticoagulants: Secondary | ICD-10-CM | POA: Diagnosis not present

## 2015-02-21 DIAGNOSIS — D509 Iron deficiency anemia, unspecified: Secondary | ICD-10-CM | POA: Diagnosis not present

## 2015-02-21 DIAGNOSIS — E0829 Diabetes mellitus due to underlying condition with other diabetic kidney complication: Secondary | ICD-10-CM | POA: Diagnosis not present

## 2015-02-21 DIAGNOSIS — E559 Vitamin D deficiency, unspecified: Secondary | ICD-10-CM | POA: Diagnosis not present

## 2015-02-21 DIAGNOSIS — Z5181 Encounter for therapeutic drug level monitoring: Secondary | ICD-10-CM | POA: Diagnosis not present

## 2015-02-21 DIAGNOSIS — E039 Hypothyroidism, unspecified: Secondary | ICD-10-CM | POA: Diagnosis not present

## 2015-02-21 DIAGNOSIS — E785 Hyperlipidemia, unspecified: Secondary | ICD-10-CM | POA: Diagnosis not present

## 2015-02-21 DIAGNOSIS — Z0000 Encounter for general adult medical examination without abnormal findings: Secondary | ICD-10-CM | POA: Diagnosis not present

## 2015-03-27 DIAGNOSIS — Z7901 Long term (current) use of anticoagulants: Secondary | ICD-10-CM | POA: Diagnosis not present

## 2015-03-27 DIAGNOSIS — E785 Hyperlipidemia, unspecified: Secondary | ICD-10-CM | POA: Diagnosis not present

## 2015-03-27 DIAGNOSIS — Z5181 Encounter for therapeutic drug level monitoring: Secondary | ICD-10-CM | POA: Diagnosis not present

## 2015-03-29 DIAGNOSIS — E1129 Type 2 diabetes mellitus with other diabetic kidney complication: Secondary | ICD-10-CM | POA: Diagnosis not present

## 2015-03-29 DIAGNOSIS — N183 Chronic kidney disease, stage 3 (moderate): Secondary | ICD-10-CM | POA: Diagnosis not present

## 2015-03-29 DIAGNOSIS — I129 Hypertensive chronic kidney disease with stage 1 through stage 4 chronic kidney disease, or unspecified chronic kidney disease: Secondary | ICD-10-CM | POA: Diagnosis not present

## 2015-03-29 DIAGNOSIS — R809 Proteinuria, unspecified: Secondary | ICD-10-CM | POA: Diagnosis not present

## 2015-03-29 DIAGNOSIS — D631 Anemia in chronic kidney disease: Secondary | ICD-10-CM | POA: Diagnosis not present

## 2015-03-29 DIAGNOSIS — N2581 Secondary hyperparathyroidism of renal origin: Secondary | ICD-10-CM | POA: Diagnosis not present

## 2015-04-16 DIAGNOSIS — H35372 Puckering of macula, left eye: Secondary | ICD-10-CM | POA: Diagnosis not present

## 2015-04-16 DIAGNOSIS — E119 Type 2 diabetes mellitus without complications: Secondary | ICD-10-CM | POA: Diagnosis not present

## 2015-04-16 DIAGNOSIS — D3131 Benign neoplasm of right choroid: Secondary | ICD-10-CM | POA: Diagnosis not present

## 2015-04-16 DIAGNOSIS — H3562 Retinal hemorrhage, left eye: Secondary | ICD-10-CM | POA: Diagnosis not present

## 2015-04-16 DIAGNOSIS — H35373 Puckering of macula, bilateral: Secondary | ICD-10-CM | POA: Diagnosis not present

## 2015-04-16 DIAGNOSIS — H35371 Puckering of macula, right eye: Secondary | ICD-10-CM | POA: Diagnosis not present

## 2015-04-24 ENCOUNTER — Encounter: Payer: Self-pay | Admitting: Podiatry

## 2015-04-24 ENCOUNTER — Ambulatory Visit (INDEPENDENT_AMBULATORY_CARE_PROVIDER_SITE_OTHER): Payer: Medicare Other | Admitting: Podiatry

## 2015-04-24 DIAGNOSIS — Z7901 Long term (current) use of anticoagulants: Secondary | ICD-10-CM | POA: Diagnosis not present

## 2015-04-24 DIAGNOSIS — B351 Tinea unguium: Secondary | ICD-10-CM

## 2015-04-24 DIAGNOSIS — M79676 Pain in unspecified toe(s): Secondary | ICD-10-CM | POA: Diagnosis not present

## 2015-04-24 NOTE — Progress Notes (Signed)
Patient ID: Jackie Alvarez, female   DOB: 1931-10-06, 82 y.o.   MRN: [id number]  Subjective: This patient presents for a scheduled visit complaining of painful toenails are uncomfortable walking wearing shoes and requesting nail debridement  Objective: Orientated 3 No open skin lesions bilaterally Amputated second left toe The toenails are elongated, hypertrophic, brittle, discolored and tender to direct palpation 6-9  Assessment: Diabetic with a history of neuropathy and peripheral arterial disease Symptomatic onychomycoses 9  Plan: Debridement of toenails 9 mechanically and electrically without any bleeding  Reappoint 3 months

## 2015-04-24 NOTE — Patient Instructions (Signed)
Diabetes and Foot Care Diabetes may cause you to have problems because of poor blood supply (circulation) to your feet and legs. This may cause the skin on your feet to become thinner, break easier, and heal more slowly. Your skin may become dry, and the skin may peel and crack. You may also have nerve damage in your legs and feet causing decreased feeling in them. You may not notice minor injuries to your feet that could lead to infections or more serious problems. Taking care of your feet is one of the most important things you can do for yourself.  HOME CARE INSTRUCTIONS  Wear shoes at all times, even in the house. Do not go barefoot. Bare feet are easily injured.  Check your feet daily for blisters, cuts, and redness. If you cannot see the bottom of your feet, use a mirror or ask someone for help.  Wash your feet with warm water (do not use hot water) and mild soap. Then pat your feet and the areas between your toes until they are completely dry. Do not soak your feet as this can dry your skin.  Apply a moisturizing lotion or petroleum jelly (that does not contain alcohol and is unscented) to the skin on your feet and to dry, brittle toenails. Do not apply lotion between your toes.  Trim your toenails straight across. Do not dig under them or around the cuticle. File the edges of your nails with an emery board or nail file.  Do not cut corns or calluses or try to remove them with medicine.  Wear clean socks or stockings every day. Make sure they are not too tight. Do not wear knee-high stockings since they may decrease blood flow to your legs.  Wear shoes that fit properly and have enough cushioning. To break in new shoes, wear them for just a few hours a day. This prevents you from injuring your feet. Always look in your shoes before you put them on to be sure there are no objects inside.  Do not cross your legs. This may decrease the blood flow to your feet.  If you find a minor scrape,  cut, or break in the skin on your feet, keep it and the skin around it clean and dry. These areas may be cleansed with mild soap and water. Do not cleanse the area with peroxide, alcohol, or iodine.  When you remove an adhesive bandage, be sure not to damage the skin around it.  If you have a wound, look at it several times a day to make sure it is healing.  Do not use heating pads or hot water bottles. They may burn your skin. If you have lost feeling in your feet or legs, you may not know it is happening until it is too late.  Make sure your health care provider performs a complete foot exam at least annually or more often if you have foot problems. Report any cuts, sores, or bruises to your health care provider immediately. SEEK MEDICAL CARE IF:   You have an injury that is not healing.  You have cuts or breaks in the skin.  You have an ingrown nail.  You notice redness on your legs or feet.  You feel burning or tingling in your legs or feet.  You have pain or cramps in your legs and feet.  Your legs or feet are numb.  Your feet always feel cold. SEEK IMMEDIATE MEDICAL CARE IF:   There is increasing redness,   swelling, or pain in or around a wound.  There is a red line that goes up your leg.  Pus is coming from a wound.  You develop a fever or as directed by your health care provider.  You notice a bad smell coming from an ulcer or wound.   This information is not intended to replace advice given to you by your health care provider. Make sure you discuss any questions you have with your health care provider.   Document Released: 02/21/2000 Document Revised: 10/26/2012 Document Reviewed: 08/02/2012 Elsevier Interactive Patient Education 2016 [employer]  

## 2015-05-01 DIAGNOSIS — Z7901 Long term (current) use of anticoagulants: Secondary | ICD-10-CM | POA: Diagnosis not present

## 2015-05-15 DIAGNOSIS — H35371 Puckering of macula, right eye: Secondary | ICD-10-CM | POA: Diagnosis not present

## 2015-05-15 DIAGNOSIS — E119 Type 2 diabetes mellitus without complications: Secondary | ICD-10-CM | POA: Diagnosis not present

## 2015-05-15 DIAGNOSIS — H35372 Puckering of macula, left eye: Secondary | ICD-10-CM | POA: Diagnosis not present

## 2015-05-28 ENCOUNTER — Ambulatory Visit
Admission: RE | Admit: 2015-05-28 | Discharge: 2015-05-28 | Disposition: A | Discharge status: Home / Self Care | Payer: Medicare Other | Source: Ambulatory Visit | Attending: Family Medicine | Admitting: Family Medicine

## 2015-05-28 ENCOUNTER — Other Ambulatory Visit: Payer: Self-pay | Admitting: Family Medicine

## 2015-05-28 DIAGNOSIS — R05 Cough: Secondary | ICD-10-CM

## 2015-05-28 DIAGNOSIS — E1122 Type 2 diabetes mellitus with diabetic chronic kidney disease: Secondary | ICD-10-CM | POA: Diagnosis not present

## 2015-05-28 DIAGNOSIS — D649 Anemia, unspecified: Secondary | ICD-10-CM | POA: Diagnosis not present

## 2015-05-28 DIAGNOSIS — R059 Cough, unspecified: Secondary | ICD-10-CM

## 2015-05-28 DIAGNOSIS — E039 Hypothyroidism, unspecified: Secondary | ICD-10-CM | POA: Diagnosis not present

## 2015-05-28 DIAGNOSIS — Z7901 Long term (current) use of anticoagulants: Secondary | ICD-10-CM | POA: Diagnosis not present

## 2015-05-28 DIAGNOSIS — N183 Chronic kidney disease, stage 3 (moderate): Secondary | ICD-10-CM | POA: Diagnosis not present

## 2015-05-28 DIAGNOSIS — R0602 Shortness of breath: Secondary | ICD-10-CM | POA: Diagnosis not present

## 2015-05-28 DIAGNOSIS — E785 Hyperlipidemia, unspecified: Secondary | ICD-10-CM | POA: Diagnosis not present

## 2015-06-04 DIAGNOSIS — Z7901 Long term (current) use of anticoagulants: Secondary | ICD-10-CM | POA: Diagnosis not present

## 2015-06-25 ENCOUNTER — Ambulatory Visit (INDEPENDENT_AMBULATORY_CARE_PROVIDER_SITE_OTHER): Payer: Medicare Other | Admitting: Cardiology

## 2015-06-25 ENCOUNTER — Encounter: Payer: Self-pay | Admitting: Cardiology

## 2015-06-25 VITALS — BP 146/58 | HR 70 | Ht 66.0 in | Wt 149.4 lb

## 2015-06-25 DIAGNOSIS — I10 Essential (primary) hypertension: Secondary | ICD-10-CM

## 2015-06-25 DIAGNOSIS — I779 Disorder of arteries and arterioles, unspecified: Secondary | ICD-10-CM | POA: Insufficient documentation

## 2015-06-25 DIAGNOSIS — I5032 Chronic diastolic (congestive) heart failure: Secondary | ICD-10-CM | POA: Diagnosis not present

## 2015-06-25 DIAGNOSIS — I272 Other secondary pulmonary hypertension: Secondary | ICD-10-CM

## 2015-06-25 DIAGNOSIS — I480 Paroxysmal atrial fibrillation: Secondary | ICD-10-CM

## 2015-06-25 DIAGNOSIS — Z7901 Long term (current) use of anticoagulants: Secondary | ICD-10-CM | POA: Diagnosis not present

## 2015-06-25 DIAGNOSIS — I739 Peripheral vascular disease, unspecified: Secondary | ICD-10-CM

## 2015-06-25 LAB — PROTIME-INR: INR: 1.7 — AB (ref 0.9–1.1)

## 2015-06-25 NOTE — Progress Notes (Signed)
Cardiology Office Note    Date:  06/25/2015   ID:  Jackie Alvarez, DOB 1932/01/31, MRN [id number]  PCP:  Jonathon Bellows, MD  Cardiologist:  Sueanne Margarita, MD   Chief Complaint  Patient presents with  . Chronic diastolic heart failure  . Atrial Fibrillation    History of Present Illness:  Jackie Alvarez is a 82 y.o. female with a history of chronic diastolic CHF, HTN, CKD stage III, remote atrial flutter and mild AS. er last echo showed mildly thickened AV with no AS. She now presents back for followup. She denies any chest pain or pressure, SOB, DOE, PND, orthopnea, palpitations, LE edema, dizziness or syncope.      Past Medical History  Diagnosis Date  . Diabetes mellitus   . Hypertension   . Hyperlipidemia   . Ulcer 2009    gatric with history of bleeding - resolved  . COPD (chronic obstructive pulmonary disease) ([place])   . Cancer [facility]) 1994    Left Breast  . Carotid artery stenosis     mild bilateral, < 40% bilateral  . Anxiety   . Central retinal vein occlusion, right eye   . Anemia   . Chronic kidney disease     CKD stage III  . Atrial flutter ([place]) 2008    remote  . Thyroid disease     toxic multinodular goiter with history of thyrotoxicosis now s/p RAI ablative therapy - now hypothyroid  . Depression   . Peripheral neuropathy ([place])   . GERD (gastroesophageal reflux disease)   . Obesity   . Hiatal hernia   . Schatzki's ring   . Cholelithiases   . Chronic diastolic CHF (congestive heart failure) ([place])   . Pulmonary HTN ([place])     mild PASP 77mmHg by echo 12/2012    Past Surgical History  Procedure Laterality Date  . Wrist surgery  02/2009  . Cholecystectomy      Gall Bladder  . Pr vein bypass graft,aorto-fem-pop    . Abdominal hysterectomy      with BSO  . Breast lumpectomy    . Abdominal aortagram  Jan. 18, 2013  . Hip surgery Left   . Abdominal aortagram N/A 03/17/2011    Procedure: ABDOMINAL Maxcine Ham;  Surgeon: Serafina Mitchell, MD;  Location:  [facility] CATH LAB;  Service: Cardiovascular;  Laterality: N/A;    Current Medications: Outpatient Prescriptions Prior to Visit  Medication Sig Dispense Refill  . amLODipine (NORVASC) 10 MG tablet Take 10 mg by mouth daily.      . citalopram (CELEXA) 40 MG tablet Take 40 mg by mouth daily.    . fluticasone (FLONASE) 50 MCG/ACT nasal spray Place 2 sprays into both nostrils daily.    . furosemide (LASIX) 40 MG tablet Take 40 mg by mouth daily. Take 2 tablet in AM and take 1 tablet in the PM    . hydrALAZINE (APRESOLINE) 100 MG tablet Take 100 mg by mouth 3 (three) times daily.    Marland Kitchen levothyroxine (SYNTHROID, LEVOTHROID) 88 MCG tablet Take 88 mcg by mouth daily before breakfast.    . mirtazapine (REMERON) 30 MG tablet Take 30 mg by mouth every morning.    . potassium chloride SA (K-DUR,KLOR-CON) 20 MEQ tablet Take 20 mEq by mouth daily.    . sitaGLIPtin (JANUVIA) 100 MG tablet Take 50 mg by mouth daily.     Marland Kitchen warfarin (COUMADIN) 1 MG tablet Take 1 mg by mouth as directed. Take 2 mg on  Sunday, Monday, Wednesday and Friday. 1.5 mg on Tuesday and Thursday     No facility-administered medications prior to visit.     Allergies:   Metformin and related and Omnipaque   Social History   Social History  . Marital Status: Married    Spouse Name: N/A  . Number of Children: 1  . Years of Education: N/A   Occupational History  . retired    Social History Main Topics  . Smoking status: Former Smoker -- 1.00 packs/day for 45 years    Types: Cigarettes    Quit date: 01/26/1997  . Smokeless tobacco: Never Used  . Alcohol Use: No  . Drug Use: No  . Sexual Activity: No   Other Topics Concern  . None   Social History Narrative   Son lives with her in her house.      Family History:  The patient's family history includes Cancer in her father, mother, and sister; Cirrhosis in her father; Diabetes in her sister.   ROS:   Please see the history of present illness.    Review of Systems    Constitution: Negative.  HENT: Negative.   Eyes: Negative.   Cardiovascular: Negative.   Respiratory: Negative.   Skin: Negative.   Musculoskeletal: Negative.   Gastrointestinal: Negative.   Genitourinary: Negative.   Neurological: Negative.   Psychiatric/Behavioral: Negative.    All other systems reviewed and are negative.   PHYSICAL EXAM:   VS:  BP 146/58 mmHg  Pulse 70  Ht 5\' 6"  (1.676 m)  Wt 149 lb 6.4 oz (67.767 kg)  BMI 24.13 kg/m2   GEN: Well nourished, well developed, in no acute distress HEENT: normal Neck: no JVD, carotid bruits, or masses Cardiac: RRR; no murmurs, rubs, or gallops.  Trace dema.  Intact distal pulses bilaterally.  Respiratory:  clear to auscultation bilaterally, normal work of breathing GI: soft, nontender, nondistended, + BS MS: no deformity or atrophy Skin: warm and dry, no rash Neuro:  Alert and Oriented x 3, Strength and sensation are intact Psych: euthymic mood, full affect  Wt Readings from Last 3 Encounters:  06/25/15 149 lb 6.4 oz (67.767 kg)  12/25/14 144 lb (65.318 kg)  07/26/14 149 lb (67.586 kg)      Studies/Labs Reviewed:   EKG:  EKG is ordered today shows NSR qt 70bpm with PACs and no ST changes and septal infarct.    Recent Labs: No results found for requested labs within last 365 days.   Lipid Panel No results found for: CHOL, TRIG, HDL, CHOLHDL, VLDL, LDLCALC, LDLDIRECT  Additional studies/ records that were reviewed today include:  none    ASSESSMENT:    1. Chronic diastolic heart failure (HCC)   2. Paroxysmal atrial fibrillation ([place])   3. Essential hypertension   4. Pulmonary HTN (HCC)   5. Carotid artery disease, unspecified laterality ([place])      PLAN:  In order of problems listed above:  1. Chronic diastolic CHF - appears euvolemic on exam 2. PAF - maintaining NSR.  Continue warfarin 3. HTN - BP borderline controlled on current regimen.  Continue amlodipine/Hydralazine.  Encouraged her to follow a  low sodium diet. 4. Pulmonary HTN - moderate by echo 2014 with PASP 42mmHg - repeat echo 5. Carotid artery disease - bilateral stenosis < 40% - she gets dopplers done at [facility]..  On warfarin.    Check FLP from PCP office.     Medication Adjustments/Labs and Tests Ordered: Current medicines are  reviewed at length with the patient today.  Concerns regarding medicines are outlined above.  Medication changes, Labs and Tests ordered today are listed in the Patient Instructions below. There are no Patient Instructions on file for this visit.   Lurena Nida, MD  06/25/2015 9:37 AM    [facility] [place], [place], [place]  [address] Phone: [phone]; Fax: [phone]

## 2015-06-25 NOTE — Patient Instructions (Signed)

## 2015-06-26 ENCOUNTER — Encounter: Payer: Self-pay | Admitting: Family

## 2015-07-02 ENCOUNTER — Other Ambulatory Visit: Payer: Self-pay | Admitting: Vascular Surgery

## 2015-07-02 ENCOUNTER — Encounter: Payer: Self-pay | Admitting: Family

## 2015-07-02 ENCOUNTER — Ambulatory Visit (INDEPENDENT_AMBULATORY_CARE_PROVIDER_SITE_OTHER)
Admission: RE | Admit: 2015-07-02 | Discharge: 2015-07-02 | Disposition: A | Discharge status: Home / Self Care | Payer: Medicare Other | Source: Ambulatory Visit | Attending: Family | Admitting: Family

## 2015-07-02 ENCOUNTER — Ambulatory Visit (INDEPENDENT_AMBULATORY_CARE_PROVIDER_SITE_OTHER): Payer: Medicare Other | Admitting: Family

## 2015-07-02 ENCOUNTER — Ambulatory Visit (HOSPITAL_COMMUNITY)
Admission: RE | Admit: 2015-07-02 | Discharge: 2015-07-02 | Disposition: A | Discharge status: Home / Self Care | Payer: Medicare Other | Source: Ambulatory Visit | Attending: Family | Admitting: Family

## 2015-07-02 ENCOUNTER — Ambulatory Visit (INDEPENDENT_AMBULATORY_CARE_PROVIDER_SITE_OTHER)
Admission: RE | Admit: 2015-07-02 | Discharge: 2015-07-02 | Disposition: A | Discharge status: Home / Self Care | Payer: Medicare Other | Source: Ambulatory Visit | Attending: Vascular Surgery | Admitting: Vascular Surgery

## 2015-07-02 VITALS — BP 140/46 | HR 76 | Temp 97.1°F | Resp 16 | Ht 66.0 in | Wt 144.0 lb

## 2015-07-02 DIAGNOSIS — K219 Gastro-esophageal reflux disease without esophagitis: Secondary | ICD-10-CM | POA: Diagnosis not present

## 2015-07-02 DIAGNOSIS — E785 Hyperlipidemia, unspecified: Secondary | ICD-10-CM | POA: Insufficient documentation

## 2015-07-02 DIAGNOSIS — Z87891 Personal history of nicotine dependence: Secondary | ICD-10-CM

## 2015-07-02 DIAGNOSIS — I6529 Occlusion and stenosis of unspecified carotid artery: Secondary | ICD-10-CM

## 2015-07-02 DIAGNOSIS — N183 Chronic kidney disease, stage 3 (moderate): Secondary | ICD-10-CM | POA: Insufficient documentation

## 2015-07-02 DIAGNOSIS — R938 Abnormal findings on diagnostic imaging of other specified body structures: Secondary | ICD-10-CM | POA: Diagnosis not present

## 2015-07-02 DIAGNOSIS — Z48812 Encounter for surgical aftercare following surgery on the circulatory system: Secondary | ICD-10-CM

## 2015-07-02 DIAGNOSIS — I70201 Unspecified atherosclerosis of native arteries of extremities, right leg: Secondary | ICD-10-CM | POA: Diagnosis not present

## 2015-07-02 DIAGNOSIS — Z7901 Long term (current) use of anticoagulants: Secondary | ICD-10-CM | POA: Diagnosis not present

## 2015-07-02 DIAGNOSIS — Z95828 Presence of other vascular implants and grafts: Secondary | ICD-10-CM

## 2015-07-02 DIAGNOSIS — E1122 Type 2 diabetes mellitus with diabetic chronic kidney disease: Secondary | ICD-10-CM | POA: Diagnosis not present

## 2015-07-02 DIAGNOSIS — Z4889 Encounter for other specified surgical aftercare: Secondary | ICD-10-CM

## 2015-07-02 DIAGNOSIS — I739 Peripheral vascular disease, unspecified: Secondary | ICD-10-CM | POA: Diagnosis not present

## 2015-07-02 DIAGNOSIS — I130 Hypertensive heart and chronic kidney disease with heart failure and stage 1 through stage 4 chronic kidney disease, or unspecified chronic kidney disease: Secondary | ICD-10-CM | POA: Insufficient documentation

## 2015-07-02 DIAGNOSIS — R0989 Other specified symptoms and signs involving the circulatory and respiratory systems: Secondary | ICD-10-CM | POA: Diagnosis present

## 2015-07-02 DIAGNOSIS — I5032 Chronic diastolic (congestive) heart failure: Secondary | ICD-10-CM | POA: Insufficient documentation

## 2015-07-02 DIAGNOSIS — I6523 Occlusion and stenosis of bilateral carotid arteries: Secondary | ICD-10-CM

## 2015-07-02 DIAGNOSIS — E1142 Type 2 diabetes mellitus with diabetic polyneuropathy: Secondary | ICD-10-CM | POA: Diagnosis not present

## 2015-07-02 NOTE — Progress Notes (Signed)
[facility] [place]   CC: Follow up extracranial carotid artery stenosis and PAD  History of Present Illness Jackie Alvarez is a 82 y.o. female patient of Dr. Kellie Simmering followed for known extracranial carotid artery stenosis as well as a past vascular history of multiple surgeries including an aortobifem bypass graft in 1998 and 2001 a left profunda femoris to peroneal bypass graft using lesser saphenous vein with multiple revisions of the distal anastomosis.  She returns today for follow up.  Patient has Negative history of TIA or stroke symptom. The patient denies amaurosis fugax or monocular blindness. The patient denies facial drooping.  Pt. denies hemiplegia. The patient denies receptive or expressive aphasia.  She had pneumonia October 2015, was treated as an out patient, lost her appetite and lost 32 pounds; since this weight loss she has had no more claudication. She denies post prandial abdominal pain, denies any abdominal or back pain.   Pt denies steal symptoms in either arm.  Pt lost 9 pounds in a year. She states she feels well, no longer has DM. She denies any claudication sx's with walking.   She sees Dr. Mercy Moore for CRF, he added carvedilol.   Pt Diabetic: no longer has DM since she lost so much weight Pt smoker: former smoker, quit in 1994   Pt meds include:  Statin : Yes  ASA: No  Other anticoagulants/antiplatelets: coumadin, possibly for atrial flutter, was started Oct., 2014, per pt., her heart rate was in the 30's, does not have a pacemaker.   Past Medical History  Diagnosis Date  . Diabetes mellitus   . Hypertension   . Hyperlipidemia   . Ulcer 2009    gatric with history of bleeding - resolved  . COPD (chronic obstructive pulmonary disease) ([place])   . Cancer [facility]) 1994    Left Breast  . Carotid artery stenosis     mild bilateral, < 40% bilateral  . Anxiety   . Central retinal vein occlusion, right eye   . Anemia   . Chronic  kidney disease     CKD stage III  . Atrial flutter ([place]) 2008    remote  . Thyroid disease     toxic multinodular goiter with history of thyrotoxicosis now s/p RAI ablative therapy - now hypothyroid  . Depression   . Peripheral neuropathy ([place])   . GERD (gastroesophageal reflux disease)   . Obesity   . Hiatal hernia   . Schatzki's ring   . Cholelithiases   . Chronic diastolic CHF (congestive heart failure) ([place])   . Pulmonary HTN ([place])     mild PASP 21mmHg by echo 12/2012    Social History Social History  Substance Use Topics  . Smoking status: Former Smoker -- 1.00 packs/day for 45 years    Types: Cigarettes    Quit date: 01/26/1997  . Smokeless tobacco: Never Used  . Alcohol Use: No    Family History Family History  Problem Relation Age of Onset  . Cancer Mother     stomach  . Cancer Father     liver  . Cirrhosis Father   . Cancer Sister   . Diabetes Sister     Past Surgical History  Procedure Laterality Date  . Wrist surgery  02/2009  . Cholecystectomy      Gall Bladder  . Pr vein bypass graft,aorto-fem-pop    . Abdominal hysterectomy      with BSO  . Breast lumpectomy    . Abdominal  aortagram  Jan. 18, 2013  . Hip surgery Left   . Abdominal aortagram N/A 03/17/2011    Procedure: ABDOMINAL Maxcine Ham;  Surgeon: Serafina Mitchell, MD;  Location: [facility] CATH LAB;  Service: Cardiovascular;  Laterality: N/A;    Allergies  Allergen Reactions  . Metformin And Related Other (See Comments)    Renal insuff  . Omnipaque [Iohexol] Nausea And Vomiting    Current Outpatient Prescriptions  Medication Sig Dispense Refill  . amLODipine (NORVASC) 10 MG tablet Take 10 mg by mouth daily.      . citalopram (CELEXA) 40 MG tablet Take 40 mg by mouth daily.    . fluticasone (FLONASE) 50 MCG/ACT nasal spray Place 2 sprays into both nostrils daily.    . furosemide (LASIX) 40 MG tablet Take 40 mg by mouth daily. Take 2 tablet in AM and take 1 tablet in the PM    . hydrALAZINE  (APRESOLINE) 100 MG tablet Take 100 mg by mouth 3 (three) times daily.    Marland Kitchen levothyroxine (SYNTHROID, LEVOTHROID) 88 MCG tablet Take 88 mcg by mouth daily before breakfast.    . mirtazapine (REMERON) 30 MG tablet Take 30 mg by mouth every morning.    . potassium chloride SA (K-DUR,KLOR-CON) 20 MEQ tablet Take 20 mEq by mouth daily.    Marland Kitchen warfarin (COUMADIN) 1 MG tablet Take 1 mg by mouth as directed. Take 2 mg on Sunday, Monday, Wednesday and Friday. 1.5 mg on Tuesday and Thursday     No current facility-administered medications for this visit.    ROS: See HPI for pertinent positives and negatives.   Physical Examination  Filed Vitals:   07/02/15 1040  BP: 140/46  Pulse: 76  Temp: 97.1 F (36.2 C)  TempSrc: Oral  Resp: 16  Height: 5\' 6"  (1.676 m)  Weight: 144 lb (65.318 kg)  SpO2: 94%   Body mass index is 23.25 kg/(m^2).  General: WDWN female in NAD  GAIT: normal Eyes: PERRLA  Pulmonary: Respirations are non labored, CTAB Cardiac: Irregular rhythm, + murmur.   VASCULAR EXAM  Carotid Bruits  Left  Right    Negative  Transmitted cardiac murmur  Aorta is palpable.  Radial pulses are 1+ right, 2+ left palpable.   LE Pulses  LEFT  RIGHT   FEMORAL  palpable  palpable   POPLITEAL  not palpable  not palpable   POSTERIOR TIBIAL  not palpable  not palpable   DORSALIS PEDIS  ANTERIOR TIBIAL  1+ palpable  not palpable    Gastrointestinal: soft, nontender, BS WNL, no r/g, no palpable masses Musculoskeletal: No muscle atrophy/wasting. M/S 4/5throughout, Extremities without ischemic changes. No peripheral edema.  Neurologic: A&O X 3; Appropriate Affect, Speech is normal  CN 2-12 intact, Pain and light touch intact in extremities, Motor exam as listed above.                 Non-Invasive Vascular Imaging: DATE: 07/02/2015    CEREBROVASCULAR DUPLEX EVALUATION    INDICATION: Carotid artery disease    PREVIOUS  INTERVENTION(S): NA    DUPLEX EXAM: Carotid duplex    RIGHT  LEFT  Peak Systolic Velocities (cm/s) End Diastolic Velocities (cm/s) Plaque LOCATION Peak Systolic Velocities (cm/s) End Diastolic Velocities (cm/s) Plaque  114 15 HT CCA PROXIMAL 128 16 HT  108 11 HT CCA MID 130 18 HT  109 17 HT CCA DISTAL 142 19 HT  187 12 HT ECA 164 13 HT  120 29 HT ICA PROXIMAL 164 27 HT  191 45 HT ICA MID 135 30   154 41  ICA DISTAL 116 21     1.1 ICA / CCA Ratio (PSV) 1.2  Antegrade Vertebral Flow Antegrade  - Brachial Systolic Pressure (mmHg) -  Triphasic Brachial Artery Waveforms Triphasic    Plaque Morphology:  HM = Homogeneous, HT = Heterogeneous, CP = Calcific Plaque, SP = Smooth Plaque, IP = Irregular Plaque     ADDITIONAL FINDINGS: Triphasic right subclavian artery, Biphaic left subclavian artery    IMPRESSION: 1. Less than 40% proximal bilateral internal carotid artery stenosis with increased velocity in the right mid to distal segment of the internal carotid artery, limited visualization    Compared to the previous exam:  Increase of right mid to distal diastolic velocity since exam of 06/29/2014       LOWER EXTREMITY ARTERIAL EVALUATION    INDICATION: Peripheral vascular disease    PREVIOUS INTERVENTION(S): Aorto-bi-femoral bypass graft 1998; Left PFA to peroneal bypass graft with multiple distal anastomotic revisions latest on 03/17/2011    DUPLEX EXAM: Duplex evaluation of the lower extremity arterial system to include the common femoral, superficial femoral, popliteal, and tibial arteries and bypass graft(s) and/or stent(s) if present.      FINDINGS:                                       RIGHT LOWER EXTREMITY:   Heterogenous plaque throughout the lower extremity with hemodynamically significant stenosis   LEFT LOWER EXTREMITY:   See impression    See the attached diagram for velocities.  IMPRESSION:  1. 50 - 74%  stenosis involving the right mid to distal superficial femoral  artery   2. Patent left profunda femoral to peroneal artery bypass graft without visualized stenosis    ANKLE/BRACHIAL INDEX - Right =  1.4                    Left =    .97        (Please see complete report)                 ASSESSMENT: Jackie Alvarez is a 82 y.o. female with known extracranial carotid artery stenosis as well as a past vascular history of multiple surgeries including an aortobifem bypass graft in 1998 and 2001 a left profunda femoris to peroneal bypass graft using lesser saphenous vein with multiple revisions of the distal anastomosis.  She no longer has lower extremity claudication since she lost 32 pounds after a long bout of pneumonia.  She has no signs of ischemia in her feet/legs.  She has no history of stroke or TIA. She feels very well.  Today's carotid Duplex suggestsl ess than 40% proximal bilateral internal carotid artery stenosis with increased velocity in the right mid to distal segment of the internal carotid artery, limited visualization. No significant change since exam of 06/29/14.  Today's bilateral LE arterial duplex suggests 50 - 74%  stenosis involving the right mid to distal superficial femoral artery.  Patent left profunda femoral to peroneal artery bypass graft without visualized stenosis.  Bilateral ABI's indicate non-compressible vessels (medial calcification), bilateral waveforms are brisk monophasic. Right TBI is 0.29, left TBI is 0.61.  Her atherosclerotic risk factors include former diabetic and former smoker. She takes a statin and coumadin.    PLAN:  Based on the patient's vascular studies and examination, pt will return  to clinic in 1 year with carotid duplex, ABI's, and left LE arterial duplex. I advised pt to notify us should she develop concerns re the circulation in her legs.  I discussed in depth with the patient the nature of atherosclerosis, and emphasized the importance of maximal medical management including  strict control of blood pressure, blood glucose, and lipid levels, obtaining regular exercise, and continued cessation of smoking.  The patient is aware that without maximal medical management the underlying atherosclerotic disease process will progress, limiting the benefit of any interventions.  The patient was given information about PAD including signs, symptoms, treatment, what symptoms should prompt the patient to seek immediate medical care, and risk reduction measures to take.  Clemon Chambers, RN, MSN, FNP-C [facility] Phone: [phone]  Clinic MD: Kellie Simmering  07/02/2015 10:45 AM

## 2015-07-02 NOTE — Patient Instructions (Signed)
Peripheral Vascular Disease Peripheral vascular disease (PVD) is a disease of the blood vessels that are not part of your heart and brain. A simple term for PVD is poor circulation. In most cases, PVD narrows the blood vessels that carry blood from your heart to the rest of your body. This can result in a decreased supply of blood to your arms, legs, and internal organs, like your stomach or kidneys. However, it most often affects a person's lower legs and feet. There are two types of PVD.  Organic PVD. This is the more common type. It is caused by damage to the structure of blood vessels.  Functional PVD. This is caused by conditions that make blood vessels contract and tighten (spasm). Without treatment, PVD tends to get worse over time. PVD can also lead to acute ischemic limb. This is when an arm or limb suddenly has trouble getting enough blood. This is a medical emergency. CAUSES Each type of PVD has many different causes. The most common cause of PVD is buildup of a fatty material (plaque) inside of your arteries (atherosclerosis). Small amounts of plaque can break off from the walls of the blood vessels and become lodged in a smaller artery. This blocks blood flow and can cause acute ischemic limb. Other common causes of PVD include:  Blood clots that form inside of blood vessels.  Injuries to blood vessels.  Diseases that cause inflammation of blood vessels or cause blood vessel spasms.  Health behaviors and health history that increase your risk of developing PVD. RISK FACTORS  You may have a greater risk of PVD if you:  Have a family history of PVD.  Have certain medical conditions, including:  High cholesterol.  Diabetes.  High blood pressure (hypertension).  Coronary heart disease.  Past problems with blood clots.  Past injury, such as burns or a broken bone. These may have damaged blood vessels in your limbs.  Buerger disease. This is caused by inflamed blood  vessels in your hands and feet.  Some forms of arthritis.  Rare birth defects that affect the arteries in your legs.  Use tobacco.  Do not get enough exercise.  Are obese.  Are age 50 or older. SIGNS AND SYMPTOMS  PVD may cause many different symptoms. Your symptoms depend on what part of your body is not getting enough blood. Some common signs and symptoms include:  Cramps in your lower legs. This may be a symptom of poor leg circulation (claudication).  Pain and weakness in your legs while you are physically active that goes away when you rest (intermittent claudication).  Leg pain when at rest.  Leg numbness, tingling, or weakness.  Coldness in a leg or foot, especially when compared with the other leg.  Skin or hair changes. These can include:  Hair loss.  Shiny skin.  Pale or bluish skin.  Thick toenails.  Inability to get or maintain an erection (erectile dysfunction). People with PVD are more prone to developing ulcers and sores on their toes, feet, or legs. These may take longer than normal to heal. DIAGNOSIS Your health care provider may diagnose PVD from your signs and symptoms. The health care provider will also do a physical exam. You may have tests to find out what is causing your PVD and determine its severity. Tests may include:  Blood pressure recordings from your arms and legs and measurements of the strength of your pulses (pulse volume recordings).  Imaging studies using sound waves to take pictures of   the blood flow through your blood vessels (Doppler ultrasound).  Injecting a dye into your blood vessels before having imaging studies using:  X-rays (angiogram or arteriogram).  Computer-generated X-rays (CT angiogram).  A powerful electromagnetic field and a computer (magnetic resonance angiogram or MRA). TREATMENT Treatment for PVD depends on the cause of your condition and the severity of your symptoms. It also depends on your age. Underlying  causes need to be treated and controlled. These include long-lasting (chronic) conditions, such as diabetes, high cholesterol, and high blood pressure. You may need to first try making lifestyle changes and taking medicines. Surgery may be needed if these do not work. Lifestyle changes may include:  Quitting smoking.  Exercising regularly.  Following a low-fat, low-cholesterol diet. Medicines may include:  Blood thinners to prevent blood clots.  Medicines to improve blood flow.  Medicines to improve your blood cholesterol levels. Surgical procedures may include:  A procedure that uses an inflated balloon to open a blocked artery and improve blood flow (angioplasty).  A procedure to put in a tube (stent) to keep a blocked artery open (stent implant).  Surgery to reroute blood flow around a blocked artery (peripheral bypass surgery).  Surgery to remove dead tissue from an infected wound on the affected limb.  Amputation. This is surgical removal of the affected limb. This may be necessary in cases of acute ischemic limb that are not improved through medical or surgical treatments. HOME CARE INSTRUCTIONS  Take medicines only as directed by your health care provider.  Do not use any tobacco products, including cigarettes, chewing tobacco, or electronic cigarettes. If you need help quitting, ask your health care provider.  Lose weight if you are overweight, and maintain a healthy weight as directed by your health care provider.  Eat a diet that is low in fat and cholesterol. If you need help, ask your health care provider.  Exercise regularly. Ask your health care provider to suggest some good activities for you.  Use compression stockings or other mechanical devices as directed by your health care provider.  Take good care of your feet.  Wear comfortable shoes that fit well.  Check your feet often for any cuts or sores. SEEK MEDICAL CARE IF:  You have cramps in your legs  while walking.  You have leg pain when you are at rest.  You have coldness in a leg or foot.  Your skin changes.  You have erectile dysfunction.  You have cuts or sores on your feet that are not healing. SEEK IMMEDIATE MEDICAL CARE IF:  Your arm or leg turns cold and blue.  Your arms or legs become red, warm, swollen, painful, or numb.  You have chest pain or trouble breathing.  You suddenly have weakness in your face, arm, or leg.  You become very confused or lose the ability to speak.  You suddenly have a very bad headache or lose your vision.   This information is not intended to replace advice given to you by your health care provider. Make sure you discuss any questions you have with your health care provider.   Document Released: 04/02/2004 Document Revised: 03/16/2014 Document Reviewed: 08/03/2013 Elsevier Interactive Patient Education 2016 [employer]  

## 2015-07-05 ENCOUNTER — Encounter: Payer: Self-pay | Admitting: Cardiology

## 2015-07-09 ENCOUNTER — Telehealth: Payer: Self-pay

## 2015-07-09 ENCOUNTER — Ambulatory Visit (HOSPITAL_COMMUNITY): Payer: Medicare Other | Attending: Internal Medicine

## 2015-07-09 ENCOUNTER — Other Ambulatory Visit: Payer: Self-pay

## 2015-07-09 DIAGNOSIS — Z87891 Personal history of nicotine dependence: Secondary | ICD-10-CM | POA: Diagnosis not present

## 2015-07-09 DIAGNOSIS — I739 Peripheral vascular disease, unspecified: Secondary | ICD-10-CM | POA: Diagnosis not present

## 2015-07-09 DIAGNOSIS — I10 Essential (primary) hypertension: Secondary | ICD-10-CM | POA: Diagnosis not present

## 2015-07-09 DIAGNOSIS — I350 Nonrheumatic aortic (valve) stenosis: Secondary | ICD-10-CM | POA: Insufficient documentation

## 2015-07-09 DIAGNOSIS — I340 Nonrheumatic mitral (valve) insufficiency: Secondary | ICD-10-CM | POA: Diagnosis not present

## 2015-07-09 DIAGNOSIS — E785 Hyperlipidemia, unspecified: Secondary | ICD-10-CM

## 2015-07-09 DIAGNOSIS — I272 Other secondary pulmonary hypertension: Secondary | ICD-10-CM | POA: Diagnosis not present

## 2015-07-09 DIAGNOSIS — I2510 Atherosclerotic heart disease of native coronary artery without angina pectoris: Secondary | ICD-10-CM | POA: Insufficient documentation

## 2015-07-09 DIAGNOSIS — Z7901 Long term (current) use of anticoagulants: Secondary | ICD-10-CM | POA: Diagnosis not present

## 2015-07-09 DIAGNOSIS — I779 Disorder of arteries and arterioles, unspecified: Secondary | ICD-10-CM

## 2015-07-09 MED ORDER — ATORVASTATIN CALCIUM 40 MG PO TABS: 40.0000 mg | ORAL_TABLET | Freq: Every day | ORAL | Status: DC

## 2015-07-09 NOTE — Telephone Encounter (Signed)
Instructed patient to START LIPITOR 40 mg daily. FLP and ALT scheduled 6/30. Patient agrees with treatment plan.

## 2015-07-09 NOTE — Telephone Encounter (Signed)
-----   Message from Sueanne Margarita, MD sent at 07/06/2015  9:29 PM EDT ----- LDL 168 and goal < 70 with carotid vascular disease.  Start Lipitor 40mg  daily and repeat FLP and ALT in 6 [place]

## 2015-07-11 ENCOUNTER — Telehealth: Payer: Self-pay | Admitting: *Deleted

## 2015-07-11 DIAGNOSIS — I272 Pulmonary hypertension, unspecified: Secondary | ICD-10-CM

## 2015-07-11 NOTE — Telephone Encounter (Signed)
-----   Message from Sueanne Margarita, MD sent at 07/10/2015 10:01 PM EDT ----- Echo showed normal LVF with increased stiffness of heart muscle, mild AS - repeat echo in 1 year

## 2015-07-11 NOTE — Telephone Encounter (Signed)
Spoke with pt and informed her of echo results. Pt verbalized understanding and was in agreement with this plan.

## 2015-07-17 DIAGNOSIS — H7201 Central perforation of tympanic membrane, right ear: Secondary | ICD-10-CM | POA: Diagnosis not present

## 2015-07-17 DIAGNOSIS — H903 Sensorineural hearing loss, bilateral: Secondary | ICD-10-CM | POA: Diagnosis not present

## 2015-07-17 DIAGNOSIS — H6123 Impacted cerumen, bilateral: Secondary | ICD-10-CM | POA: Diagnosis not present

## 2015-07-23 DIAGNOSIS — Z7901 Long term (current) use of anticoagulants: Secondary | ICD-10-CM | POA: Diagnosis not present

## 2015-07-30 DIAGNOSIS — Z7901 Long term (current) use of anticoagulants: Secondary | ICD-10-CM | POA: Diagnosis not present

## 2015-07-31 ENCOUNTER — Ambulatory Visit: Payer: Medicare Other | Admitting: Podiatry

## 2015-08-06 NOTE — Addendum Note (Signed)
Addended by: Mena Goes on: 08/06/2015 03:54 PM   Modules accepted: Orders

## 2015-08-19 DIAGNOSIS — Z853 Personal history of malignant neoplasm of breast: Secondary | ICD-10-CM | POA: Diagnosis not present

## 2015-08-19 DIAGNOSIS — E1140 Type 2 diabetes mellitus with diabetic neuropathy, unspecified: Secondary | ICD-10-CM | POA: Diagnosis not present

## 2015-08-19 DIAGNOSIS — I6529 Occlusion and stenosis of unspecified carotid artery: Secondary | ICD-10-CM | POA: Diagnosis not present

## 2015-08-19 DIAGNOSIS — I739 Peripheral vascular disease, unspecified: Secondary | ICD-10-CM | POA: Diagnosis not present

## 2015-08-19 DIAGNOSIS — J439 Emphysema, unspecified: Secondary | ICD-10-CM | POA: Diagnosis not present

## 2015-08-19 DIAGNOSIS — I5032 Chronic diastolic (congestive) heart failure: Secondary | ICD-10-CM | POA: Diagnosis not present

## 2015-08-19 DIAGNOSIS — Z1231 Encounter for screening mammogram for malignant neoplasm of breast: Secondary | ICD-10-CM | POA: Diagnosis not present

## 2015-08-19 DIAGNOSIS — I4891 Unspecified atrial fibrillation: Secondary | ICD-10-CM | POA: Diagnosis not present

## 2015-08-19 DIAGNOSIS — Z7984 Long term (current) use of oral hypoglycemic drugs: Secondary | ICD-10-CM | POA: Diagnosis not present

## 2015-08-19 DIAGNOSIS — E785 Hyperlipidemia, unspecified: Secondary | ICD-10-CM | POA: Diagnosis not present

## 2015-08-19 DIAGNOSIS — I10 Essential (primary) hypertension: Secondary | ICD-10-CM | POA: Diagnosis not present

## 2015-08-19 DIAGNOSIS — Z7901 Long term (current) use of anticoagulants: Secondary | ICD-10-CM | POA: Diagnosis not present

## 2015-08-19 DIAGNOSIS — I350 Nonrheumatic aortic (valve) stenosis: Secondary | ICD-10-CM | POA: Diagnosis not present

## 2015-09-02 ENCOUNTER — Other Ambulatory Visit (INDEPENDENT_AMBULATORY_CARE_PROVIDER_SITE_OTHER): Payer: Medicare Other | Admitting: *Deleted

## 2015-09-02 DIAGNOSIS — I779 Disorder of arteries and arterioles, unspecified: Secondary | ICD-10-CM | POA: Diagnosis not present

## 2015-09-02 DIAGNOSIS — Z7901 Long term (current) use of anticoagulants: Secondary | ICD-10-CM | POA: Diagnosis not present

## 2015-09-02 DIAGNOSIS — E785 Hyperlipidemia, unspecified: Secondary | ICD-10-CM

## 2015-09-02 DIAGNOSIS — E1140 Type 2 diabetes mellitus with diabetic neuropathy, unspecified: Secondary | ICD-10-CM | POA: Diagnosis not present

## 2015-09-02 DIAGNOSIS — I739 Peripheral vascular disease, unspecified: Principal | ICD-10-CM

## 2015-09-02 LAB — LIPID PANEL
CHOL/HDL RATIO: 2.6 ratio (ref <=5.0)
Cholesterol: 150 mg/dL (ref 125–200)
HDL: 58 mg/dL (ref >=46)
LDL CALC: 58 mg/dL (ref <130)
Triglycerides: 170 mg/dL — ABNORMAL HIGH (ref <150)
VLDL: 34 mg/dL — ABNORMAL HIGH (ref <30)

## 2015-09-02 LAB — ALT: ALT: 27 U/L (ref 6–29)

## 2015-09-06 ENCOUNTER — Other Ambulatory Visit: Payer: Medicare Other

## 2015-09-09 DIAGNOSIS — Z7901 Long term (current) use of anticoagulants: Secondary | ICD-10-CM | POA: Diagnosis not present

## 2015-10-09 DIAGNOSIS — Z7901 Long term (current) use of anticoagulants: Secondary | ICD-10-CM | POA: Diagnosis not present

## 2015-10-24 DIAGNOSIS — Z7901 Long term (current) use of anticoagulants: Secondary | ICD-10-CM | POA: Diagnosis not present

## 2015-11-01 DIAGNOSIS — Z7901 Long term (current) use of anticoagulants: Secondary | ICD-10-CM | POA: Diagnosis not present

## 2015-11-29 DIAGNOSIS — Z961 Presence of intraocular lens: Secondary | ICD-10-CM | POA: Diagnosis not present

## 2015-11-29 DIAGNOSIS — E119 Type 2 diabetes mellitus without complications: Secondary | ICD-10-CM | POA: Diagnosis not present

## 2015-11-29 DIAGNOSIS — H35033 Hypertensive retinopathy, bilateral: Secondary | ICD-10-CM | POA: Diagnosis not present

## 2015-11-29 DIAGNOSIS — E113291 Type 2 diabetes mellitus with mild nonproliferative diabetic retinopathy without macular edema, right eye: Secondary | ICD-10-CM | POA: Diagnosis not present

## 2015-11-29 DIAGNOSIS — H3410 Central retinal artery occlusion, unspecified eye: Secondary | ICD-10-CM | POA: Diagnosis not present

## 2015-12-03 DIAGNOSIS — E1140 Type 2 diabetes mellitus with diabetic neuropathy, unspecified: Secondary | ICD-10-CM | POA: Diagnosis not present

## 2015-12-03 DIAGNOSIS — Z23 Encounter for immunization: Secondary | ICD-10-CM | POA: Diagnosis not present

## 2015-12-03 DIAGNOSIS — Z7901 Long term (current) use of anticoagulants: Secondary | ICD-10-CM | POA: Diagnosis not present

## 2015-12-13 DIAGNOSIS — D631 Anemia in chronic kidney disease: Secondary | ICD-10-CM | POA: Diagnosis not present

## 2015-12-13 DIAGNOSIS — N183 Chronic kidney disease, stage 3 (moderate): Secondary | ICD-10-CM | POA: Diagnosis not present

## 2015-12-13 DIAGNOSIS — N2581 Secondary hyperparathyroidism of renal origin: Secondary | ICD-10-CM | POA: Diagnosis not present

## 2015-12-13 DIAGNOSIS — I129 Hypertensive chronic kidney disease with stage 1 through stage 4 chronic kidney disease, or unspecified chronic kidney disease: Secondary | ICD-10-CM | POA: Diagnosis not present

## 2015-12-13 DIAGNOSIS — E1129 Type 2 diabetes mellitus with other diabetic kidney complication: Secondary | ICD-10-CM | POA: Diagnosis not present

## 2015-12-13 DIAGNOSIS — R809 Proteinuria, unspecified: Secondary | ICD-10-CM | POA: Diagnosis not present

## 2016-01-02 DIAGNOSIS — Z7901 Long term (current) use of anticoagulants: Secondary | ICD-10-CM | POA: Diagnosis not present

## 2016-01-15 DIAGNOSIS — R05 Cough: Secondary | ICD-10-CM | POA: Diagnosis not present

## 2016-02-05 DIAGNOSIS — Z7901 Long term (current) use of anticoagulants: Secondary | ICD-10-CM | POA: Diagnosis not present

## 2016-02-18 DIAGNOSIS — E785 Hyperlipidemia, unspecified: Secondary | ICD-10-CM | POA: Diagnosis not present

## 2016-02-18 DIAGNOSIS — Z0000 Encounter for general adult medical examination without abnormal findings: Secondary | ICD-10-CM | POA: Diagnosis not present

## 2016-02-18 DIAGNOSIS — E1140 Type 2 diabetes mellitus with diabetic neuropathy, unspecified: Secondary | ICD-10-CM | POA: Diagnosis not present

## 2016-02-18 DIAGNOSIS — I10 Essential (primary) hypertension: Secondary | ICD-10-CM | POA: Diagnosis not present

## 2016-02-18 DIAGNOSIS — E039 Hypothyroidism, unspecified: Secondary | ICD-10-CM | POA: Diagnosis not present

## 2016-02-18 DIAGNOSIS — E1165 Type 2 diabetes mellitus with hyperglycemia: Secondary | ICD-10-CM | POA: Diagnosis not present

## 2016-02-18 DIAGNOSIS — Z7984 Long term (current) use of oral hypoglycemic drugs: Secondary | ICD-10-CM | POA: Diagnosis not present

## 2016-02-18 DIAGNOSIS — Z7901 Long term (current) use of anticoagulants: Secondary | ICD-10-CM | POA: Diagnosis not present

## 2016-03-20 DIAGNOSIS — Z7901 Long term (current) use of anticoagulants: Secondary | ICD-10-CM | POA: Diagnosis not present

## 2016-04-01 DIAGNOSIS — Z7901 Long term (current) use of anticoagulants: Secondary | ICD-10-CM | POA: Diagnosis not present

## 2016-04-08 DIAGNOSIS — Z7901 Long term (current) use of anticoagulants: Secondary | ICD-10-CM | POA: Diagnosis not present

## 2016-04-27 DIAGNOSIS — Z7901 Long term (current) use of anticoagulants: Secondary | ICD-10-CM | POA: Diagnosis not present

## 2016-05-25 ENCOUNTER — Other Ambulatory Visit: Payer: Self-pay | Admitting: Cardiology

## 2016-05-25 DIAGNOSIS — Z7901 Long term (current) use of anticoagulants: Secondary | ICD-10-CM | POA: Diagnosis not present

## 2016-06-01 DIAGNOSIS — Z7901 Long term (current) use of anticoagulants: Secondary | ICD-10-CM | POA: Diagnosis not present

## 2016-06-15 ENCOUNTER — Encounter: Payer: Self-pay | Admitting: Cardiology

## 2016-06-16 DIAGNOSIS — Z7901 Long term (current) use of anticoagulants: Secondary | ICD-10-CM | POA: Diagnosis not present

## 2016-06-24 ENCOUNTER — Ambulatory Visit: Payer: Medicare Other | Admitting: Cardiology

## 2016-06-26 DIAGNOSIS — Z5181 Encounter for therapeutic drug level monitoring: Secondary | ICD-10-CM | POA: Diagnosis not present

## 2016-06-26 DIAGNOSIS — Z7901 Long term (current) use of anticoagulants: Secondary | ICD-10-CM | POA: Diagnosis not present

## 2016-06-29 ENCOUNTER — Other Ambulatory Visit: Payer: Self-pay | Admitting: Cardiology

## 2016-07-01 ENCOUNTER — Encounter: Payer: Self-pay | Admitting: Cardiology

## 2016-07-01 ENCOUNTER — Ambulatory Visit (INDEPENDENT_AMBULATORY_CARE_PROVIDER_SITE_OTHER): Payer: Medicare Other | Admitting: Cardiology

## 2016-07-01 VITALS — BP 162/42 | HR 93 | Ht 66.0 in | Wt 167.0 lb

## 2016-07-01 DIAGNOSIS — I350 Nonrheumatic aortic (valve) stenosis: Secondary | ICD-10-CM

## 2016-07-01 DIAGNOSIS — I272 Pulmonary hypertension, unspecified: Secondary | ICD-10-CM

## 2016-07-01 DIAGNOSIS — I5032 Chronic diastolic (congestive) heart failure: Secondary | ICD-10-CM | POA: Diagnosis not present

## 2016-07-01 DIAGNOSIS — I481 Persistent atrial fibrillation: Secondary | ICD-10-CM | POA: Diagnosis not present

## 2016-07-01 DIAGNOSIS — I10 Essential (primary) hypertension: Secondary | ICD-10-CM | POA: Diagnosis not present

## 2016-07-01 DIAGNOSIS — I739 Peripheral vascular disease, unspecified: Secondary | ICD-10-CM

## 2016-07-01 DIAGNOSIS — I779 Disorder of arteries and arterioles, unspecified: Secondary | ICD-10-CM

## 2016-07-01 DIAGNOSIS — I4819 Other persistent atrial fibrillation: Secondary | ICD-10-CM

## 2016-07-01 HISTORY — DX: Nonrheumatic aortic (valve) stenosis: I35.0

## 2016-07-01 NOTE — Patient Instructions (Addendum)
Medication Instructions:  Your physician recommends that you continue on your current medications as directed. Please refer to the Current Medication list given to you today.   Labwork: None  Testing/Procedures: Your physician has requested that you have an echocardiogram in May, 2019. Echocardiography is a painless test that uses sound waves to create images of your heart. It provides your doctor with information about the size and shape of your heart and how well your heart's chambers and valves are working. This procedure takes approximately one hour. There are no restrictions for this procedure.  You have CAROTID DOPPLERS scheduled 07/14/2016 at 10:00AM.   Follow-Up: Your physician wants you to follow-up in: 1 year with Dr. [name]. You will receive a reminder letter in the mail two months in advance. If you don't receive a letter, please call our office to schedule the follow-up appointment.   Any Other Special Instructions Will Be Listed Below (If Applicable).     If you need a refill on your cardiac medications before your next appointment, please call your pharmacy.

## 2016-07-01 NOTE — Progress Notes (Signed)
Cardiology Office Note    Date:  07/01/2016   ID:  Jackie Alvarez, DOB [birth year], MRN [id number]  PCP:  Jonathon Bellows, MD  Cardiologist:  Fransico Him, MD   Chief Complaint  Patient presents with  . Congestive Heart Failure  . Hypertension  . Aortic Stenosis    History of Present Illness:  Jackie Alvarez is a 83 y.o. female with a history of chronic diastolic CHF, HTN, CKD stage III, remote atrial flutter and mild AS. er last echo showed mildly thickened AV with no AS. She now presents back for followup. She denies any chest pain or pressure, SOB, DOE, PND, orthopnea, palpitations, LE edema, dizziness or syncope. She does not get much aerobic activity.   Past Medical History:  Diagnosis Date  . Anemia   . Anxiety   . Aortic stenosis 07/01/2016  . Atrial flutter ([place]) 2008   remote  . Cancer [facility]) 1994   Left Breast  . Carotid artery stenosis    mild bilateral, < 40% bilateral  . Central retinal vein occlusion, right eye   . Cholelithiases   . Chronic diastolic CHF (congestive heart failure) ([place])   . Chronic kidney disease    CKD stage III  . COPD (chronic obstructive pulmonary disease) ([place])   . Depression   . Diabetes mellitus   . GERD (gastroesophageal reflux disease)   . Hiatal hernia   . Hyperlipidemia   . Hypertension   . Obesity   . Peripheral neuropathy   . Pulmonary HTN ([place])    mild PASP 71mmHg by echo 12/2012  . Schatzki's ring   . Thyroid disease    toxic multinodular goiter with history of thyrotoxicosis now s/p RAI ablative therapy - now hypothyroid  . Ulcer 2009   gatric with history of bleeding - resolved    Past Surgical History:  Procedure Laterality Date  . ABDOMINAL AORTAGRAM  Jan. 18, 2013  . ABDOMINAL AORTAGRAM N/A 03/17/2011   Procedure: ABDOMINAL Maxcine Ham;  Surgeon: Serafina Mitchell, MD;  Location: [facility] CATH LAB;  Service: Cardiovascular;  Laterality: N/A;  . ABDOMINAL HYSTERECTOMY     with BSO  . BREAST LUMPECTOMY    .  CHOLECYSTECTOMY     Gall Bladder  . HIP SURGERY Left   . PR VEIN BYPASS GRAFT,AORTO-FEM-POP    . WRIST SURGERY  02/2009    Current Medications: Current Meds  Medication Sig  . amLODipine (NORVASC) 10 MG tablet Take 10 mg by mouth daily.    Marland Kitchen atorvastatin (LIPITOR) 40 MG tablet TAKE 1 TABLET BY MOUTH DAILY  . citalopram (CELEXA) 40 MG tablet Take 40 mg by mouth daily.  . fluticasone (FLONASE) 50 MCG/ACT nasal spray Place 2 sprays into both nostrils daily.  . furosemide (LASIX) 40 MG tablet Take 2 (40mg ) tablets by mouth in AM and take 1 tablet in the afternoon  . glipiZIDE (GLUCOTROL XL) 2.5 MG 24 hr tablet Take 2.5 mg by mouth daily with breakfast.   . hydrALAZINE (APRESOLINE) 100 MG tablet Take 100 mg by mouth 3 (three) times daily.  Marland Kitchen JANUVIA 100 MG tablet Take 50 mg by mouth daily.   Marland Kitchen levothyroxine (SYNTHROID, LEVOTHROID) 88 MCG tablet Take 88 mcg by mouth daily before breakfast.  . mirtazapine (REMERON) 30 MG tablet Take 30 mg by mouth every morning.  . potassium chloride SA (K-DUR,KLOR-CON) 20 MEQ tablet Take 20 mEq by mouth daily.  Marland Kitchen warfarin (COUMADIN) 1 MG tablet Take 1 mg by mouth as  directed. Take 2 mg on Sunday, Monday, Wednesday and Friday. 1.5 mg on Tuesday and Thursday    Allergies:   Metformin and related and Omnipaque [iohexol]   Social History   Social History  . Marital status: Married    Spouse name: N/A  . Number of children: 1  . Years of education: N/A   Occupational History  . retired    Social History Main Topics  . Smoking status: Former Smoker    Packs/day: 1.00    Years: 45.00    Types: Cigarettes    Quit date: 01/26/1997  . Smokeless tobacco: Never Used  . Alcohol use No  . Drug use: No  . Sexual activity: No   Other Topics Concern  . None   Social History Narrative   Son lives with her in her house.      Family History:  The patient's family history includes Cancer in her father, mother, and sister; Cirrhosis in her father; Diabetes  in her sister.   ROS:   Please see the history of present illness.    ROS All other systems reviewed and are negative.  No flowsheet data found.     PHYSICAL EXAM:   VS:  BP (!) 162/42   Pulse 93   Ht 5\' 6"  (1.676 m)   Wt 167 lb (75.8 kg)   BMI 26.95 kg/m    GEN: Well nourished, well developed, in no acute distress  HEENT: normal  Neck: no JVD, carotid bruits, or masses Cardiac: RRR; no rubs, or gallops,no edema.  Intact distal pulses bilaterally. 1/6 SM at RUSB to LLSB Respiratory:  clear to auscultation bilaterally, normal work of breathing GI: soft, nontender, nondistended, + BS MS: no deformity or atrophy  Skin: warm and dry, diffuse scaly rash over the LE with minimal edema.  Neuro:  Alert and Oriented x 3, Strength and sensation are intact Psych: euthymic mood, full affect  Wt Readings from Last 3 Encounters:  07/01/16 167 lb (75.8 kg)  07/02/15 144 lb (65.3 kg)  06/25/15 149 lb 6.4 oz (67.8 kg)      Studies/Labs Reviewed:   EKG:  EKG is ordered today.  The ekg ordered today demonstrates NSR at 93bpm with nonspecific ST abnormality  Recent Labs: 09/02/2015: ALT 27   Lipid Panel    Component Value Date/Time   CHOL 150 09/02/2015 1110   TRIG 170 (H) 09/02/2015 1110   HDL 58 09/02/2015 1110   CHOLHDL 2.6 09/02/2015 1110   VLDL 34 (H) 09/02/2015 1110   LDLCALC 58 09/02/2015 1110    Additional studies/ records that were reviewed today include:  none    ASSESSMENT:    1. Chronic diastolic heart failure (HCC)   2. Persistent atrial fibrillation ([place])   3. Essential hypertension   4. Pulmonary HTN ([place])   5. Bilateral carotid artery disease ([place])   6. Aortic valve stenosis, etiology of cardiac valve disease unspecified      PLAN:  In order of problems listed above:  1. Chronic diastolic CHF - she appears euvolemic on exam. Her weight is back up after having PNA and losing a lot of weight.  She has no SOB, PND or orthopnea and no LE edema. She will  continue on diuretic.  I will get a copy of BMET from nephrology.  2. Persistent atrial fibrillation - she is maintaining NSR.  She will continue on warfarin. 3. HTN - her BP is adequately controlled today. She will continue on amlodipine and  hydralazine. 4. Pulmonary HTN - no evidence of pulmonary HTN on echo 07/2015.   5. Bilateral carotid artery disease (<40% bilaterally) - I will repeat dopplers.  She will continue on statin.  She is not on ASA due to warfarin.  6. Mild Aortic stenosis - repeat echo in 07/2017 for progression.  She is asymptomatic.     Medication Adjustments/Labs and Tests Ordered: Current medicines are reviewed at length with the patient today.  Concerns regarding medicines are outlined above.  Medication changes, Labs and Tests ordered today are listed in the Patient Instructions below.  There are no Patient Instructions on file for this visit.   Signed, Fransico Him, MD  07/01/2016 10:33 AM    [facility] [address], [place], [place]  [address] Phone: [phone]; Fax: [phone]

## 2016-07-02 ENCOUNTER — Encounter: Payer: Self-pay | Admitting: Family

## 2016-07-03 DIAGNOSIS — R809 Proteinuria, unspecified: Secondary | ICD-10-CM | POA: Diagnosis not present

## 2016-07-03 DIAGNOSIS — D631 Anemia in chronic kidney disease: Secondary | ICD-10-CM | POA: Diagnosis not present

## 2016-07-03 DIAGNOSIS — E1129 Type 2 diabetes mellitus with other diabetic kidney complication: Secondary | ICD-10-CM | POA: Diagnosis not present

## 2016-07-03 DIAGNOSIS — I129 Hypertensive chronic kidney disease with stage 1 through stage 4 chronic kidney disease, or unspecified chronic kidney disease: Secondary | ICD-10-CM | POA: Diagnosis not present

## 2016-07-03 DIAGNOSIS — N183 Chronic kidney disease, stage 3 (moderate): Secondary | ICD-10-CM | POA: Diagnosis not present

## 2016-07-03 DIAGNOSIS — N2581 Secondary hyperparathyroidism of renal origin: Secondary | ICD-10-CM | POA: Diagnosis not present

## 2016-07-03 DIAGNOSIS — Z7901 Long term (current) use of anticoagulants: Secondary | ICD-10-CM | POA: Diagnosis not present

## 2016-07-08 DIAGNOSIS — D631 Anemia in chronic kidney disease: Secondary | ICD-10-CM | POA: Diagnosis not present

## 2016-07-08 DIAGNOSIS — L308 Other specified dermatitis: Secondary | ICD-10-CM | POA: Diagnosis not present

## 2016-07-08 DIAGNOSIS — N183 Chronic kidney disease, stage 3 (moderate): Secondary | ICD-10-CM | POA: Diagnosis not present

## 2016-07-09 ENCOUNTER — Other Ambulatory Visit: Payer: Self-pay | Admitting: *Deleted

## 2016-07-09 MED ORDER — ATORVASTATIN CALCIUM 40 MG PO TABS: 40.0000 mg | ORAL_TABLET | Freq: Every day | ORAL | 3 refills | Status: DC

## 2016-07-10 DIAGNOSIS — Z7901 Long term (current) use of anticoagulants: Secondary | ICD-10-CM | POA: Diagnosis not present

## 2016-07-14 ENCOUNTER — Ambulatory Visit (INDEPENDENT_AMBULATORY_CARE_PROVIDER_SITE_OTHER): Payer: Medicare Other | Admitting: Family

## 2016-07-14 ENCOUNTER — Telehealth (HOSPITAL_COMMUNITY): Payer: Self-pay | Admitting: Cardiology

## 2016-07-14 ENCOUNTER — Encounter: Payer: Self-pay | Admitting: Family

## 2016-07-14 ENCOUNTER — Ambulatory Visit (HOSPITAL_COMMUNITY)
Admission: RE | Admit: 2016-07-14 | Discharge: 2016-07-14 | Disposition: A | Discharge status: Home / Self Care | Payer: Medicare Other | Source: Ambulatory Visit | Attending: Family | Admitting: Family

## 2016-07-14 ENCOUNTER — Ambulatory Visit (INDEPENDENT_AMBULATORY_CARE_PROVIDER_SITE_OTHER)
Admission: RE | Admit: 2016-07-14 | Discharge: 2016-07-14 | Disposition: A | Discharge status: Home / Self Care | Payer: Medicare Other | Source: Ambulatory Visit | Attending: Family | Admitting: Family

## 2016-07-14 ENCOUNTER — Other Ambulatory Visit: Payer: Self-pay

## 2016-07-14 VITALS — BP 135/54 | HR 56 | Temp 97.0°F | Resp 16 | Ht 66.0 in | Wt 165.0 lb

## 2016-07-14 DIAGNOSIS — Z87891 Personal history of nicotine dependence: Secondary | ICD-10-CM

## 2016-07-14 DIAGNOSIS — I6523 Occlusion and stenosis of bilateral carotid arteries: Secondary | ICD-10-CM | POA: Insufficient documentation

## 2016-07-14 DIAGNOSIS — Z48812 Encounter for surgical aftercare following surgery on the circulatory system: Secondary | ICD-10-CM

## 2016-07-14 DIAGNOSIS — Z95828 Presence of other vascular implants and grafts: Secondary | ICD-10-CM

## 2016-07-14 DIAGNOSIS — I739 Peripheral vascular disease, unspecified: Secondary | ICD-10-CM | POA: Insufficient documentation

## 2016-07-14 DIAGNOSIS — I350 Nonrheumatic aortic (valve) stenosis: Secondary | ICD-10-CM

## 2016-07-14 DIAGNOSIS — Z4889 Encounter for other specified surgical aftercare: Secondary | ICD-10-CM

## 2016-07-14 DIAGNOSIS — I272 Pulmonary hypertension, unspecified: Secondary | ICD-10-CM

## 2016-07-14 DIAGNOSIS — I5032 Chronic diastolic (congestive) heart failure: Secondary | ICD-10-CM

## 2016-07-14 NOTE — Patient Instructions (Signed)
Stroke Prevention Some medical conditions and behaviors are associated with an increased chance of having a stroke. You may prevent a stroke by making healthy choices and managing medical conditions. How can I reduce my risk of having a stroke?  Stay physically active. Get at least 30 minutes of activity on most or all days.  Do not smoke. It may also be helpful to avoid exposure to secondhand smoke.  Limit alcohol use. Moderate alcohol use is considered to be:  No more than 2 drinks per day for men.  No more than 1 drink per day for nonpregnant women.  Eat healthy foods. This involves:  Eating 5 or more servings of fruits and vegetables a day.  Making dietary changes that address high blood pressure (hypertension), high cholesterol, diabetes, or obesity.  Manage your cholesterol levels.  Making food choices that are high in fiber and low in saturated fat, trans fat, and cholesterol may control cholesterol levels.  Take any prescribed medicines to control cholesterol as directed by your health care provider.  Manage your diabetes.  Controlling your carbohydrate and sugar intake is recommended to manage diabetes.  Take any prescribed medicines to control diabetes as directed by your health care provider.  Control your hypertension.  Making food choices that are low in salt (sodium), saturated fat, trans fat, and cholesterol is recommended to manage hypertension.  Ask your health care provider if you need treatment to lower your blood pressure. Take any prescribed medicines to control hypertension as directed by your health care provider.  If you are 18-39 years of age, have your blood pressure checked every 3-5 years. If you are 40 years of age or older, have your blood pressure checked every year.  Maintain a healthy weight.  Reducing calorie intake and making food choices that are low in sodium, saturated fat, trans fat, and cholesterol are recommended to manage  weight.  Stop drug abuse.  Avoid taking birth control pills.  Talk to your health care provider about the risks of taking birth control pills if you are over 35 years old, smoke, get migraines, or have ever had a blood clot.  Get evaluated for sleep disorders (sleep apnea).  Talk to your health care provider about getting a sleep evaluation if you snore a lot or have excessive sleepiness.  Take medicines only as directed by your health care provider.  For some people, aspirin or blood thinners (anticoagulants) are helpful in reducing the risk of forming abnormal blood clots that can lead to stroke. If you have the irregular heart rhythm of atrial fibrillation, you should be on a blood thinner unless there is a good reason you cannot take them.  Understand all your medicine instructions.  Make sure that other conditions (such as anemia or atherosclerosis) are addressed. Get help right away if:  You have sudden weakness or numbness of the face, arm, or leg, especially on one side of the body.  Your face or eyelid droops to one side.  You have sudden confusion.  You have trouble speaking (aphasia) or understanding.  You have sudden trouble seeing in one or both eyes.  You have sudden trouble walking.  You have dizziness.  You have a loss of balance or coordination.  You have a sudden, severe headache with no known cause.  You have new chest pain or an irregular heartbeat. Any of these symptoms may represent a serious problem that is an emergency. Do not wait to see if the symptoms will go away.   Get medical help at once. Call your local emergency services (911 in [place] Do not drive yourself to the hospital.  This information is not intended to replace advice given to you by your health care provider. Make sure you discuss any questions you have with your health care provider. Document Released: 04/02/2004 Document Revised: 08/01/2015 Document Reviewed: 08/26/2012 Elsevier  Interactive Patient Education  2017 Elsevier Inc.      Peripheral Vascular Disease Peripheral vascular disease (PVD) is a disease of the blood vessels that are not part of your heart and brain. A simple term for PVD is poor circulation. In most cases, PVD narrows the blood vessels that carry blood from your heart to the rest of your body. This can result in a decreased supply of blood to your arms, legs, and internal organs, like your stomach or kidneys. However, it most often affects a person's lower legs and feet. There are two types of PVD.  Organic PVD. This is the more common type. It is caused by damage to the structure of blood vessels.  Functional PVD. This is caused by conditions that make blood vessels contract and tighten (spasm). Without treatment, PVD tends to get worse over time. PVD can also lead to acute ischemic limb. This is when an arm or limb suddenly has trouble getting enough blood. This is a medical emergency. Follow these instructions at home:  Take medicines only as told by your doctor.  Do not use any tobacco products, including cigarettes, chewing tobacco, or electronic cigarettes. If you need help quitting, ask your doctor.  Lose weight if you are overweight, and maintain a healthy weight as told by your doctor.  Eat a diet that is low in fat and cholesterol. If you need help, ask your doctor.  Exercise regularly. Ask your doctor for some good activities for you.  Take good care of your feet.  Wear comfortable shoes that fit well.  Check your feet often for any cuts or sores. Contact a doctor if:  You have cramps in your legs while walking.  You have leg pain when you are at rest.  You have coldness in a leg or foot.  Your skin changes.  You are unable to get or have an erection (erectile dysfunction).  You have cuts or sores on your feet that are not healing. Get help right away if:  Your arm or leg turns cold and blue.  Your arms or legs  become red, warm, swollen, painful, or numb.  You have chest pain or trouble breathing.  You suddenly have weakness in your face, arm, or leg.  You become very confused or you cannot speak.  You suddenly have a very bad headache.  You suddenly cannot see. This information is not intended to replace advice given to you by your health care provider. Make sure you discuss any questions you have with your health care provider. Document Released: 05/20/2009 Document Revised: 08/01/2015 Document Reviewed: 08/03/2013 Elsevier Interactive Patient Education  2017 Elsevier Inc.  

## 2016-07-14 NOTE — Progress Notes (Signed)
[facility] [place] HISTORY AND PHYSICAL   MRN : [id number]  History of Present Illness:   Jackie Alvarez is a 83 y.o. female patient of Dr. Kellie Simmering followed for known extracranial carotid artery stenosis as well as a past vascular history of multiple surgeries including an aortobifem bypass graft in 1998 and 2001 a left profunda femoris to peroneal bypass graft using lesser saphenous vein with multiple revisions of the distal anastomosis.  She returns today for follow up.  She reports both legs feeling weak after walking about 125 feet to her mailbox, improved with rest.    She denies any history of TIA or stroke symptom. Specifically she denies a history of amaurosis fugax or monocular blindness, unilateral facial drooping, hemiplegia, or receptive or expressive aphasia.   She had pneumonia October 2015, was treated as an out patient, lost her appetite and lost 32 pounds; since this weight loss she has had no more claudication. She denies post prandial abdominal pain, denies any abdominal or back pain.   She weighed 144# on 07-02-15, states she feels better with more weight on.   Pt denies steal symptoms in either arm.  She states she feels well, no longer has DM.  She states the rash on both lower legs itches, has been prescribed a cream to treat this.   She sees Dr. Mercy Moore for CRF, he added carvedilol.   Pt Diabetic: no longer has DM since she lost so much weight Pt smoker: former smoker, quit in 1994   Pt meds include:  Statin : Yes  ASA: No  Other anticoagulants/antiplatelets: coumadin, possibly for atrial flutter, was started Oct., 2014, per pt., her heart rate was in the 30's, does not have a pacemaker.   Current Outpatient Prescriptions  Medication Sig Dispense Refill  . amLODipine (NORVASC) 10 MG tablet Take 10 mg by mouth daily.      Marland Kitchen atorvastatin (LIPITOR) 40 MG tablet Take 1 tablet (40 mg total) by mouth daily. 90 tablet 3  .  betamethasone dipropionate (DIPROLENE) 0.05 % cream     . citalopram (CELEXA) 40 MG tablet Take 40 mg by mouth daily.    . cloNIDine (CATAPRES) 0.1 MG tablet     . fluticasone (FLONASE) 50 MCG/ACT nasal spray Place 2 sprays into both nostrils daily.    . furosemide (LASIX) 40 MG tablet Take 2 (40mg ) tablets by mouth in AM and take 1 tablet in the afternoon    . glipiZIDE (GLUCOTROL XL) 2.5 MG 24 hr tablet Take 2.5 mg by mouth daily with breakfast.     . hydrALAZINE (APRESOLINE) 100 MG tablet Take 100 mg by mouth 3 (three) times daily.    Marland Kitchen JANUVIA 100 MG tablet Take 50 mg by mouth daily.     Marland Kitchen levothyroxine (SYNTHROID, LEVOTHROID) 88 MCG tablet Take 88 mcg by mouth daily before breakfast.    . mirtazapine (REMERON) 30 MG tablet Take 30 mg by mouth every morning.    . potassium chloride SA (K-DUR,KLOR-CON) 20 MEQ tablet Take 20 mEq by mouth daily.    Marland Kitchen warfarin (COUMADIN) 1 MG tablet Take 1 mg by mouth as directed. Take 2 mg on Sunday, Monday, Wednesday and Friday. 1.5 mg on Tuesday and Thursday     No current facility-administered medications for this visit.     Past Medical History:  Diagnosis Date  . Anemia   . Anxiety   . Aortic stenosis 07/01/2016  . Atrial flutter ([place]) 2008   remote  .  Cancer [facility] 1994   Left Breast  . Carotid artery stenosis    mild bilateral, < 40% bilateral  . Central retinal vein occlusion, right eye   . Cholelithiases   . Chronic diastolic CHF (congestive heart failure) ([place])   . Chronic kidney disease    CKD stage III  . COPD (chronic obstructive pulmonary disease) ([place])   . Depression   . Diabetes mellitus   . GERD (gastroesophageal reflux disease)   . Hiatal hernia   . Hyperlipidemia   . Hypertension   . Obesity   . Peripheral neuropathy   . Pulmonary HTN ([place])    mild PASP 73mmHg by echo 12/2012  . Schatzki's ring   . Thyroid disease    toxic multinodular goiter with history of thyrotoxicosis now s/p RAI ablative therapy - now hypothyroid   . Ulcer 2009   gatric with history of bleeding - resolved    Social History Social History  Substance Use Topics  . Smoking status: Former Smoker    Packs/day: 1.00    Years: 45.00    Types: Cigarettes    Quit date: 01/26/1997  . Smokeless tobacco: Never Used  . Alcohol use No    Family History Family History  Problem Relation Age of Onset  . Cancer Mother     stomach  . Cancer Father     liver  . Cirrhosis Father   . Cancer Sister   . Diabetes Sister     Surgical History Past Surgical History:  Procedure Laterality Date  . ABDOMINAL AORTAGRAM  Jan. 18, 2013  . ABDOMINAL AORTAGRAM N/A 03/17/2011   Procedure: ABDOMINAL Maxcine Ham;  Surgeon: Serafina Mitchell, MD;  Location: [facility] CATH LAB;  Service: Cardiovascular;  Laterality: N/A;  . ABDOMINAL HYSTERECTOMY     with BSO  . BREAST LUMPECTOMY    . CHOLECYSTECTOMY     Gall Bladder  . HIP SURGERY Left   . PR VEIN BYPASS GRAFT,AORTO-FEM-POP    . WRIST SURGERY  02/2009    Allergies  Allergen Reactions  . Metformin And Related Other (See Comments)    Renal insuff  . Omnipaque [Iohexol] Nausea And Vomiting    Current Outpatient Prescriptions  Medication Sig Dispense Refill  . amLODipine (NORVASC) 10 MG tablet Take 10 mg by mouth daily.      Marland Kitchen atorvastatin (LIPITOR) 40 MG tablet Take 1 tablet (40 mg total) by mouth daily. 90 tablet 3  . betamethasone dipropionate (DIPROLENE) 0.05 % cream     . citalopram (CELEXA) 40 MG tablet Take 40 mg by mouth daily.    . cloNIDine (CATAPRES) 0.1 MG tablet     . fluticasone (FLONASE) 50 MCG/ACT nasal spray Place 2 sprays into both nostrils daily.    . furosemide (LASIX) 40 MG tablet Take 2 (40mg ) tablets by mouth in AM and take 1 tablet in the afternoon    . glipiZIDE (GLUCOTROL XL) 2.5 MG 24 hr tablet Take 2.5 mg by mouth daily with breakfast.     . hydrALAZINE (APRESOLINE) 100 MG tablet Take 100 mg by mouth 3 (three) times daily.    Marland Kitchen JANUVIA 100 MG tablet Take 50 mg by mouth daily.      Marland Kitchen levothyroxine (SYNTHROID, LEVOTHROID) 88 MCG tablet Take 88 mcg by mouth daily before breakfast.    . mirtazapine (REMERON) 30 MG tablet Take 30 mg by mouth every morning.    . potassium chloride SA (K-DUR,KLOR-CON) 20 MEQ tablet Take 20 mEq by mouth daily.    Marland Kitchen  warfarin (COUMADIN) 1 MG tablet Take 1 mg by mouth as directed. Take 2 mg on Sunday, Monday, Wednesday and Friday. 1.5 mg on Tuesday and Thursday     No current facility-administered medications for this visit.      REVIEW OF SYSTEMS: See HPI for pertinent positives and negatives.  Physical Examination Vitals:   07/14/16 1122 07/14/16 1125  BP: (!) 154/51 (!) 135/54  Pulse: (!) 54 (!) 56  Resp: 16   Temp: 97 F (36.1 C)   TempSrc: Oral   SpO2: 99%   Weight: 165 lb (74.8 kg)   Height: 5\' 6"  (1.676 m)    Body mass index is 26.63 kg/m.  General:  WDWN female in NAD  GAIT: normal Eyes: PERRLA  Pulmonary: Respirations are non labored, CTAB Cardiac: Irregular rhythm, bradycardic, + murmur.   VASCULAR EXAM  Carotid Bruits  Left  Right    Negative  Transmitted cardiac murmur  Aorta is palpable.  Radial pulses are 1+ right, 2+ left palpable.   LE Pulses  LEFT  RIGHT   FEMORAL  palpable  palpable   POPLITEAL  not palpable  not palpable   POSTERIOR TIBIAL  not palpable  not palpable   DORSALIS PEDIS  ANTERIOR TIBIAL  1+ palpable  not palpable    Gastrointestinal: soft, nontender, BS WNL, no r/g, no palpable masses Musculoskeletal: No muscle atrophy/wasting. M/S 4/5throughout, Extremities without ischemic changes. No peripheral edema.  Skin: Mildly red macular dry pruritic rash on anterior aspects of both lower legs, with mild flaking of skin.  Neurologic: A&O X 3; Appropriate Affect, Speech is normal  CN 2-12 intact, Pain and light touch intact in extremities, Motor exam as listed above    ASSESSMENT:  Aradhana L Mervine is a 83 y.o. female with known  extracranial carotid artery stenosis as well as a past vascular history of multiple surgeries including an aortobifem bypass graft in 1998 and 2001 a left profunda femoris to peroneal bypass graft using lesser saphenous vein with multiple revisions of the distal anastomosis.  She no longer has lower extremity claudication since she lost 32 pounds after a long bout of pneumonia.  She has no signs of ischemia in her feet/legs.  She has no history of stroke or TIA. She feels very well.  Her atherosclerotic risk factors include former diabetic, former smoker, atrial flutter, CKD stage 3, and COPD. She takes a statin and coumadin.    DATA Today's carotid Duplex suggests 40-59% stenosis of the right ICA and <40% stenosis of the left ICA.  Bilateral vertebral artery flow is antegrade.  Bilateral subclavian artery waveforms are normal.  No significant change since exam of 07-02-15.  Today's bilateral LE arterial duplex demonstrates 232 cm/s velocity at the proximal anastomosis of the bypass graft (was 179 cm/s on 07-02-15), and 292 cm/s at the proximal graft (was 181 cm/s). Biphasic waveform at inflow artery, all monophasic waveforms distal to inflow.  Increased stenosis of the proximal anastomosis and proximal graft compared to the previous exam on 07-02-15.   Bilateral ABI's today indicate non-compressible vessels (medial calcification), bilateral waveforms are brisk monophasic. Right TBI is 0.37 (was 0.29 on 07-02-15), left TBI is 0.56 (was 0.61).  07-26-14 abdominal duplex to evaluate prominent abdominal pulse: normal diameters of abdominal aorta and bilateral common iliac arteries.    PLAN:  Based on the patient's vascular studies and examination, pt will return to clinic in 1 year with carotid duplex, ABI's, and left LE arterial duplex. I advised pt  to notify us should she develop concerns re the circulation in her legs.   I discussed in depth with the patient the nature of  atherosclerosis, and emphasized the importance of maximal medical management including strict control of blood pressure, blood glucose, and lipid levels, obtaining regular exercise, and cessation of smoking.  The patient is aware that without maximal medical management the underlying atherosclerotic disease process will progress, limiting the benefit of any interventions.  The patient was given information about stroke prevention and what symptoms should prompt the patient to seek immediate medical care.  The patient was given information about PAD including signs, symptoms, treatment, what symptoms should prompt the patient to seek immediate medical care, and risk reduction measures to take. Thank you for allowing [place] to participate in this patient's care.  Clemon Chambers, RN, MSN, FNP-C [facility] Office: [phone]  Clinic MD: Early 07/14/2016 11:53 AM

## 2016-07-15 ENCOUNTER — Other Ambulatory Visit (HOSPITAL_COMMUNITY): Payer: Medicare Other

## 2016-07-15 DIAGNOSIS — H838X3 Other specified diseases of inner ear, bilateral: Secondary | ICD-10-CM | POA: Diagnosis not present

## 2016-07-15 DIAGNOSIS — H7201 Central perforation of tympanic membrane, right ear: Secondary | ICD-10-CM | POA: Diagnosis not present

## 2016-07-15 DIAGNOSIS — H6123 Impacted cerumen, bilateral: Secondary | ICD-10-CM | POA: Diagnosis not present

## 2016-07-15 DIAGNOSIS — H6981 Other specified disorders of Eustachian tube, right ear: Secondary | ICD-10-CM | POA: Diagnosis not present

## 2016-07-15 DIAGNOSIS — H903 Sensorineural hearing loss, bilateral: Secondary | ICD-10-CM | POA: Diagnosis not present

## 2016-07-15 NOTE — Telephone Encounter (Signed)
  07/14/2016 10:33 AM Phone (Outgoing) [place]ella [place], Jackie Alvarez (Self) [phone] (H)   Left Message - Called pt and lmsg for her to CB to get scheduled for echo needed in 07/2017    By Verdene Rio

## 2016-07-20 ENCOUNTER — Other Ambulatory Visit (HOSPITAL_COMMUNITY): Payer: Self-pay | Admitting: *Deleted

## 2016-07-20 NOTE — Addendum Note (Signed)
Addended by: Lianne Cure A on: 07/20/2016 10:31 AM   Modules accepted: Orders

## 2016-07-21 ENCOUNTER — Ambulatory Visit (HOSPITAL_COMMUNITY)
Admission: RE | Admit: 2016-07-21 | Discharge: 2016-07-21 | Disposition: A | Discharge status: Home / Self Care | Payer: Medicare Other | Source: Ambulatory Visit | Attending: Nephrology | Admitting: Nephrology

## 2016-07-21 DIAGNOSIS — D631 Anemia in chronic kidney disease: Secondary | ICD-10-CM | POA: Insufficient documentation

## 2016-07-21 DIAGNOSIS — N189 Chronic kidney disease, unspecified: Secondary | ICD-10-CM | POA: Insufficient documentation

## 2016-07-21 MED ORDER — SODIUM CHLORIDE 0.9 % IV SOLN
510.0000 mg | INTRAVENOUS | Status: DC
  Administered 2016-07-21: 510 mg via INTRAVENOUS
  Filled 2016-07-21: qty 17

## 2016-07-21 NOTE — Discharge Instructions (Signed)

## 2016-07-24 DIAGNOSIS — Z7901 Long term (current) use of anticoagulants: Secondary | ICD-10-CM | POA: Diagnosis not present

## 2016-07-28 ENCOUNTER — Ambulatory Visit (HOSPITAL_COMMUNITY)
Admission: RE | Admit: 2016-07-28 | Discharge: 2016-07-28 | Disposition: A | Discharge status: Home / Self Care | Payer: Medicare Other | Source: Ambulatory Visit | Attending: Nephrology | Admitting: Nephrology

## 2016-07-28 DIAGNOSIS — N189 Chronic kidney disease, unspecified: Secondary | ICD-10-CM | POA: Insufficient documentation

## 2016-07-28 DIAGNOSIS — D631 Anemia in chronic kidney disease: Secondary | ICD-10-CM | POA: Insufficient documentation

## 2016-07-28 MED ORDER — SODIUM CHLORIDE 0.9 % IV SOLN
510.0000 mg | INTRAVENOUS | Status: DC
  Administered 2016-07-28: 510 mg via INTRAVENOUS
  Filled 2016-07-28: qty 17

## 2016-08-18 DIAGNOSIS — E785 Hyperlipidemia, unspecified: Secondary | ICD-10-CM | POA: Diagnosis not present

## 2016-08-18 DIAGNOSIS — E039 Hypothyroidism, unspecified: Secondary | ICD-10-CM | POA: Diagnosis not present

## 2016-08-18 DIAGNOSIS — E559 Vitamin D deficiency, unspecified: Secondary | ICD-10-CM | POA: Diagnosis not present

## 2016-08-18 DIAGNOSIS — Z7901 Long term (current) use of anticoagulants: Secondary | ICD-10-CM | POA: Diagnosis not present

## 2016-08-18 DIAGNOSIS — E1140 Type 2 diabetes mellitus with diabetic neuropathy, unspecified: Secondary | ICD-10-CM | POA: Diagnosis not present

## 2016-08-18 DIAGNOSIS — I10 Essential (primary) hypertension: Secondary | ICD-10-CM | POA: Diagnosis not present

## 2016-08-19 DIAGNOSIS — Z1231 Encounter for screening mammogram for malignant neoplasm of breast: Secondary | ICD-10-CM | POA: Diagnosis not present

## 2016-08-19 DIAGNOSIS — Z853 Personal history of malignant neoplasm of breast: Secondary | ICD-10-CM | POA: Diagnosis not present

## 2016-09-18 DIAGNOSIS — Z7901 Long term (current) use of anticoagulants: Secondary | ICD-10-CM | POA: Diagnosis not present

## 2016-10-16 DIAGNOSIS — Z7901 Long term (current) use of anticoagulants: Secondary | ICD-10-CM | POA: Diagnosis not present

## 2016-10-30 DIAGNOSIS — Z7901 Long term (current) use of anticoagulants: Secondary | ICD-10-CM | POA: Diagnosis not present

## 2016-11-13 DIAGNOSIS — L89152 Pressure ulcer of sacral region, stage 2: Secondary | ICD-10-CM | POA: Diagnosis not present

## 2016-11-13 DIAGNOSIS — Z23 Encounter for immunization: Secondary | ICD-10-CM | POA: Diagnosis not present

## 2016-11-30 DIAGNOSIS — Z7901 Long term (current) use of anticoagulants: Secondary | ICD-10-CM | POA: Diagnosis not present

## 2016-12-04 DIAGNOSIS — H26491 Other secondary cataract, right eye: Secondary | ICD-10-CM | POA: Diagnosis not present

## 2016-12-04 DIAGNOSIS — H3410 Central retinal artery occlusion, unspecified eye: Secondary | ICD-10-CM | POA: Diagnosis not present

## 2016-12-04 DIAGNOSIS — H35372 Puckering of macula, left eye: Secondary | ICD-10-CM | POA: Diagnosis not present

## 2016-12-04 DIAGNOSIS — E113291 Type 2 diabetes mellitus with mild nonproliferative diabetic retinopathy without macular edema, right eye: Secondary | ICD-10-CM | POA: Diagnosis not present

## 2016-12-04 DIAGNOSIS — H35033 Hypertensive retinopathy, bilateral: Secondary | ICD-10-CM | POA: Diagnosis not present

## 2016-12-30 DIAGNOSIS — L989 Disorder of the skin and subcutaneous tissue, unspecified: Secondary | ICD-10-CM | POA: Diagnosis not present

## 2016-12-30 DIAGNOSIS — Z7901 Long term (current) use of anticoagulants: Secondary | ICD-10-CM | POA: Diagnosis not present

## 2017-01-13 DIAGNOSIS — Z7901 Long term (current) use of anticoagulants: Secondary | ICD-10-CM | POA: Diagnosis not present

## 2017-01-20 DIAGNOSIS — L8990 Pressure ulcer of unspecified site, unspecified stage: Secondary | ICD-10-CM | POA: Diagnosis not present

## 2017-01-20 DIAGNOSIS — L308 Other specified dermatitis: Secondary | ICD-10-CM | POA: Diagnosis not present

## 2017-01-27 DIAGNOSIS — Z7901 Long term (current) use of anticoagulants: Secondary | ICD-10-CM | POA: Diagnosis not present

## 2017-02-03 DIAGNOSIS — Z7901 Long term (current) use of anticoagulants: Secondary | ICD-10-CM | POA: Diagnosis not present

## 2017-02-09 DIAGNOSIS — L8990 Pressure ulcer of unspecified site, unspecified stage: Secondary | ICD-10-CM | POA: Diagnosis not present

## 2017-03-03 DIAGNOSIS — I10 Essential (primary) hypertension: Secondary | ICD-10-CM | POA: Diagnosis not present

## 2017-03-03 DIAGNOSIS — E1140 Type 2 diabetes mellitus with diabetic neuropathy, unspecified: Secondary | ICD-10-CM | POA: Diagnosis not present

## 2017-03-03 DIAGNOSIS — E785 Hyperlipidemia, unspecified: Secondary | ICD-10-CM | POA: Diagnosis not present

## 2017-03-03 DIAGNOSIS — Z7901 Long term (current) use of anticoagulants: Secondary | ICD-10-CM | POA: Diagnosis not present

## 2017-03-03 DIAGNOSIS — Z7984 Long term (current) use of oral hypoglycemic drugs: Secondary | ICD-10-CM | POA: Diagnosis not present

## 2017-03-03 DIAGNOSIS — E039 Hypothyroidism, unspecified: Secondary | ICD-10-CM | POA: Diagnosis not present

## 2017-03-03 DIAGNOSIS — E559 Vitamin D deficiency, unspecified: Secondary | ICD-10-CM | POA: Diagnosis not present

## 2017-03-03 DIAGNOSIS — Z0000 Encounter for general adult medical examination without abnormal findings: Secondary | ICD-10-CM | POA: Diagnosis not present

## 2017-03-16 DIAGNOSIS — L8990 Pressure ulcer of unspecified site, unspecified stage: Secondary | ICD-10-CM | POA: Diagnosis not present

## 2017-03-18 ENCOUNTER — Other Ambulatory Visit: Payer: Self-pay

## 2017-03-18 ENCOUNTER — Encounter (HOSPITAL_COMMUNITY): Payer: Self-pay | Admitting: Emergency Medicine

## 2017-03-18 ENCOUNTER — Emergency Department (HOSPITAL_COMMUNITY): Payer: Medicare Other

## 2017-03-18 ENCOUNTER — Emergency Department (HOSPITAL_COMMUNITY)
Admission: EM | Admit: 2017-03-18 | Discharge: 2017-03-18 | Disposition: A | Discharge status: Home / Self Care | Payer: Medicare Other | Attending: Emergency Medicine | Admitting: Emergency Medicine

## 2017-03-18 DIAGNOSIS — I5032 Chronic diastolic (congestive) heart failure: Secondary | ICD-10-CM | POA: Diagnosis not present

## 2017-03-18 DIAGNOSIS — W0110XA Fall on same level from slipping, tripping and stumbling with subsequent striking against unspecified object, initial encounter: Secondary | ICD-10-CM | POA: Diagnosis not present

## 2017-03-18 DIAGNOSIS — M79622 Pain in left upper arm: Secondary | ICD-10-CM | POA: Diagnosis not present

## 2017-03-18 DIAGNOSIS — Z79899 Other long term (current) drug therapy: Secondary | ICD-10-CM | POA: Diagnosis not present

## 2017-03-18 DIAGNOSIS — Y929 Unspecified place or not applicable: Secondary | ICD-10-CM | POA: Insufficient documentation

## 2017-03-18 DIAGNOSIS — Z87891 Personal history of nicotine dependence: Secondary | ICD-10-CM | POA: Diagnosis not present

## 2017-03-18 DIAGNOSIS — Z7984 Long term (current) use of oral hypoglycemic drugs: Secondary | ICD-10-CM | POA: Diagnosis not present

## 2017-03-18 DIAGNOSIS — I130 Hypertensive heart and chronic kidney disease with heart failure and stage 1 through stage 4 chronic kidney disease, or unspecified chronic kidney disease: Secondary | ICD-10-CM | POA: Diagnosis not present

## 2017-03-18 DIAGNOSIS — S4992XA Unspecified injury of left shoulder and upper arm, initial encounter: Secondary | ICD-10-CM | POA: Diagnosis not present

## 2017-03-18 DIAGNOSIS — E1122 Type 2 diabetes mellitus with diabetic chronic kidney disease: Secondary | ICD-10-CM | POA: Insufficient documentation

## 2017-03-18 DIAGNOSIS — J449 Chronic obstructive pulmonary disease, unspecified: Secondary | ICD-10-CM | POA: Insufficient documentation

## 2017-03-18 DIAGNOSIS — S0990XA Unspecified injury of head, initial encounter: Secondary | ICD-10-CM | POA: Diagnosis not present

## 2017-03-18 DIAGNOSIS — N183 Chronic kidney disease, stage 3 (moderate): Secondary | ICD-10-CM | POA: Diagnosis not present

## 2017-03-18 DIAGNOSIS — Y999 Unspecified external cause status: Secondary | ICD-10-CM | POA: Diagnosis not present

## 2017-03-18 DIAGNOSIS — S0083XA Contusion of other part of head, initial encounter: Secondary | ICD-10-CM | POA: Diagnosis not present

## 2017-03-18 DIAGNOSIS — Y9389 Activity, other specified: Secondary | ICD-10-CM | POA: Diagnosis not present

## 2017-03-18 DIAGNOSIS — Z853 Personal history of malignant neoplasm of breast: Secondary | ICD-10-CM | POA: Diagnosis not present

## 2017-03-18 DIAGNOSIS — R51 Headache: Secondary | ICD-10-CM | POA: Insufficient documentation

## 2017-03-18 DIAGNOSIS — S40022A Contusion of left upper arm, initial encounter: Secondary | ICD-10-CM | POA: Insufficient documentation

## 2017-03-18 DIAGNOSIS — W19XXXA Unspecified fall, initial encounter: Secondary | ICD-10-CM

## 2017-03-18 LAB — PROTIME-INR
INR: 1.83
Prothrombin Time: 21 seconds — ABNORMAL HIGH (ref 11.4–15.2)

## 2017-03-18 NOTE — ED Notes (Signed)
Pt departed in NAD.  

## 2017-03-18 NOTE — ED Notes (Signed)
ED Provider at bedside. 

## 2017-03-18 NOTE — ED Triage Notes (Signed)
Pt arrives via POv from home, states she lost her balance and fell hitting her head, left upper arm and right hand. Pt awake, alert, oriented x4, appropriate. Pt with bruise to forehead, c/o left arm pain and has right hand skin tear. VSS.

## 2017-03-18 NOTE — ED Provider Notes (Signed)
[facility] EMERGENCY DEPARTMENT Provider Note   CSN: [id number] Arrival date & time: 03/18/17  1233     History   Chief Complaint Chief Complaint  Patient presents with  . Fall    HPI Jackie Alvarez is a 84 y.o. female.  HPI Patient is a 84 year old female presents the emergency department after falling forward while hovering over the commode.  She struck her head and injured her left arm.  She is most concerned about the bruising around her left arm.  She is on Coumadin.  She reports mild headache.  Denies neck pain.  No weakness of her arms or legs.  No chest or back pain.  No abdominal pain.  No other complaints at this time.  Symptoms are mild to moderate in severity and worse with palpation movement of her left arm   Past Medical History:  Diagnosis Date  . Anemia   . Anxiety   . Aortic stenosis 07/01/2016  . Atrial flutter ([place]) 2008   remote  . Cancer [facility]) 1994   Left Breast  . Carotid artery stenosis    mild bilateral, < 40% bilateral  . Central retinal vein occlusion, right eye   . Cholelithiases   . Chronic diastolic CHF (congestive heart failure) ([place])   . Chronic kidney disease    CKD stage III  . COPD (chronic obstructive pulmonary disease) ([place])   . Depression   . Diabetes mellitus   . GERD (gastroesophageal reflux disease)   . Hiatal hernia   . Hyperlipidemia   . Hypertension   . Obesity   . Peripheral neuropathy   . Pulmonary HTN ([place])    mild PASP 7mmHg by echo 12/2012  . Schatzki's ring   . Thyroid disease    toxic multinodular goiter with history of thyrotoxicosis now s/p RAI ablative therapy - now hypothyroid  . Ulcer 2009   gatric with history of bleeding - resolved    Patient Active Problem List   Diagnosis Date Noted  . Aortic stenosis 07/01/2016  . Pulmonary HTN ([place]) 06/25/2015  . Carotid artery disease ([place]) 06/25/2015  . Pain in limb-Bilateral calf 04/11/2013  . Persistent atrial fibrillation ([place]) 01/29/2013   . Hx of amiodarone therapy 01/29/2013  . Lethargy 01/28/2013  . Edema of extremities 12/28/2012  . HTN (hypertension) 12/27/2012  . Chronic diastolic heart failure ([place]) 12/27/2012  . Thyroid disease   . Anemia   . COPD (chronic obstructive pulmonary disease) with emphysema ([place]) 11/24/2012  . Pulmonary nodules 11/24/2012  . Bruise 12/29/2011  . Peripheral vascular disease, unspecified ([place]) 10/06/2011  . Atherosclerosis of native arteries of the extremities with intermittent claudication 02/17/2011    Past Surgical History:  Procedure Laterality Date  . ABDOMINAL AORTAGRAM  Jan. 18, 2013  . ABDOMINAL AORTAGRAM N/A 03/17/2011   Procedure: ABDOMINAL Maxcine Ham;  Surgeon: Serafina Mitchell, MD;  Location: [facility] CATH LAB;  Service: Cardiovascular;  Laterality: N/A;  . ABDOMINAL HYSTERECTOMY     with BSO  . BREAST LUMPECTOMY    . CHOLECYSTECTOMY     Gall Bladder  . HIP SURGERY Left   . PR VEIN BYPASS GRAFT,AORTO-FEM-POP    . WRIST SURGERY  02/2009    OB History    No data available       Home Medications    Prior to Admission medications   Medication Sig Start Date End Date Taking? Authorizing Provider  amLODipine (NORVASC) 10 MG tablet Take 10 mg by mouth daily.  [provider]  atorvastatin (LIPITOR) 40 MG tablet Take 1 tablet (40 mg total) by mouth daily. 07/09/16   Sueanne Margarita, MD  betamethasone dipropionate (DIPROLENE) 0.05 % cream  07/08/16   [provider]  citalopram (CELEXA) 40 MG tablet Take 40 mg by mouth daily. 11/28/14   [provider]  cloNIDine (CATAPRES) 0.1 MG tablet  07/03/16   [provider]  fluticasone (FLONASE) 50 MCG/ACT nasal spray Place 2 sprays into both nostrils daily.    [provider]  furosemide (LASIX) 40 MG tablet Take 2 (40mg ) tablets by mouth in AM and take 1 tablet in the afternoon 02/14/13   [provider]  glipiZIDE (GLUCOTROL XL) 2.5 MG 24 hr tablet Take 2.5 mg by mouth daily with  breakfast.  06/27/16   [provider]  hydrALAZINE (APRESOLINE) 100 MG tablet Take 100 mg by mouth 3 (three) times daily. 06/02/14   [provider]  JANUVIA 100 MG tablet Take 50 mg by mouth daily.  05/25/16   [provider]  levothyroxine (SYNTHROID, LEVOTHROID) 88 MCG tablet Take 88 mcg by mouth daily before breakfast. 06/05/14   [provider]  mirtazapine (REMERON) 30 MG tablet Take 30 mg by mouth every morning. 06/05/14   [provider]  potassium chloride SA (K-DUR,KLOR-CON) 20 MEQ tablet Take 20 mEq by mouth daily. 06/25/14   [provider]  warfarin (COUMADIN) 1 MG tablet Take 1 mg by mouth as directed. Take 2 mg on Sunday, Monday, Wednesday and Friday. 1.5 mg on Tuesday and Thursday 06/02/14   [provider]    Family History Family History  Problem Relation Age of Onset  . Cancer Mother        stomach  . Cancer Father        liver  . Cirrhosis Father   . Cancer Sister   . Diabetes Sister     Social History Social History   Tobacco Use  . Smoking status: Former Smoker    Packs/day: 1.00    Years: 45.00    Pack years: 45.00    Types: Cigarettes    Last attempt to quit: 01/26/1997    Years since quitting: 20.1  . Smokeless tobacco: Never Used  Substance Use Topics  . Alcohol use: No  . Drug use: No     Allergies   Metformin and related and Omnipaque [iohexol]   Review of Systems Review of Systems  All other systems reviewed and are negative.    Physical Exam Updated Vital Signs BP (!) 148/55 (BP Location: Right Arm)   Pulse 78   Temp 98.4 F (36.9 C) (Oral)   Resp 20   SpO2 100%   Physical Exam  Constitutional: She is oriented to person, place, and time. She appears well-developed and well-nourished.  HENT:  Head: Normocephalic.  Small bruising of forehead.  No large hematoma  Eyes: EOM are normal.  Neck: Normal range of motion. Neck supple.  Full range of motion of neck.  No C-spine  tenderness.  C-spine cleared by Nexus criteria.  Pulmonary/Chest: Effort normal.  Abdominal: She exhibits no distension.  Musculoskeletal: Normal range of motion.  Full range of motion bilateral hips and knee.  Full range of motion of bilateral shoulders elbows and wrists.  Tenderness and bruising of the mid to distal left humerus.  Compartments of the left upper extremity are soft.  Normal left radial pulse.  No obvious deformity of the left upper extremity  Neurological:  She is alert and oriented to person, place, and time.  Psychiatric: She has a normal mood and affect.  Nursing note and vitals reviewed.    ED Treatments / Results  Labs (all labs ordered are listed, but only abnormal results are displayed) Labs Reviewed  PROTIME-INR    EKG  EKG Interpretation None       Radiology Ct Head Wo Contrast  Result Date: 03/18/2017 CLINICAL DATA:  Pain following fall.  Headache. EXAM: CT HEAD WITHOUT CONTRAST TECHNIQUE: Contiguous axial images were obtained from the base of the skull through the vertex without intravenous contrast. COMPARISON:  May 10, 2006 FINDINGS: Brain: There is moderate diffuse atrophy with somewhat greater brainstem atrophy than elsewhere. There is no appreciable intracranial mass, hemorrhage, extra-axial fluid collection, or midline shift. Patchy small vessel disease in the centra semiovale bilaterally is stable. No acute infarct evident. Vascular: No evident hyperdense vessel. There is calcification in both carotid siphon regions. Skull: There are no blastic or lytic bone lesions. There is a small enostosis along the right frontal bone, stable. Sinuses/Orbits: There is mucosal thickening in multiple ethmoid air cells. There is a retention cyst in the anterior inferior right maxillary antrum measuring 1.2 x 1.0 cm. Other visualized paranasal sinuses are clear. Orbits appear symmetric bilaterally. Other: Mastoid air cells are clear. IMPRESSION: Moderate generalized  atrophy with somewhat greater brainstem atrophy than elsewhere. Patchy periventricular small vessel disease present. No acute infarct. No mass or hemorrhage. There are foci of arterial vascular calcification. There are foci of paranasal sinus disease. Electronically Signed   By: Lowella Grip III M.D.   On: 03/18/2017 14:54   Dg Humerus Left  Result Date: 03/18/2017 CLINICAL DATA:  Patient reports falling yesterday landing on the left side. Persistent left humeral pain. EXAM: LEFT HUMERUS - 2+ VIEW COMPARISON:  Left shoulder series of June 25, 2005. FINDINGS: The left humerus is subjectively adequately mineralized. There is no acute fracture. The observed portions of the left shoulder and left elbow are normal. The soft tissues are unremarkable. IMPRESSION: There is no acute bony abnormality of the left humerus. Electronically Signed   By: David  Martinique M.D.   On: 03/18/2017 14:09    Procedures Procedures (including critical care time)  Medications Ordered in ED Medications - No data to display   Initial Impression / Assessment and Plan / ED Course  I have reviewed the triage vital signs and the nursing notes.  Pertinent labs & imaging results that were available during my care of the patient were reviewed by me and considered in my medical decision making (see chart for details).     Imaging without acute traumatic abnormality.  Patient states she will take Tylenol at home.  Overall well-appearing.  Closed head injury warnings given.  Final Clinical Impressions(s) / ED Diagnoses   Final diagnoses:  Fall, initial encounter  Contusion of left upper arm, initial encounter    ED Discharge Orders    None       Jola Schmidt, MD 03/18/17 1941

## 2017-04-02 DIAGNOSIS — Z7901 Long term (current) use of anticoagulants: Secondary | ICD-10-CM | POA: Diagnosis not present

## 2017-04-09 DIAGNOSIS — E1140 Type 2 diabetes mellitus with diabetic neuropathy, unspecified: Secondary | ICD-10-CM | POA: Diagnosis not present

## 2017-04-09 DIAGNOSIS — Z7901 Long term (current) use of anticoagulants: Secondary | ICD-10-CM | POA: Diagnosis not present

## 2017-04-09 DIAGNOSIS — Z0000 Encounter for general adult medical examination without abnormal findings: Secondary | ICD-10-CM | POA: Diagnosis not present

## 2017-04-16 DIAGNOSIS — Z7901 Long term (current) use of anticoagulants: Secondary | ICD-10-CM | POA: Diagnosis not present

## 2017-04-28 DIAGNOSIS — D631 Anemia in chronic kidney disease: Secondary | ICD-10-CM | POA: Diagnosis not present

## 2017-04-28 DIAGNOSIS — E2839 Other primary ovarian failure: Secondary | ICD-10-CM | POA: Diagnosis not present

## 2017-04-28 DIAGNOSIS — N184 Chronic kidney disease, stage 4 (severe): Secondary | ICD-10-CM | POA: Diagnosis not present

## 2017-04-28 DIAGNOSIS — I129 Hypertensive chronic kidney disease with stage 1 through stage 4 chronic kidney disease, or unspecified chronic kidney disease: Secondary | ICD-10-CM | POA: Diagnosis not present

## 2017-04-28 DIAGNOSIS — M810 Age-related osteoporosis without current pathological fracture: Secondary | ICD-10-CM | POA: Diagnosis not present

## 2017-04-28 DIAGNOSIS — N2581 Secondary hyperparathyroidism of renal origin: Secondary | ICD-10-CM | POA: Diagnosis not present

## 2017-04-28 DIAGNOSIS — N183 Chronic kidney disease, stage 3 (moderate): Secondary | ICD-10-CM | POA: Diagnosis not present

## 2017-05-14 DIAGNOSIS — Z7901 Long term (current) use of anticoagulants: Secondary | ICD-10-CM | POA: Diagnosis not present

## 2017-06-14 DIAGNOSIS — Z7901 Long term (current) use of anticoagulants: Secondary | ICD-10-CM | POA: Diagnosis not present

## 2017-06-14 DIAGNOSIS — E1140 Type 2 diabetes mellitus with diabetic neuropathy, unspecified: Secondary | ICD-10-CM | POA: Diagnosis not present

## 2017-07-12 ENCOUNTER — Other Ambulatory Visit: Payer: Self-pay | Admitting: Cardiology

## 2017-07-12 MED ORDER — ATORVASTATIN CALCIUM 40 MG PO TABS: 40.0000 mg | ORAL_TABLET | Freq: Every day | ORAL | 0 refills | Status: DC

## 2017-07-14 ENCOUNTER — Ambulatory Visit (HOSPITAL_COMMUNITY): Payer: Medicare Other | Attending: Cardiology

## 2017-07-14 ENCOUNTER — Other Ambulatory Visit: Payer: Self-pay

## 2017-07-14 ENCOUNTER — Encounter: Payer: Self-pay | Admitting: Cardiology

## 2017-07-14 DIAGNOSIS — F419 Anxiety disorder, unspecified: Secondary | ICD-10-CM | POA: Diagnosis not present

## 2017-07-14 DIAGNOSIS — I10 Essential (primary) hypertension: Secondary | ICD-10-CM | POA: Diagnosis not present

## 2017-07-14 DIAGNOSIS — D649 Anemia, unspecified: Secondary | ICD-10-CM | POA: Diagnosis not present

## 2017-07-14 DIAGNOSIS — J449 Chronic obstructive pulmonary disease, unspecified: Secondary | ICD-10-CM | POA: Insufficient documentation

## 2017-07-14 DIAGNOSIS — I350 Nonrheumatic aortic (valve) stenosis: Secondary | ICD-10-CM | POA: Insufficient documentation

## 2017-07-14 DIAGNOSIS — N189 Chronic kidney disease, unspecified: Secondary | ICD-10-CM | POA: Insufficient documentation

## 2017-07-14 DIAGNOSIS — Z7901 Long term (current) use of anticoagulants: Secondary | ICD-10-CM | POA: Diagnosis not present

## 2017-07-14 DIAGNOSIS — I5032 Chronic diastolic (congestive) heart failure: Secondary | ICD-10-CM | POA: Insufficient documentation

## 2017-07-14 DIAGNOSIS — I272 Pulmonary hypertension, unspecified: Secondary | ICD-10-CM | POA: Insufficient documentation

## 2017-07-14 DIAGNOSIS — G629 Polyneuropathy, unspecified: Secondary | ICD-10-CM | POA: Insufficient documentation

## 2017-07-14 DIAGNOSIS — E079 Disorder of thyroid, unspecified: Secondary | ICD-10-CM | POA: Diagnosis not present

## 2017-07-15 ENCOUNTER — Telehealth: Payer: Self-pay

## 2017-07-15 DIAGNOSIS — I272 Pulmonary hypertension, unspecified: Secondary | ICD-10-CM

## 2017-07-15 NOTE — Telephone Encounter (Signed)
Notes recorded by Teressa Senter, RN on 07/15/2017 at 4:07 PM EDT Patient made aware of echo results. Patient informed to repeat echo in one year to reassess pulmonary HTN. Patient in agreement with treatment plan and thankful for the call. Echo ordered to be scheduled in 1 year (07/2018)   Notes recorded by Sueanne Margarita, MD on 07/14/2017 at 4:11 PM EDT Echo showed normal LVF with EF 65-70% with moderately thickened AV leaflets with mild AS and mild pulmonary HTN - repeat echo in 1 year to followup on pulmonary HTN

## 2017-07-20 ENCOUNTER — Ambulatory Visit (HOSPITAL_COMMUNITY)
Admission: RE | Admit: 2017-07-20 | Discharge: 2017-07-20 | Disposition: A | Discharge status: Home / Self Care | Payer: Medicare Other | Source: Ambulatory Visit | Attending: Family | Admitting: Family

## 2017-07-20 ENCOUNTER — Encounter: Payer: Self-pay | Admitting: Family

## 2017-07-20 ENCOUNTER — Ambulatory Visit (INDEPENDENT_AMBULATORY_CARE_PROVIDER_SITE_OTHER)
Admission: RE | Admit: 2017-07-20 | Discharge: 2017-07-20 | Disposition: A | Discharge status: Home / Self Care | Payer: Medicare Other | Source: Ambulatory Visit | Attending: Family | Admitting: Family

## 2017-07-20 ENCOUNTER — Ambulatory Visit (INDEPENDENT_AMBULATORY_CARE_PROVIDER_SITE_OTHER): Payer: Medicare Other | Admitting: Family

## 2017-07-20 VITALS — BP 165/58 | HR 78 | Temp 97.4°F | Resp 16 | Ht 66.0 in | Wt 160.0 lb

## 2017-07-20 DIAGNOSIS — I739 Peripheral vascular disease, unspecified: Secondary | ICD-10-CM

## 2017-07-20 DIAGNOSIS — Z87891 Personal history of nicotine dependence: Secondary | ICD-10-CM | POA: Diagnosis not present

## 2017-07-20 DIAGNOSIS — I6523 Occlusion and stenosis of bilateral carotid arteries: Secondary | ICD-10-CM | POA: Diagnosis not present

## 2017-07-20 DIAGNOSIS — Z95828 Presence of other vascular implants and grafts: Secondary | ICD-10-CM

## 2017-07-20 DIAGNOSIS — I771 Stricture of artery: Secondary | ICD-10-CM | POA: Insufficient documentation

## 2017-07-20 NOTE — Progress Notes (Signed)
[facility] [place] HISTORY AND PHYSICAL   CC: Follow up extracranial carotid artery stenosis and peripheral artery occlusive disease    History of Present Illness:   Jackie Alvarez is a 84 y.o. female whom Dr. Kellie Simmering has monitored for known extracranial carotid artery stenosis as well as a past vascular history of multiple surgeries including an aortobifem bypass graft in 1998 and 2001 a left profunda femoris to peroneal bypass graft using lesser saphenous vein with multiple revisions of the distal anastomosis.   She reports both legs feeling weak after walking about 125 feet to her mailbox, improved with rest.    She denies any history of TIA or stroke symptom. Specifically she denies a history of amaurosis fugax or monocular blindness, unilateral facial drooping, hemiplegia, or receptive or expressive aphasia.   She had pneumonia October 2015, was treated as an out patient, lost her appetite and lost 32 pounds; since this weight loss she has had no more claudication.  She weighed 144# on 07-02-15, states she feels better with more weight on.  Pt denies steal symptoms in either arm.  She states she feels well, no longer has DM.  She sees Dr. Mercy Moore for CRF, he added carvedilol.   She fell in her bathroom about March 2019, was evaluated in ED, no injury other than bruised arm. Since then she has been afraid to walk, afraid of falling; states she does have a rolling walker.   Pt Diabetic: no longer has DM since she lost so much weight Pt smoker: former smoker, quit in 1994  Pt meds include:  Statin : Yes  ASA: No  Other anticoagulants/antiplatelets: coumadin, possibly for atrial flutter, was started Oct., 2014, per pt., her heart rate was in the 30's, does not have a pacemaker.     Current Outpatient Medications  Medication Sig Dispense Refill  . amLODipine (NORVASC) 10 MG tablet Take 10 mg by mouth daily.      Marland Kitchen atorvastatin (LIPITOR) 40 MG  tablet Take 1 tablet (40 mg total) by mouth daily. Please keep upcoming appt for future refills. Thank you 90 tablet 0  . betamethasone dipropionate (DIPROLENE) 0.05 % cream     . citalopram (CELEXA) 40 MG tablet Take 40 mg by mouth daily.    . cloNIDine (CATAPRES) 0.1 MG tablet     . fluticasone (FLONASE) 50 MCG/ACT nasal spray Place 2 sprays into both nostrils daily.    . furosemide (LASIX) 40 MG tablet Take 2 (40mg ) tablets by mouth in AM and take 1 tablet in the afternoon    . glipiZIDE (GLUCOTROL XL) 2.5 MG 24 hr tablet Take 2.5 mg by mouth daily with breakfast.     . hydrALAZINE (APRESOLINE) 100 MG tablet Take 100 mg by mouth 3 (three) times daily.    Marland Kitchen JANUVIA 100 MG tablet Take 50 mg by mouth daily.     Marland Kitchen levothyroxine (SYNTHROID, LEVOTHROID) 88 MCG tablet Take 88 mcg by mouth daily before breakfast.    . mirtazapine (REMERON) 30 MG tablet Take 30 mg by mouth every morning.    . potassium chloride SA (K-DUR,KLOR-CON) 20 MEQ tablet Take 20 mEq by mouth daily.    Marland Kitchen warfarin (COUMADIN) 1 MG tablet Take 1 mg by mouth as directed. Take 2 mg on Sunday, Monday, Wednesday and Friday. 1.5 mg on Tuesday and Thursday     No current facility-administered medications for this visit.     Past Medical History:  Diagnosis Date  .  Anemia   . Anxiety   . Aortic stenosis 07/01/2016   mild by echo 07/2017  . Atrial flutter ([place]) 2008   remote  . Cancer [facility]) 1994   Left Breast  . Carotid artery stenosis    mild bilateral, < 40% bilateral  . Central retinal vein occlusion, right eye   . Cholelithiases   . Chronic diastolic CHF (congestive heart failure) ([place])   . Chronic kidney disease    CKD stage III  . COPD (chronic obstructive pulmonary disease) ([place])   . Depression   . Diabetes mellitus   . GERD (gastroesophageal reflux disease)   . Hiatal hernia   . Hyperlipidemia   . Hypertension   . Obesity   . Peripheral neuropathy   . Pulmonary HTN ([place])    mild PASP 27mmHg by echo 07/2017  .  Schatzki's ring   . Thyroid disease    toxic multinodular goiter with history of thyrotoxicosis now s/p RAI ablative therapy - now hypothyroid  . Ulcer 2009   gatric with history of bleeding - resolved    Social History Social History   Tobacco Use  . Smoking status: Former Smoker    Packs/day: 1.00    Years: 45.00    Pack years: 45.00    Types: Cigarettes    Last attempt to quit: 01/26/1997    Years since quitting: 20.4  . Smokeless tobacco: Never Used  Substance Use Topics  . Alcohol use: No  . Drug use: No    Family History Family History  Problem Relation Age of Onset  . Cancer Mother        stomach  . Cancer Father        liver  . Cirrhosis Father   . Cancer Sister   . Diabetes Sister     Surgical History Past Surgical History:  Procedure Laterality Date  . ABDOMINAL AORTAGRAM  Jan. 18, 2013  . ABDOMINAL AORTAGRAM N/A 03/17/2011   Procedure: ABDOMINAL Maxcine Ham;  Surgeon: Serafina Mitchell, MD;  Location: [facility] CATH LAB;  Service: Cardiovascular;  Laterality: N/A;  . ABDOMINAL HYSTERECTOMY     with BSO  . BREAST LUMPECTOMY    . CHOLECYSTECTOMY     Gall Bladder  . HIP SURGERY Left   . PR VEIN BYPASS GRAFT,AORTO-FEM-POP    . WRIST SURGERY  02/2009    Allergies  Allergen Reactions  . Metformin And Related Other (See Comments)    Renal insuff  . Omnipaque [Iohexol] Nausea And Vomiting    Current Outpatient Medications  Medication Sig Dispense Refill  . amLODipine (NORVASC) 10 MG tablet Take 10 mg by mouth daily.      Marland Kitchen atorvastatin (LIPITOR) 40 MG tablet Take 1 tablet (40 mg total) by mouth daily. Please keep upcoming appt for future refills. Thank you 90 tablet 0  . betamethasone dipropionate (DIPROLENE) 0.05 % cream     . citalopram (CELEXA) 40 MG tablet Take 40 mg by mouth daily.    . cloNIDine (CATAPRES) 0.1 MG tablet     . fluticasone (FLONASE) 50 MCG/ACT nasal spray Place 2 sprays into both nostrils daily.    . furosemide (LASIX) 40 MG tablet Take 2  (40mg ) tablets by mouth in AM and take 1 tablet in the afternoon    . glipiZIDE (GLUCOTROL XL) 2.5 MG 24 hr tablet Take 2.5 mg by mouth daily with breakfast.     . hydrALAZINE (APRESOLINE) 100 MG tablet Take 100 mg by mouth 3 (three) times daily.    Marland Kitchen  JANUVIA 100 MG tablet Take 50 mg by mouth daily.     Marland Kitchen levothyroxine (SYNTHROID, LEVOTHROID) 88 MCG tablet Take 88 mcg by mouth daily before breakfast.    . mirtazapine (REMERON) 30 MG tablet Take 30 mg by mouth every morning.    . potassium chloride SA (K-DUR,KLOR-CON) 20 MEQ tablet Take 20 mEq by mouth daily.    Marland Kitchen warfarin (COUMADIN) 1 MG tablet Take 1 mg by mouth as directed. Take 2 mg on Sunday, Monday, Wednesday and Friday. 1.5 mg on Tuesday and Thursday     No current facility-administered medications for this visit.      REVIEW OF SYSTEMS: See HPI for pertinent positives and negatives.  Physical Examination Vitals:   07/20/17 1413 07/20/17 1417  BP: (!) 183/64 (!) 165/58  Pulse: 78   Resp: 16   Temp: (!) 97.4 F (36.3 C)   TempSrc: Oral   SpO2: 98%   Weight: 160 lb (72.6 kg)   Height: 5\' 6"  (1.676 m)    Body mass index is 25.82 kg/m.  General:  WDWN female in NAD  GAIT:normal Eyes: PERRLA  Pulmonary: Respirations are non labored, CTAB Cardiac: Irregular rhythm, controlled rate, + murmur.   VASCULAR EXAM Carotid Bruits Left Right   Negative  Transmitted cardiac murmur   Abdominal aortic pulse is palpable.  Radial pulses are 1+ right, 2+ left palpable.   LE Pulses  LEFT  RIGHT   FEMORAL  palpable palpable   POPLITEAL  not palpable  not palpable  POSTERIOR TIBIAL  not palpable  not palpable   DORSALIS PEDIS ANTERIOR TIBIAL  1+ palpable  not palpable    Gastrointestinal: soft, nontender, BS WNL, no r/g, no palpable masses Musculoskeletal: No muscle atrophy/wasting. M/S 4/5throughout, Extremities without ischemic changes. No peripheral edema.  Skin: No  rash, no cellulitis, no ulcers noted.  Neurologic:  A&O X 3; appropriate affect, sensation is normal; speech is normal, CN 2-12 intact, pain and light touch intact in extremities, motor exam as listed above.   Psychiatric: Normal thought content, mood appropriate to clinical situation.    ASSESSMENT:  Jackie Alvarez is a 84 y.o. female with known extracranial carotid artery stenosis as well as a past vascular history of multiple surgeries including an aortobifem bypass graft in 1998 and 2001 a left profunda femoris to peroneal bypass graft using lesser saphenous vein with multiple revisions of the distal anastomosis.   She no longer has lower extremity claudication since she lost 32 pounds after a long bout of pneumonia.  She has no signs of ischemia in her feet/legs.  She has no history of stroke or TIA. She feels very well.  Her atherosclerotic risk factors include former diabetic, former smoker, atrial flutter, CKD stage 3, and COPD. She takes a statin and coumadin.     DATA  Carotid Duplex (07/20/17): Right ICA: 40-59% stenosis Left ICA: 1-39% stenosis Bilateral vertebral artery flow is antegrade.  Bilateral subclavian artery waveforms are normal.  No significant change since exams of 07-02-15 and 07-11-16.  Left LE Arterial Duplex (07/20/17): 50-70% stenosis (234 cm/s) in the proximal anastomosis of the bypass graft, unchanged compared to prior study on 07-11-16. All tri and biphasic waveforms.    Bilateral ABI's today indicate non-compressible vessels (medial calcification), bilateral waveforms are brisk monophasic. Right TBI is 0.37 (was 0.29 on 07-02-15), left TBI is 0.56 (was 0.61).was    ABI (Date: 07/20/2017):  R:   ABI: 1.03 (was 1.12 on 07-11-16),   PT: mono  DP: mono  TBI:  0.29, toe pressure 55 (was 0.37)  L:   ABI: 1.12 (was 0.96),   PT: mono  DP: mono   Pero: bi  TBI: 0.83, toe pressure 159 (was 0.56) Bilateral ABI's do not correspond to all  monophasic waveforms, are unreliable, except for left peroneal at 1.03 with biphasic waveforms. Decline in right TBI, improved left TBI.     07-26-14 abdominal duplex to evaluate prominent abdominal pulse: normal diameters of abdominal aorta and bilateral common iliac arteries.      PLAN:  Based on the patient's vascular studies and examination, pt will return to clinic in 1 year with carotid duplex, ABI's, and left LE arterial duplex.  I advised pt to notify us should she develop concerns re the circulation in her legs.    I discussed in depth with the patient the nature of atherosclerosis, and emphasized the importance of maximal medical management including strict control of blood pressure, blood glucose, and lipid levels, obtaining regular exercise, and cessation of smoking.  The patient is aware that without maximal medical management the underlying atherosclerotic disease process will progress, limiting the benefit of any interventions.  Consideration for repair of AAA would be made when the size approaches 5.0 cm, growth > 1 cm/yr, and symptomatic status. The patient was given information about AAA including signs, symptoms, treatment,  what symptoms should prompt the patient to seek immediate medical care, and how to minimize the risk of enlargement and rupture of aneurysms.   The patient was given information about stroke prevention and what symptoms should prompt the patient to seek immediate medical care.  The patient was given information about PAD including signs, symptoms, treatment, what symptoms should prompt the patient to seek immediate medical care, and risk reduction measures to take.  Thank you for allowing [place] to participate in this patient's care.  Clemon Chambers, RN, MSN, FNP-C [facility] Office: [phone]  Clinic MD: Early 07/20/2017 2:58 PM

## 2017-07-20 NOTE — Patient Instructions (Signed)
Stroke Prevention Some health problems and behaviors may make it more likely for you to have a stroke. Below are ways to lessen your risk of having a stroke.  Be active for at least 30 minutes on most or all days.  Do not smoke. Try not to be around others who smoke.  Do not drink too much alcohol. ? Do not have more than 2 drinks a day if you are a man. ? Do not have more than 1 drink a day if you are a woman and are not pregnant.  Eat healthy foods, such as fruits and vegetables. If you were put on a specific diet, follow the diet as told.  Keep your cholesterol levels under control through diet and medicines. Look for foods that are low in saturated fat, trans fat, cholesterol, and are high in fiber.  If you have diabetes, follow all diet plans and take your medicine as told.  Ask your doctor if you need treatment to lower your blood pressure. If you have high blood pressure (hypertension), follow all diet plans and take your medicine as told by your doctor.  If you are 18-39 years old, have your blood pressure checked every 3-5 years. If you are age 40 or older, have your blood pressure checked every year.  Keep a healthy weight. Eat foods that are low in calories, salt, saturated fat, trans fat, and cholesterol.  Do not take drugs.  Avoid birth control pills, if this applies. Talk to your doctor about the risks of taking birth control pills.  Talk to your doctor if you have sleep problems (sleep apnea).  Take all medicine as told by your doctor. ? You may be told to take aspirin or blood thinner medicine. Take this medicine as told by your doctor. ? Understand your medicine instructions.  Make sure any other conditions you have are being taken care of.  Get help right away if:  You suddenly lose feeling (you feel numb) or have weakness in your face, arm, or leg.  Your face or eyelid hangs down to one side.  You suddenly feel confused.  You have trouble talking  (aphasia) or understanding what people are saying.  You suddenly have trouble seeing in one or both eyes.  You suddenly have trouble walking.  You are dizzy.  You lose your balance or your movements are clumsy (uncoordinated).  You suddenly have a very bad headache and you do not know the cause.  You have new chest pain.  Your heart feels like it is fluttering or skipping a beat (irregular heartbeat). Do not wait to see if the symptoms above go away. Get help right away. Call your local emergency services (911 in [place] Do not drive yourself to the hospital. This information is not intended to replace advice given to you by your health care provider. Make sure you discuss any questions you have with your health care provider. Document Released: 08/25/2011 Document Revised: 08/01/2015 Document Reviewed: 08/26/2012 Elsevier Interactive Patient Education  2018 [employer] Inc[employer]     Peripheral Vascular Disease Peripheral vascular disease (PVD) is a disease of the blood vessels that are not part of your heart and brain. A simple term for PVD is poor circulation. In most cases, PVD narrows the blood vessels that carry blood from your heart to the rest of your body. This can result in a decreased supply of blood to your arms, legs, and internal organs, like your stomach or kidneys. However, it most often affects   a person's lower legs and feet. There are two types of PVD.  Organic PVD. This is the more common type. It is caused by damage to the structure of blood vessels.  Functional PVD. This is caused by conditions that make blood vessels contract and tighten (spasm).  Without treatment, PVD tends to get worse over time. PVD can also lead to acute ischemic limb. This is when an arm or limb suddenly has trouble getting enough blood. This is a medical emergency. Follow these instructions at home:  Take medicines only as told by your doctor.  Do not use any tobacco products, including  cigarettes, chewing tobacco, or electronic cigarettes. If you need help quitting, ask your doctor.  Lose weight if you are overweight, and maintain a healthy weight as told by your doctor.  Eat a diet that is low in fat and cholesterol. If you need help, ask your doctor.  Exercise regularly. Ask your doctor for some good activities for you.  Take good care of your feet. ? Wear comfortable shoes that fit well. ? Check your feet often for any cuts or sores. Contact a doctor if:  You have cramps in your legs while walking.  You have leg pain when you are at rest.  You have coldness in a leg or foot.  Your skin changes.  You are unable to get or have an erection (erectile dysfunction).  You have cuts or sores on your feet that are not healing. Get help right away if:  Your arm or leg turns cold and blue.  Your arms or legs become red, warm, swollen, painful, or numb.  You have chest pain or trouble breathing.  You suddenly have weakness in your face, arm, or leg.  You become very confused or you cannot speak.  You suddenly have a very bad headache.  You suddenly cannot see. This information is not intended to replace advice given to you by your health care provider. Make sure you discuss any questions you have with your health care provider. Document Released: 05/20/2009 Document Revised: 08/01/2015 Document Reviewed: 08/03/2013 Elsevier Interactive Patient Education  2017 [employer] Inc.  

## 2017-07-27 DIAGNOSIS — N2581 Secondary hyperparathyroidism of renal origin: Secondary | ICD-10-CM | POA: Diagnosis not present

## 2017-07-27 DIAGNOSIS — N184 Chronic kidney disease, stage 4 (severe): Secondary | ICD-10-CM | POA: Diagnosis not present

## 2017-07-27 DIAGNOSIS — D631 Anemia in chronic kidney disease: Secondary | ICD-10-CM | POA: Diagnosis not present

## 2017-07-27 DIAGNOSIS — I129 Hypertensive chronic kidney disease with stage 1 through stage 4 chronic kidney disease, or unspecified chronic kidney disease: Secondary | ICD-10-CM | POA: Diagnosis not present

## 2017-08-06 DIAGNOSIS — H838X3 Other specified diseases of inner ear, bilateral: Secondary | ICD-10-CM | POA: Diagnosis not present

## 2017-08-06 DIAGNOSIS — H6121 Impacted cerumen, right ear: Secondary | ICD-10-CM | POA: Diagnosis not present

## 2017-08-06 DIAGNOSIS — H903 Sensorineural hearing loss, bilateral: Secondary | ICD-10-CM | POA: Diagnosis not present

## 2017-08-20 DIAGNOSIS — Z1231 Encounter for screening mammogram for malignant neoplasm of breast: Secondary | ICD-10-CM | POA: Diagnosis not present

## 2017-08-20 DIAGNOSIS — Z853 Personal history of malignant neoplasm of breast: Secondary | ICD-10-CM | POA: Diagnosis not present

## 2017-08-26 ENCOUNTER — Ambulatory Visit (INDEPENDENT_AMBULATORY_CARE_PROVIDER_SITE_OTHER): Payer: Medicare Other | Admitting: Cardiology

## 2017-08-26 ENCOUNTER — Encounter: Payer: Self-pay | Admitting: Cardiology

## 2017-08-26 VITALS — BP 180/48 | HR 89 | Ht 66.0 in | Wt 156.0 lb

## 2017-08-26 DIAGNOSIS — I350 Nonrheumatic aortic (valve) stenosis: Secondary | ICD-10-CM

## 2017-08-26 DIAGNOSIS — I10 Essential (primary) hypertension: Secondary | ICD-10-CM

## 2017-08-26 DIAGNOSIS — I272 Pulmonary hypertension, unspecified: Secondary | ICD-10-CM

## 2017-08-26 DIAGNOSIS — I5032 Chronic diastolic (congestive) heart failure: Secondary | ICD-10-CM

## 2017-08-26 DIAGNOSIS — I481 Persistent atrial fibrillation: Secondary | ICD-10-CM

## 2017-08-26 DIAGNOSIS — E785 Hyperlipidemia, unspecified: Secondary | ICD-10-CM | POA: Diagnosis not present

## 2017-08-26 DIAGNOSIS — I4819 Other persistent atrial fibrillation: Secondary | ICD-10-CM

## 2017-08-26 DIAGNOSIS — I6523 Occlusion and stenosis of bilateral carotid arteries: Secondary | ICD-10-CM | POA: Diagnosis not present

## 2017-08-26 NOTE — Progress Notes (Signed)
Cardiology Office Note:    Date:  08/26/2017   ID:  Jackie Alvarez, DOB 1931/06/16, MRN [id number]  PCP:  Maurice Small, MD  Cardiologist:  No primary care provider on file.    Referring MD: Maurice Small, MD   Chief Complaint  Patient presents with  . Congestive Heart Failure  . Atrial Flutter  . Aortic Stenosis    History of Present Illness:    Jackie Alvarez is a 84 y.o. female with a hx of chronic diastolic CHF, HTN, CKD stage III, remote atrial flutter and mild AS.  She is here today for followup and is doing well.  She denies any chest pain or pressure, SOB, DOE, PND, orthopnea, LE edema, dizziness, palpitations or syncope. She is compliant with his meds and is tolerating meds with no SE.    Past Medical History:  Diagnosis Date  . Anemia   . Anxiety   . Aortic stenosis 07/01/2016   mild by echo 07/2017  . Atrial flutter ([place]) 2008   remote  . Cancer [facility]) 1994   Left Breast  . Carotid artery stenosis    mild bilateral, < 40% bilateral  . Central retinal vein occlusion, right eye   . Cholelithiases   . Chronic diastolic CHF (congestive heart failure) ([place])   . Chronic kidney disease    CKD stage III  . COPD (chronic obstructive pulmonary disease) ([place])   . Depression   . Diabetes mellitus   . GERD (gastroesophageal reflux disease)   . Hiatal hernia   . Hyperlipidemia   . Hypertension   . Obesity   . Peripheral neuropathy   . Pulmonary HTN ([place])    mild PASP 90mmHg by echo 07/2017  . Schatzki's ring   . Thyroid disease    toxic multinodular goiter with history of thyrotoxicosis now s/p RAI ablative therapy - now hypothyroid  . Ulcer 2009   gatric with history of bleeding - resolved    Past Surgical History:  Procedure Laterality Date  . ABDOMINAL AORTAGRAM  Jan. 18, 2013  . ABDOMINAL AORTAGRAM N/A 03/17/2011   Procedure: ABDOMINAL Maxcine Ham;  Surgeon: Serafina Mitchell, MD;  Location: [facility] CATH LAB;  Service: Cardiovascular;  Laterality: N/A;  . ABDOMINAL  HYSTERECTOMY     with BSO  . BREAST LUMPECTOMY    . CHOLECYSTECTOMY     Gall Bladder  . HIP SURGERY Left   . PR VEIN BYPASS GRAFT,AORTO-FEM-POP    . WRIST SURGERY  02/2009    Current Medications: Current Meds  Medication Sig  . amLODipine (NORVASC) 10 MG tablet Take 10 mg by mouth daily.    Marland Kitchen atorvastatin (LIPITOR) 40 MG tablet Take 1 tablet (40 mg total) by mouth daily. Please keep upcoming appt for future refills. Thank you  . betamethasone dipropionate (DIPROLENE) 0.05 % cream   . citalopram (CELEXA) 40 MG tablet Take 40 mg by mouth daily.  . cloNIDine (CATAPRES) 0.1 MG tablet   . fluticasone (FLONASE) 50 MCG/ACT nasal spray Place 2 sprays into both nostrils daily.  . furosemide (LASIX) 40 MG tablet Take 2 (40mg ) tablets by mouth in AM and take 1 tablet in the afternoon  . glipiZIDE (GLUCOTROL) 5 MG tablet Take 5 mg by mouth daily before breakfast.  . hydrALAZINE (APRESOLINE) 100 MG tablet Take 100 mg by mouth 3 (three) times daily.  Marland Kitchen JANUVIA 100 MG tablet Take 50 mg by mouth daily.   Marland Kitchen levothyroxine (SYNTHROID, LEVOTHROID) 88 MCG tablet Take 88 mcg by  mouth daily before breakfast.  . mirtazapine (REMERON) 30 MG tablet Take 30 mg by mouth every morning.  . potassium chloride SA (K-DUR,KLOR-CON) 20 MEQ tablet Take 20 mEq by mouth daily.  Marland Kitchen warfarin (COUMADIN) 1 MG tablet Take 1 mg by mouth as directed. Take 2 mg on Sunday, Monday, Wednesday and Friday. 1.5 mg on Tuesday and Thursday     Allergies:   Metformin and related and Omnipaque [iohexol]   Social History   Socioeconomic History  . Marital status: Married    Spouse name: Not on file  . Number of children: 1  . Years of education: Not on file  . Highest education level: Not on file  Occupational History  . Occupation: retired  Scientific laboratory technician  . Financial resource strain: Not on file  . Food insecurity:    Worry: Not on file    Inability: Not on file  . Transportation needs:    Medical: Not on file    Non-medical:  Not on file  Tobacco Use  . Smoking status: Former Smoker    Packs/day: 1.00    Years: 45.00    Pack years: 45.00    Types: Cigarettes    Last attempt to quit: 01/26/1997    Years since quitting: 20.5  . Smokeless tobacco: Never Used  Substance and Sexual Activity  . Alcohol use: No  . Drug use: No  . Sexual activity: Never    Birth control/protection: Post-menopausal  Lifestyle  . Physical activity:    Days per week: Not on file    Minutes per session: Not on file  . Stress: Not on file  Relationships  . Social connections:    Talks on phone: Not on file    Gets together: Not on file    Attends religious service: Not on file    Active member of club or organization: Not on file    Attends meetings of clubs or organizations: Not on file    Relationship status: Not on file  Other Topics Concern  . Not on file  Social History Narrative   Son lives with her in her house.      Family History: The patient's family history includes Cancer in her father, mother, and sister; Cirrhosis in her father; Diabetes in her sister.  ROS:   Please see the history of present illness.    ROS  All other systems reviewed and negative.   EKGs/Labs/Other Studies Reviewed:    The following studies were reviewed today: none  EKG:  EKG is  ordered today.  The ekg ordered today demonstrates normal sinus rhythm 89 bpm with nonspecific ST-T wave of abnormality unchanged from 2018  Recent Labs: No results found for requested labs within last 8760 hours.   Recent Lipid Panel    Component Value Date/Time   CHOL 150 09/02/2015 1110   TRIG 170 (H) 09/02/2015 1110   HDL 58 09/02/2015 1110   CHOLHDL 2.6 09/02/2015 1110   VLDL 34 (H) 09/02/2015 1110   LDLCALC 58 09/02/2015 1110    Physical Exam:    VS:  BP (!) 180/48   Pulse 89   Ht 5\' 6"  (1.676 m)   Wt 156 lb (70.8 kg)   BMI 25.18 kg/m     Wt Readings from Last 3 Encounters:  08/26/17 156 lb (70.8 kg)  07/20/17 160 lb (72.6 kg)    07/21/16 165 lb (74.8 kg)     GEN:  Well nourished, well developed in no acute distress  HEENT: Normal NECK: No JVD; No carotid bruits LYMPHATICS: No lymphadenopathy CARDIAC: RRR, no  rubs, gallops. 2/6 SM at RUSB to LLSB RESPIRATORY:  Clear to auscultation without rales, wheezing or rhonchi  ABDOMEN: Soft, non-tender, non-distended MUSCULOSKELETAL:  No edema; No deformity  SKIN: Warm and dry NEUROLOGIC:  Alert and oriented x 3 PSYCHIATRIC:  Normal affect   ASSESSMENT:    1. Chronic diastolic heart failure ([place])   2. Essential hypertension   3. Persistent atrial fibrillation ([place])   4. Bilateral carotid artery stenosis   5. Nonrheumatic aortic valve stenosis   6. Pulmonary HTN ([place])   7. Hyperlipidemia LDL goal <70    PLAN:    In order of problems listed above:  1.  Chronic diastolic heart failure - she appears euvolemic on exam today.  She will continue on Lasix 80 mg in the a.m. and 40 mg in the p.m. Creatinine was 0.7 on 05/18/2017.  2.  Hypertension - BP poorly controlled on exam today she says at home her systolic blood pressure runs 140 to 150 mmHg.Marland Kitchen  She will continue on clonidine 0.1 mg twice daily, amlodipine 10 mg daily, hydralazine 100 mg 3 times daily.    3.  Persistent atrial fibrillation/atrial flutter -she is maintaining normal sinus rhythm on exam today.  She will continue on warfarin.  She has not had any complications of excessive bleeding.  Hemoglobin was 13 on 05/04/2016.  I will recheck CBC today.  4.  Bilateral carotid artery stenosis -carotid Dopplers 07/20/2017 showed 40 to 59% right ICA and 1 to 39% left ICA stenosis.  Repeat Dopplers in 1 year.  She will continue on aspirin and statin therapy.  5.  Mild aortic stenosis -2D echocardiogram 07/14/2017 showed mild aortic stenosis with mean aortic valve gradient 13 mmHg.  6.  Pulmonary hypertension -moderate with PASP 43 mmHg by echo 07/14/2017.  This is likely related to pulmonary venous hypertension from  chronic diastolic heart failure.  We will repeat 2D echocardiogram in 1 year to make sure this is stable.  Continue diuretic therapy  7.  Hyperlipidemia - her LDL was at goal at 47 on 03/03/2017 and ALT was normal at 14 on 07/14/2017.  She will continue on atorvastatin 40 mg daily.  Medication Adjustments/Labs and Tests Ordered: Current medicines are reviewed at length with the patient today.  Concerns regarding medicines are outlined above.  Orders Placed This Encounter  Procedures  . EKG 12-Lead   No orders of the defined types were placed in this encounter.   Signed, Fransico Him, MD  08/26/2017 11:19 AM    [place]

## 2017-08-26 NOTE — Progress Notes (Signed)
EK 

## 2017-08-26 NOTE — Patient Instructions (Addendum)
Medication Instructions:  Your physician recommends that you continue on your current medications as directed. Please refer to the Current Medication list given to you today.   Labwork: None ordered  Testing/Procedures: Your physician has requested that you have an echocardiogram IN 1 YEAR. Echocardiography is a painless test that uses sound waves to create images of your heart. It provides your doctor with information about the size and shape of your heart and how well your heart's chambers and valves are working. This procedure takes approximately one hour. There are no restrictions for this procedure.  Your physician has requested that you have a carotid duplex IN 1 YEAR. This test is an ultrasound of the carotid arteries in your neck. It looks at blood flow through these arteries that supply the brain with blood. Allow one hour for this exam. There are no restrictions or special instructions.    Follow-Up: Your physician wants you to follow-up in:  [place] will receive a reminder letter in the mail two months in advance. If you don't receive a letter, please call our office to schedule the follow-up appointment.   Any Other Special Instructions Will Be Listed Below (If Applicable).     If you need a refill on your cardiac medications before your next appointment, please call your pharmacy.

## 2017-09-06 DIAGNOSIS — E039 Hypothyroidism, unspecified: Secondary | ICD-10-CM | POA: Diagnosis not present

## 2017-09-06 DIAGNOSIS — Z7984 Long term (current) use of oral hypoglycemic drugs: Secondary | ICD-10-CM | POA: Diagnosis not present

## 2017-09-06 DIAGNOSIS — E559 Vitamin D deficiency, unspecified: Secondary | ICD-10-CM | POA: Diagnosis not present

## 2017-09-06 DIAGNOSIS — Z7901 Long term (current) use of anticoagulants: Secondary | ICD-10-CM | POA: Diagnosis not present

## 2017-09-06 DIAGNOSIS — E785 Hyperlipidemia, unspecified: Secondary | ICD-10-CM | POA: Diagnosis not present

## 2017-09-06 DIAGNOSIS — I10 Essential (primary) hypertension: Secondary | ICD-10-CM | POA: Diagnosis not present

## 2017-09-06 DIAGNOSIS — E113291 Type 2 diabetes mellitus with mild nonproliferative diabetic retinopathy without macular edema, right eye: Secondary | ICD-10-CM | POA: Diagnosis not present

## 2017-10-03 ENCOUNTER — Other Ambulatory Visit: Payer: Self-pay | Admitting: Cardiology

## 2017-11-05 DIAGNOSIS — I10 Essential (primary) hypertension: Secondary | ICD-10-CM | POA: Diagnosis not present

## 2017-11-05 DIAGNOSIS — Z7901 Long term (current) use of anticoagulants: Secondary | ICD-10-CM | POA: Diagnosis not present

## 2017-11-26 DIAGNOSIS — N2581 Secondary hyperparathyroidism of renal origin: Secondary | ICD-10-CM | POA: Diagnosis not present

## 2017-11-26 DIAGNOSIS — N184 Chronic kidney disease, stage 4 (severe): Secondary | ICD-10-CM | POA: Diagnosis not present

## 2017-11-26 DIAGNOSIS — N189 Chronic kidney disease, unspecified: Secondary | ICD-10-CM | POA: Diagnosis not present

## 2017-11-26 DIAGNOSIS — I129 Hypertensive chronic kidney disease with stage 1 through stage 4 chronic kidney disease, or unspecified chronic kidney disease: Secondary | ICD-10-CM | POA: Diagnosis not present

## 2017-11-26 DIAGNOSIS — D631 Anemia in chronic kidney disease: Secondary | ICD-10-CM | POA: Diagnosis not present

## 2017-12-06 DIAGNOSIS — Z7901 Long term (current) use of anticoagulants: Secondary | ICD-10-CM | POA: Diagnosis not present

## 2017-12-06 DIAGNOSIS — Z23 Encounter for immunization: Secondary | ICD-10-CM | POA: Diagnosis not present

## 2017-12-10 DIAGNOSIS — E1140 Type 2 diabetes mellitus with diabetic neuropathy, unspecified: Secondary | ICD-10-CM | POA: Diagnosis not present

## 2017-12-10 DIAGNOSIS — Z7984 Long term (current) use of oral hypoglycemic drugs: Secondary | ICD-10-CM | POA: Diagnosis not present

## 2017-12-10 DIAGNOSIS — Z7901 Long term (current) use of anticoagulants: Secondary | ICD-10-CM | POA: Diagnosis not present

## 2017-12-17 ENCOUNTER — Other Ambulatory Visit (HOSPITAL_COMMUNITY): Payer: Self-pay

## 2017-12-17 DIAGNOSIS — Z7901 Long term (current) use of anticoagulants: Secondary | ICD-10-CM | POA: Diagnosis not present

## 2017-12-20 ENCOUNTER — Ambulatory Visit (HOSPITAL_COMMUNITY)
Admission: RE | Admit: 2017-12-20 | Discharge: 2017-12-20 | Disposition: A | Discharge status: Home / Self Care | Payer: Medicare Other | Source: Ambulatory Visit | Attending: Nephrology | Admitting: Nephrology

## 2017-12-20 DIAGNOSIS — D631 Anemia in chronic kidney disease: Secondary | ICD-10-CM | POA: Insufficient documentation

## 2017-12-20 DIAGNOSIS — N184 Chronic kidney disease, stage 4 (severe): Secondary | ICD-10-CM | POA: Diagnosis not present

## 2017-12-20 LAB — POCT HEMOGLOBIN-HEMACUE: Hemoglobin: 7.4 g/dL — ABNORMAL LOW (ref 12.0–15.0)

## 2017-12-20 MED ORDER — EPOETIN ALFA-EPBX 10000 UNIT/ML IJ SOLN
10000.0000 [IU] | INTRAMUSCULAR | Status: DC
  Administered 2017-12-20: 10000 [IU] via SUBCUTANEOUS
  Filled 2017-12-20: qty 1

## 2017-12-20 MED ORDER — FERUMOXYTOL INJECTION 510 MG/17 ML
510.0000 mg | INTRAVENOUS | Status: DC
  Administered 2017-12-20: 510 mg via INTRAVENOUS
  Filled 2017-12-20: qty 17

## 2017-12-20 NOTE — Discharge Instructions (Signed)
Ferumoxytol injection °What is this medicine? °FERUMOXYTOL is an iron complex. Iron is used to make healthy red blood cells, which carry oxygen and nutrients throughout the body. This medicine is used to treat iron deficiency anemia in people with chronic kidney disease. °This medicine may be used for other purposes; ask your health care provider or pharmacist if you have questions. °COMMON BRAND NAME(S): Feraheme °What should I tell my health care provider before I take this medicine? °They need to know if you have any of these conditions: °-anemia not caused by low iron levels °-high levels of iron in the blood °-magnetic resonance imaging (MRI) test scheduled °-an unusual or allergic reaction to iron, other medicines, foods, dyes, or preservatives °-pregnant or trying to get pregnant °-breast-feeding °How should I use this medicine? °This medicine is for injection into a vein. It is given by a health care professional in a hospital or clinic setting. °Talk to your pediatrician regarding the use of this medicine in children. Special care may be needed. °Overdosage: If you think you have taken too much of this medicine contact a poison control center or emergency room at once. °NOTE: This medicine is only for you. Do not share this medicine with others. °What if I miss a dose? °It is important not to miss your dose. Call your doctor or health care professional if you are unable to keep an appointment. °What may interact with this medicine? °This medicine may interact with the following medications: °-other iron products °This list may not describe all possible interactions. Give your health care provider a list of all the medicines, herbs, non-prescription drugs, or dietary supplements you use. Also tell them if you smoke, drink alcohol, or use illegal drugs. Some items may interact with your medicine. °What should I watch for while using this medicine? °Visit your doctor or healthcare professional regularly. Tell  your doctor or healthcare professional if your symptoms do not start to get better or if they get worse. You may need blood work done while you are taking this medicine. °You may need to follow a special diet. Talk to your doctor. Foods that contain iron include: whole grains/cereals, dried fruits, beans, or peas, leafy green vegetables, and organ meats (liver, kidney). °What side effects may I notice from receiving this medicine? °Side effects that you should report to your doctor or health care professional as soon as possible: °-allergic reactions like skin rash, itching or hives, swelling of the face, lips, or tongue °-breathing problems °-changes in blood pressure °-feeling faint or lightheaded, falls °-fever or chills °-flushing, sweating, or hot feelings °-swelling of the ankles or feet °Side effects that usually do not require medical attention (report to your doctor or health care professional if they continue or are bothersome): °-diarrhea °-headache °-nausea, vomiting °-stomach pain °This list may not describe all possible side effects. Call your doctor for medical advice about side effects. You may report side effects to FDA at 1-800-FDA-1088. °Where should I keep my medicine? °This drug is given in a hospital or clinic and will not be stored at home. °NOTE: This sheet is a summary. It may not cover all possible information. If you have questions about this medicine, talk to your doctor, pharmacist, or health care provider. °© 2018 [employer]/Gold Standard (2015-03-28 12:41:49) ° ° °Epoetin Alfa injection °What is this medicine? °EPOETIN ALFA (e POE e tin AL fa) helps your body make more red blood cells. This medicine is used to treat anemia caused by chronic kidney   failure, cancer chemotherapy, or HIV-therapy. It may also be used before surgery if you have anemia. °This medicine may be used for other purposes; ask your health care provider or pharmacist if you have questions. °COMMON BRAND NAME(S):  Epogen, Procrit °What should I tell my health care provider before I take this medicine? °They need to know if you have any of these conditions: °-blood clotting disorders °-cancer patient not on chemotherapy °-cystic fibrosis °-heart disease, such as angina or heart failure °-hemoglobin level of 12 g/dL or greater °-high blood pressure °-low levels of folate, iron, or vitamin B12 °-seizures °-an unusual or allergic reaction to erythropoietin, albumin, benzyl alcohol, hamster proteins, other medicines, foods, dyes, or preservatives °-pregnant or trying to get pregnant °-breast-feeding °How should I use this medicine? °This medicine is for injection into a vein or under the skin. It is usually given by a health care professional in a hospital or clinic setting. °If you get this medicine at home, you will be taught how to prepare and give this medicine. Use exactly as directed. Take your medicine at regular intervals. Do not take your medicine more often than directed. °It is important that you put your used needles and syringes in a special sharps container. Do not put them in a trash can. If you do not have a sharps container, call your pharmacist or healthcare provider to get one. °A special MedGuide will be given to you by the pharmacist with each prescription and refill. Be sure to read this information carefully each time. °Talk to your pediatrician regarding the use of this medicine in children. While this drug may be prescribed for selected conditions, precautions do apply. °Overdosage: If you think you have taken too much of this medicine contact a poison control center or emergency room at once. °NOTE: This medicine is only for you. Do not share this medicine with others. °What if I miss a dose? °If you miss a dose, take it as soon as you can. If it is almost time for your next dose, take only that dose. Do not take double or extra doses. °What may interact with this medicine? °Do not take this medicine with  any of the following medications: °-darbepoetin alfa °This list may not describe all possible interactions. Give your health care provider a list of all the medicines, herbs, non-prescription drugs, or dietary supplements you use. Also tell them if you smoke, drink alcohol, or use illegal drugs. Some items may interact with your medicine. °What should I watch for while using this medicine? °Your condition will be monitored carefully while you are receiving this medicine. °You may need blood work done while you are taking this medicine. °What side effects may I notice from receiving this medicine? °Side effects that you should report to your doctor or health care professional as soon as possible: °-allergic reactions like skin rash, itching or hives, swelling of the face, lips, or tongue °-breathing problems °-changes in vision °-chest pain °-confusion, trouble speaking or understanding °-feeling faint or lightheaded, falls °-high blood pressure °-muscle aches or pains °-pain, swelling, warmth in the leg °-rapid weight gain °-severe headaches °-sudden numbness or weakness of the face, arm or leg °-trouble walking, dizziness, loss of balance or coordination °-seizures (convulsions) °-swelling of the ankles, feet, hands °-unusually weak or tired °Side effects that usually do not require medical attention (report to your doctor or health care professional if they continue or are bothersome): °-diarrhea °-fever, chills (flu-like symptoms) °-headaches °-nausea, vomiting °-redness, stinging,   or swelling at site where injected °This list may not describe all possible side effects. Call your doctor for medical advice about side effects. You may report side effects to FDA at 1-800-FDA-1088. °Where should I keep my medicine? °Keep out of the reach of children. °Store in a refrigerator between 2 and 8 degrees C (36 and 46 degrees F). Do not freeze or shake. Throw away any unused portion if using a single-dose vial. Multi-dose  vials can be kept in the refrigerator for up to 21 days after the initial dose. Throw away unused medicine. °NOTE: This sheet is a summary. It may not cover all possible information. If you have questions about this medicine, talk to your doctor, pharmacist, or health care provider. °© 2018 [employer]/Gold Standard (2015-10-14 19:42:31) ° °

## 2017-12-24 DIAGNOSIS — Z7901 Long term (current) use of anticoagulants: Secondary | ICD-10-CM | POA: Diagnosis not present

## 2017-12-28 ENCOUNTER — Encounter (HOSPITAL_COMMUNITY)
Admission: RE | Admit: 2017-12-28 | Discharge: 2017-12-28 | Disposition: A | Discharge status: Home / Self Care | Payer: Medicare Other | Source: Ambulatory Visit | Attending: Nephrology | Admitting: Nephrology

## 2017-12-28 DIAGNOSIS — D631 Anemia in chronic kidney disease: Secondary | ICD-10-CM | POA: Diagnosis not present

## 2017-12-28 DIAGNOSIS — N189 Chronic kidney disease, unspecified: Secondary | ICD-10-CM | POA: Insufficient documentation

## 2017-12-28 MED ORDER — SODIUM CHLORIDE 0.9 % IV SOLN
510.0000 mg | INTRAVENOUS | Status: AC
  Administered 2017-12-28: 510 mg via INTRAVENOUS
  Filled 2017-12-28: qty 17

## 2017-12-31 ENCOUNTER — Other Ambulatory Visit (HOSPITAL_COMMUNITY): Payer: Self-pay | Admitting: *Deleted

## 2017-12-31 DIAGNOSIS — Z7901 Long term (current) use of anticoagulants: Secondary | ICD-10-CM | POA: Diagnosis not present

## 2018-01-03 ENCOUNTER — Ambulatory Visit (HOSPITAL_COMMUNITY)
Admission: RE | Admit: 2018-01-03 | Discharge: 2018-01-03 | Disposition: A | Discharge status: Home / Self Care | Payer: Medicare Other | Source: Ambulatory Visit | Attending: Nephrology | Admitting: Nephrology

## 2018-01-03 DIAGNOSIS — N189 Chronic kidney disease, unspecified: Secondary | ICD-10-CM | POA: Diagnosis not present

## 2018-01-03 DIAGNOSIS — D631 Anemia in chronic kidney disease: Secondary | ICD-10-CM | POA: Diagnosis not present

## 2018-01-03 LAB — POCT HEMOGLOBIN-HEMACUE: Hemoglobin: 10.1 g/dL — ABNORMAL LOW (ref 12.0–15.0)

## 2018-01-03 MED ORDER — EPOETIN ALFA-EPBX 10000 UNIT/ML IJ SOLN
10000.0000 [IU] | INTRAMUSCULAR | Status: DC
  Administered 2018-01-03: 10000 [IU] via SUBCUTANEOUS
  Filled 2018-01-03: qty 1

## 2018-01-04 ENCOUNTER — Encounter (HOSPITAL_COMMUNITY): Payer: Medicare Other

## 2018-01-04 DIAGNOSIS — H3410 Central retinal artery occlusion, unspecified eye: Secondary | ICD-10-CM | POA: Diagnosis not present

## 2018-01-04 DIAGNOSIS — H35372 Puckering of macula, left eye: Secondary | ICD-10-CM | POA: Diagnosis not present

## 2018-01-04 DIAGNOSIS — H26491 Other secondary cataract, right eye: Secondary | ICD-10-CM | POA: Diagnosis not present

## 2018-01-04 DIAGNOSIS — H35033 Hypertensive retinopathy, bilateral: Secondary | ICD-10-CM | POA: Diagnosis not present

## 2018-01-04 DIAGNOSIS — E113291 Type 2 diabetes mellitus with mild nonproliferative diabetic retinopathy without macular edema, right eye: Secondary | ICD-10-CM | POA: Diagnosis not present

## 2018-01-07 DIAGNOSIS — Z7901 Long term (current) use of anticoagulants: Secondary | ICD-10-CM | POA: Diagnosis not present

## 2018-01-12 DIAGNOSIS — Z7901 Long term (current) use of anticoagulants: Secondary | ICD-10-CM | POA: Diagnosis not present

## 2018-01-17 ENCOUNTER — Ambulatory Visit (HOSPITAL_COMMUNITY)
Admission: RE | Admit: 2018-01-17 | Discharge: 2018-01-17 | Disposition: A | Discharge status: Home / Self Care | Payer: Medicare Other | Source: Ambulatory Visit | Attending: Nephrology | Admitting: Nephrology

## 2018-01-17 DIAGNOSIS — N189 Chronic kidney disease, unspecified: Secondary | ICD-10-CM | POA: Diagnosis not present

## 2018-01-17 DIAGNOSIS — D631 Anemia in chronic kidney disease: Secondary | ICD-10-CM | POA: Diagnosis not present

## 2018-01-17 LAB — IRON AND TIBC
Iron: 67 ug/dL (ref 28–170)
SATURATION RATIOS: 22 % (ref 10.4–31.8)
TIBC: 305 ug/dL (ref 250–450)
UIBC: 238 ug/dL

## 2018-01-17 LAB — MAGNESIUM: MAGNESIUM: 2.3 mg/dL (ref 1.7–2.4)

## 2018-01-17 LAB — CBC WITH DIFFERENTIAL/PLATELET
BASOS PCT: 1 %
Basophils Absolute: 0.1 10*3/uL (ref 0.0–0.1)
Eosinophils Absolute: 0.2 10*3/uL (ref 0.0–0.5)
Eosinophils Relative: 4 %
HCT: 38.5 % (ref 36.0–46.0)
Hemoglobin: 11.5 g/dL — ABNORMAL LOW (ref 12.0–15.0)
Lymphocytes Relative: 10 %
Lymphs Abs: 0.5 10*3/uL — ABNORMAL LOW (ref 0.7–4.0)
MCH: 25.7 pg — AB (ref 26.0–34.0)
MCHC: 29.9 g/dL — AB (ref 30.0–36.0)
MCV: 86.1 fL (ref 80.0–100.0)
MONOS PCT: 4 %
Monocytes Absolute: 0.2 10*3/uL (ref 0.1–1.0)
NEUTROS PCT: 81 %
NRBC: 0 /100{WBCs}
Neutro Abs: 4.3 10*3/uL (ref 1.7–7.7)
Platelets: 211 10*3/uL (ref 150–400)
RBC: 4.47 MIL/uL (ref 3.87–5.11)
RDW: 23 % — AB (ref 11.5–15.5)
WBC: 5.3 10*3/uL (ref 4.0–10.5)
nRBC: 0 % (ref 0.0–0.2)

## 2018-01-17 LAB — FERRITIN: Ferritin: 108 ng/mL (ref 11–307)

## 2018-01-17 LAB — RENAL FUNCTION PANEL
ANION GAP: 13 (ref 5–15)
Albumin: 3.6 g/dL (ref 3.5–5.0)
BUN: 67 mg/dL — ABNORMAL HIGH (ref 8–23)
CHLORIDE: 99 mmol/L (ref 98–111)
CO2: 22 mmol/L (ref 22–32)
Calcium: 8.9 mg/dL (ref 8.9–10.3)
Creatinine, Ser: 2.96 mg/dL — ABNORMAL HIGH (ref 0.44–1.00)
GFR, EST AFRICAN AMERICAN: 16 mL/min — AB (ref >60)
GFR, EST NON AFRICAN AMERICAN: 14 mL/min — AB (ref >60)
Glucose, Bld: 290 mg/dL — ABNORMAL HIGH (ref 70–99)
POTASSIUM: 3.8 mmol/L (ref 3.5–5.1)
Phosphorus: 4.5 mg/dL (ref 2.5–4.6)
Sodium: 134 mmol/L — ABNORMAL LOW (ref 135–145)

## 2018-01-17 LAB — POCT HEMOGLOBIN-HEMACUE: HEMOGLOBIN: 12 g/dL (ref 12.0–15.0)

## 2018-01-17 MED ORDER — EPOETIN ALFA-EPBX 10000 UNIT/ML IJ SOLN
10000.0000 [IU] | Freq: Once | INTRAMUSCULAR | Status: AC
  Administered 2018-01-17: 10000 [IU] via SUBCUTANEOUS
  Filled 2018-01-17: qty 1

## 2018-01-26 DIAGNOSIS — Z7901 Long term (current) use of anticoagulants: Secondary | ICD-10-CM | POA: Diagnosis not present

## 2018-01-31 ENCOUNTER — Ambulatory Visit (HOSPITAL_COMMUNITY)
Admission: RE | Admit: 2018-01-31 | Discharge: 2018-01-31 | Disposition: A | Discharge status: Home / Self Care | Payer: Medicare Other | Source: Ambulatory Visit | Attending: Nephrology | Admitting: Nephrology

## 2018-01-31 VITALS — BP 144/46 | HR 57 | Temp 97.7°F | Resp 18

## 2018-01-31 DIAGNOSIS — D631 Anemia in chronic kidney disease: Secondary | ICD-10-CM | POA: Diagnosis not present

## 2018-01-31 DIAGNOSIS — N184 Chronic kidney disease, stage 4 (severe): Secondary | ICD-10-CM | POA: Diagnosis not present

## 2018-01-31 LAB — POCT HEMOGLOBIN-HEMACUE: HEMOGLOBIN: 12.2 g/dL (ref 12.0–15.0)

## 2018-01-31 MED ORDER — EPOETIN ALFA-EPBX 10000 UNIT/ML IJ SOLN: 10000.0000 [IU] | INTRAMUSCULAR | Status: DC

## 2018-02-07 DIAGNOSIS — I10 Essential (primary) hypertension: Secondary | ICD-10-CM | POA: Diagnosis not present

## 2018-02-07 DIAGNOSIS — Z7901 Long term (current) use of anticoagulants: Secondary | ICD-10-CM | POA: Diagnosis not present

## 2018-02-14 ENCOUNTER — Ambulatory Visit (HOSPITAL_COMMUNITY)
Admission: RE | Admit: 2018-02-14 | Discharge: 2018-02-14 | Disposition: A | Discharge status: Home / Self Care | Payer: Medicare Other | Source: Ambulatory Visit | Attending: Nephrology | Admitting: Nephrology

## 2018-02-14 VITALS — BP 140/44 | HR 52 | Resp 18

## 2018-02-14 DIAGNOSIS — D631 Anemia in chronic kidney disease: Secondary | ICD-10-CM | POA: Diagnosis not present

## 2018-02-14 DIAGNOSIS — N184 Chronic kidney disease, stage 4 (severe): Secondary | ICD-10-CM | POA: Diagnosis not present

## 2018-02-14 LAB — CBC WITH DIFFERENTIAL/PLATELET
Abs Immature Granulocytes: 0.06 10*3/uL (ref 0.00–0.07)
BASOS ABS: 0.1 10*3/uL (ref 0.0–0.1)
Basophils Relative: 1 %
EOS ABS: 0.3 10*3/uL (ref 0.0–0.5)
EOS PCT: 4 %
HEMATOCRIT: 37.5 % (ref 36.0–46.0)
Hemoglobin: 11.6 g/dL — ABNORMAL LOW (ref 12.0–15.0)
Immature Granulocytes: 1 %
LYMPHS ABS: 0.8 10*3/uL (ref 0.7–4.0)
Lymphocytes Relative: 12 %
MCH: 27.4 pg (ref 26.0–34.0)
MCHC: 30.9 g/dL (ref 30.0–36.0)
MCV: 88.4 fL (ref 80.0–100.0)
Monocytes Absolute: 0.6 10*3/uL (ref 0.1–1.0)
Monocytes Relative: 9 %
NRBC: 0 % (ref 0.0–0.2)
Neutro Abs: 4.8 10*3/uL (ref 1.7–7.7)
Neutrophils Relative %: 73 %
Platelets: 215 10*3/uL (ref 150–400)
RBC: 4.24 MIL/uL (ref 3.87–5.11)
RDW: 18.5 % — ABNORMAL HIGH (ref 11.5–15.5)
WBC: 6.5 10*3/uL (ref 4.0–10.5)

## 2018-02-14 LAB — IRON AND TIBC
Iron: 48 ug/dL (ref 28–170)
Saturation Ratios: 16 % (ref 10.4–31.8)
TIBC: 294 ug/dL (ref 250–450)
UIBC: 246 ug/dL

## 2018-02-14 LAB — RENAL FUNCTION PANEL
Albumin: 3.5 g/dL (ref 3.5–5.0)
Anion gap: 15 (ref 5–15)
BUN: 54 mg/dL — AB (ref 8–23)
CALCIUM: 9.1 mg/dL (ref 8.9–10.3)
CO2: 25 mmol/L (ref 22–32)
CREATININE: 2.69 mg/dL — AB (ref 0.44–1.00)
Chloride: 97 mmol/L — ABNORMAL LOW (ref 98–111)
GFR calc non Af Amer: 16 mL/min — ABNORMAL LOW (ref >60)
GFR, EST AFRICAN AMERICAN: 18 mL/min — AB (ref >60)
GLUCOSE: 247 mg/dL — AB (ref 70–99)
Phosphorus: 3.7 mg/dL (ref 2.5–4.6)
Potassium: 3.8 mmol/L (ref 3.5–5.1)
SODIUM: 137 mmol/L (ref 135–145)

## 2018-02-14 LAB — FERRITIN: FERRITIN: 54 ng/mL (ref 11–307)

## 2018-02-14 LAB — MAGNESIUM: Magnesium: 2.2 mg/dL (ref 1.7–2.4)

## 2018-02-14 MED ORDER — EPOETIN ALFA-EPBX 10000 UNIT/ML IJ SOLN
10000.0000 [IU] | INTRAMUSCULAR | Status: DC
  Administered 2018-02-14: 10000 [IU] via SUBCUTANEOUS
  Filled 2018-02-14: qty 1

## 2018-02-17 DIAGNOSIS — I129 Hypertensive chronic kidney disease with stage 1 through stage 4 chronic kidney disease, or unspecified chronic kidney disease: Secondary | ICD-10-CM | POA: Diagnosis not present

## 2018-02-17 DIAGNOSIS — D631 Anemia in chronic kidney disease: Secondary | ICD-10-CM | POA: Diagnosis not present

## 2018-02-17 DIAGNOSIS — N184 Chronic kidney disease, stage 4 (severe): Secondary | ICD-10-CM | POA: Diagnosis not present

## 2018-02-17 DIAGNOSIS — N2581 Secondary hyperparathyroidism of renal origin: Secondary | ICD-10-CM | POA: Diagnosis not present

## 2018-02-25 ENCOUNTER — Other Ambulatory Visit (HOSPITAL_COMMUNITY): Payer: Self-pay | Admitting: *Deleted

## 2018-02-28 ENCOUNTER — Ambulatory Visit (HOSPITAL_COMMUNITY)
Admission: RE | Admit: 2018-02-28 | Discharge: 2018-02-28 | Disposition: A | Discharge status: Home / Self Care | Payer: Medicare Other | Source: Ambulatory Visit | Attending: Nephrology | Admitting: Nephrology

## 2018-02-28 VITALS — BP 170/48 | HR 61 | Temp 98.6°F | Resp 20 | Ht 66.0 in | Wt 156.0 lb

## 2018-02-28 DIAGNOSIS — N184 Chronic kidney disease, stage 4 (severe): Secondary | ICD-10-CM | POA: Diagnosis not present

## 2018-02-28 DIAGNOSIS — D631 Anemia in chronic kidney disease: Secondary | ICD-10-CM | POA: Diagnosis not present

## 2018-02-28 LAB — POCT HEMOGLOBIN-HEMACUE: HEMOGLOBIN: 12.7 g/dL (ref 12.0–15.0)

## 2018-02-28 MED ORDER — EPOETIN ALFA-EPBX 10000 UNIT/ML IJ SOLN: 10000.0000 [IU] | INTRAMUSCULAR | Status: DC

## 2018-02-28 MED ORDER — SODIUM CHLORIDE 0.9 % IV SOLN
510.0000 mg | INTRAVENOUS | Status: DC
  Administered 2018-02-28: 510 mg via INTRAVENOUS
  Filled 2018-02-28: qty 17

## 2018-03-04 ENCOUNTER — Other Ambulatory Visit (HOSPITAL_COMMUNITY): Payer: Self-pay | Admitting: *Deleted

## 2018-03-07 ENCOUNTER — Ambulatory Visit (HOSPITAL_COMMUNITY)
Admission: RE | Admit: 2018-03-07 | Discharge: 2018-03-07 | Disposition: A | Discharge status: Home / Self Care | Payer: Medicare Other | Source: Ambulatory Visit | Attending: Nephrology | Admitting: Nephrology

## 2018-03-07 DIAGNOSIS — D631 Anemia in chronic kidney disease: Secondary | ICD-10-CM | POA: Diagnosis not present

## 2018-03-07 DIAGNOSIS — N184 Chronic kidney disease, stage 4 (severe): Secondary | ICD-10-CM | POA: Insufficient documentation

## 2018-03-07 MED ORDER — SODIUM CHLORIDE 0.9 % IV SOLN
510.0000 mg | INTRAVENOUS | Status: AC
  Administered 2018-03-07: 510 mg via INTRAVENOUS
  Filled 2018-03-07: qty 17

## 2018-03-14 ENCOUNTER — Ambulatory Visit (HOSPITAL_COMMUNITY)
Admission: RE | Admit: 2018-03-14 | Discharge: 2018-03-14 | Disposition: A | Discharge status: Home / Self Care | Payer: Medicare Other | Source: Ambulatory Visit | Attending: Nephrology | Admitting: Nephrology

## 2018-03-14 VITALS — BP 152/44 | HR 69 | Temp 98.0°F | Resp 20

## 2018-03-14 DIAGNOSIS — N184 Chronic kidney disease, stage 4 (severe): Secondary | ICD-10-CM | POA: Diagnosis not present

## 2018-03-14 DIAGNOSIS — D631 Anemia in chronic kidney disease: Secondary | ICD-10-CM | POA: Insufficient documentation

## 2018-03-14 LAB — CBC WITH DIFFERENTIAL/PLATELET
Abs Immature Granulocytes: 0.1 10*3/uL — ABNORMAL HIGH (ref 0.00–0.07)
Basophils Absolute: 0.1 10*3/uL (ref 0.0–0.1)
Basophils Relative: 1 %
EOS PCT: 3 %
Eosinophils Absolute: 0.2 10*3/uL (ref 0.0–0.5)
HCT: 39.7 % (ref 36.0–46.0)
Hemoglobin: 12.9 g/dL (ref 12.0–15.0)
Immature Granulocytes: 1 %
Lymphocytes Relative: 14 %
Lymphs Abs: 1 10*3/uL (ref 0.7–4.0)
MCH: 29 pg (ref 26.0–34.0)
MCHC: 32.5 g/dL (ref 30.0–36.0)
MCV: 89.2 fL (ref 80.0–100.0)
Monocytes Absolute: 0.5 10*3/uL (ref 0.1–1.0)
Monocytes Relative: 7 %
Neutro Abs: 5.4 10*3/uL (ref 1.7–7.7)
Neutrophils Relative %: 74 %
PLATELETS: 205 10*3/uL (ref 150–400)
RBC: 4.45 MIL/uL (ref 3.87–5.11)
RDW: 16 % — AB (ref 11.5–15.5)
WBC: 7.2 10*3/uL (ref 4.0–10.5)
nRBC: 0 % (ref 0.0–0.2)

## 2018-03-14 LAB — RENAL FUNCTION PANEL
Albumin: 3.6 g/dL (ref 3.5–5.0)
Anion gap: 14 (ref 5–15)
BUN: 56 mg/dL — ABNORMAL HIGH (ref 8–23)
CO2: 22 mmol/L (ref 22–32)
Calcium: 9.1 mg/dL (ref 8.9–10.3)
Chloride: 98 mmol/L (ref 98–111)
Creatinine, Ser: 2.59 mg/dL — ABNORMAL HIGH (ref 0.44–1.00)
GFR calc Af Amer: 19 mL/min — ABNORMAL LOW (ref >60)
GFR calc non Af Amer: 16 mL/min — ABNORMAL LOW (ref >60)
GLUCOSE: 345 mg/dL — AB (ref 70–99)
Phosphorus: 4.1 mg/dL (ref 2.5–4.6)
Potassium: 3.9 mmol/L (ref 3.5–5.1)
Sodium: 134 mmol/L — ABNORMAL LOW (ref 135–145)

## 2018-03-14 LAB — MAGNESIUM: Magnesium: 2.3 mg/dL (ref 1.7–2.4)

## 2018-03-14 LAB — IRON AND TIBC
Iron: 93 ug/dL (ref 28–170)
Saturation Ratios: 34 % — ABNORMAL HIGH (ref 10.4–31.8)
TIBC: 274 ug/dL (ref 250–450)
UIBC: 181 ug/dL

## 2018-03-14 LAB — POCT HEMOGLOBIN-HEMACUE: Hemoglobin: 13.1 g/dL (ref 12.0–15.0)

## 2018-03-14 LAB — FERRITIN: Ferritin: 628 ng/mL — ABNORMAL HIGH (ref 11–307)

## 2018-03-14 MED ORDER — EPOETIN ALFA-EPBX 10000 UNIT/ML IJ SOLN
10000.0000 [IU] | INTRAMUSCULAR | Status: DC
  Filled 2018-03-14: qty 1

## 2018-03-21 ENCOUNTER — Encounter (HOSPITAL_COMMUNITY): Payer: Medicare Other

## 2018-03-25 DIAGNOSIS — Z7901 Long term (current) use of anticoagulants: Secondary | ICD-10-CM | POA: Diagnosis not present

## 2018-03-25 DIAGNOSIS — E1140 Type 2 diabetes mellitus with diabetic neuropathy, unspecified: Secondary | ICD-10-CM | POA: Diagnosis not present

## 2018-03-28 ENCOUNTER — Inpatient Hospital Stay (HOSPITAL_COMMUNITY): Admission: RE | Admit: 2018-03-28 | Payer: Medicare Other | Source: Ambulatory Visit

## 2018-03-30 ENCOUNTER — Ambulatory Visit (HOSPITAL_COMMUNITY)
Admission: RE | Admit: 2018-03-30 | Discharge: 2018-03-30 | Disposition: A | Discharge status: Home / Self Care | Payer: Medicare Other | Source: Ambulatory Visit | Attending: Nephrology | Admitting: Nephrology

## 2018-03-30 VITALS — BP 139/52 | HR 71 | Temp 98.8°F | Resp 20

## 2018-03-30 DIAGNOSIS — D631 Anemia in chronic kidney disease: Secondary | ICD-10-CM | POA: Diagnosis not present

## 2018-03-30 DIAGNOSIS — N184 Chronic kidney disease, stage 4 (severe): Secondary | ICD-10-CM | POA: Diagnosis not present

## 2018-03-30 LAB — POCT HEMOGLOBIN-HEMACUE: Hemoglobin: 12.6 g/dL (ref 12.0–15.0)

## 2018-03-30 MED ORDER — EPOETIN ALFA-EPBX 10000 UNIT/ML IJ SOLN
10000.0000 [IU] | INTRAMUSCULAR | Status: DC
  Filled 2018-03-30: qty 1

## 2018-04-08 DIAGNOSIS — Z7901 Long term (current) use of anticoagulants: Secondary | ICD-10-CM | POA: Diagnosis not present

## 2018-04-11 ENCOUNTER — Encounter (HOSPITAL_COMMUNITY): Payer: Medicare Other

## 2018-04-13 ENCOUNTER — Ambulatory Visit (HOSPITAL_COMMUNITY)
Admission: RE | Admit: 2018-04-13 | Discharge: 2018-04-13 | Disposition: A | Discharge status: Home / Self Care | Payer: Medicare Other | Source: Ambulatory Visit | Attending: Nephrology | Admitting: Nephrology

## 2018-04-13 VITALS — BP 140/44 | HR 71 | Temp 98.0°F | Resp 20

## 2018-04-13 DIAGNOSIS — N184 Chronic kidney disease, stage 4 (severe): Secondary | ICD-10-CM | POA: Insufficient documentation

## 2018-04-13 DIAGNOSIS — D631 Anemia in chronic kidney disease: Secondary | ICD-10-CM | POA: Insufficient documentation

## 2018-04-13 LAB — RENAL FUNCTION PANEL
Albumin: 3.6 g/dL (ref 3.5–5.0)
Anion gap: 16 — ABNORMAL HIGH (ref 5–15)
BUN: 49 mg/dL — AB (ref 8–23)
CO2: 21 mmol/L — ABNORMAL LOW (ref 22–32)
Calcium: 9 mg/dL (ref 8.9–10.3)
Chloride: 99 mmol/L (ref 98–111)
Creatinine, Ser: 2.63 mg/dL — ABNORMAL HIGH (ref 0.44–1.00)
GFR calc Af Amer: 19 mL/min — ABNORMAL LOW (ref >60)
GFR calc non Af Amer: 16 mL/min — ABNORMAL LOW (ref >60)
Glucose, Bld: 297 mg/dL — ABNORMAL HIGH (ref 70–99)
Phosphorus: 4.1 mg/dL (ref 2.5–4.6)
Potassium: 3.8 mmol/L (ref 3.5–5.1)
Sodium: 136 mmol/L (ref 135–145)

## 2018-04-13 LAB — CBC WITH DIFFERENTIAL/PLATELET
Abs Immature Granulocytes: 0.04 10*3/uL (ref 0.00–0.07)
BASOS ABS: 0.1 10*3/uL (ref 0.0–0.1)
Basophils Relative: 1 %
Eosinophils Absolute: 0.2 10*3/uL (ref 0.0–0.5)
Eosinophils Relative: 3 %
HCT: 38.9 % (ref 36.0–46.0)
Hemoglobin: 12.5 g/dL (ref 12.0–15.0)
Immature Granulocytes: 1 %
Lymphocytes Relative: 8 %
Lymphs Abs: 0.5 10*3/uL — ABNORMAL LOW (ref 0.7–4.0)
MCH: 29.7 pg (ref 26.0–34.0)
MCHC: 32.1 g/dL (ref 30.0–36.0)
MCV: 92.4 fL (ref 80.0–100.0)
Monocytes Absolute: 0.5 10*3/uL (ref 0.1–1.0)
Monocytes Relative: 7 %
NEUTROS PCT: 80 %
Neutro Abs: 5.4 10*3/uL (ref 1.7–7.7)
Platelets: 201 10*3/uL (ref 150–400)
RBC: 4.21 MIL/uL (ref 3.87–5.11)
RDW: 13.7 % (ref 11.5–15.5)
WBC: 6.7 10*3/uL (ref 4.0–10.5)
nRBC: 0 % (ref 0.0–0.2)

## 2018-04-13 LAB — IRON AND TIBC
Iron: 74 ug/dL (ref 28–170)
Saturation Ratios: 28 % (ref 10.4–31.8)
TIBC: 262 ug/dL (ref 250–450)
UIBC: 188 ug/dL

## 2018-04-13 LAB — FERRITIN: Ferritin: 310 ng/mL — ABNORMAL HIGH (ref 11–307)

## 2018-04-13 LAB — MAGNESIUM: Magnesium: 1.9 mg/dL (ref 1.7–2.4)

## 2018-04-13 LAB — POCT HEMOGLOBIN-HEMACUE: Hemoglobin: 12.7 g/dL (ref 12.0–15.0)

## 2018-04-13 MED ORDER — EPOETIN ALFA-EPBX 10000 UNIT/ML IJ SOLN
10000.0000 [IU] | INTRAMUSCULAR | Status: DC
  Filled 2018-04-13: qty 1

## 2018-04-22 DIAGNOSIS — Z7901 Long term (current) use of anticoagulants: Secondary | ICD-10-CM | POA: Diagnosis not present

## 2018-04-27 ENCOUNTER — Ambulatory Visit (HOSPITAL_COMMUNITY)
Admission: RE | Admit: 2018-04-27 | Discharge: 2018-04-27 | Disposition: A | Discharge status: Home / Self Care | Payer: Medicare Other | Source: Ambulatory Visit | Attending: Nephrology | Admitting: Nephrology

## 2018-04-27 VITALS — BP 138/49 | HR 63 | Temp 97.9°F | Resp 63

## 2018-04-27 DIAGNOSIS — N184 Chronic kidney disease, stage 4 (severe): Secondary | ICD-10-CM | POA: Diagnosis not present

## 2018-04-27 DIAGNOSIS — D631 Anemia in chronic kidney disease: Secondary | ICD-10-CM | POA: Insufficient documentation

## 2018-04-27 LAB — POCT HEMOGLOBIN-HEMACUE: Hemoglobin: 12.4 g/dL (ref 12.0–15.0)

## 2018-04-27 MED ORDER — EPOETIN ALFA-EPBX 10000 UNIT/ML IJ SOLN
10000.0000 [IU] | INTRAMUSCULAR | Status: DC
  Filled 2018-04-27: qty 1

## 2018-05-09 DIAGNOSIS — I10 Essential (primary) hypertension: Secondary | ICD-10-CM | POA: Diagnosis not present

## 2018-05-09 DIAGNOSIS — Z7901 Long term (current) use of anticoagulants: Secondary | ICD-10-CM | POA: Diagnosis not present

## 2018-05-11 ENCOUNTER — Encounter (HOSPITAL_COMMUNITY): Payer: Medicare Other

## 2018-05-13 ENCOUNTER — Other Ambulatory Visit: Payer: Self-pay

## 2018-05-13 ENCOUNTER — Ambulatory Visit (HOSPITAL_COMMUNITY)
Admission: RE | Admit: 2018-05-13 | Discharge: 2018-05-13 | Disposition: A | Discharge status: Home / Self Care | Payer: Medicare Other | Source: Ambulatory Visit | Attending: Nephrology | Admitting: Nephrology

## 2018-05-13 VITALS — BP 142/45 | HR 60 | Temp 97.9°F | Resp 20

## 2018-05-13 DIAGNOSIS — D631 Anemia in chronic kidney disease: Secondary | ICD-10-CM | POA: Insufficient documentation

## 2018-05-13 DIAGNOSIS — N184 Chronic kidney disease, stage 4 (severe): Secondary | ICD-10-CM | POA: Diagnosis not present

## 2018-05-13 LAB — CBC WITH DIFFERENTIAL/PLATELET
ABS IMMATURE GRANULOCYTES: 0.06 10*3/uL (ref 0.00–0.07)
Basophils Absolute: 0 10*3/uL (ref 0.0–0.1)
Basophils Relative: 1 %
Eosinophils Absolute: 0.2 10*3/uL (ref 0.0–0.5)
Eosinophils Relative: 3 %
HCT: 38.3 % (ref 36.0–46.0)
Hemoglobin: 12.4 g/dL (ref 12.0–15.0)
IMMATURE GRANULOCYTES: 1 %
LYMPHS PCT: 11 %
Lymphs Abs: 0.6 10*3/uL — ABNORMAL LOW (ref 0.7–4.0)
MCH: 30 pg (ref 26.0–34.0)
MCHC: 32.4 g/dL (ref 30.0–36.0)
MCV: 92.7 fL (ref 80.0–100.0)
Monocytes Absolute: 0.4 10*3/uL (ref 0.1–1.0)
Monocytes Relative: 7 %
NEUTROS ABS: 4.5 10*3/uL (ref 1.7–7.7)
NEUTROS PCT: 77 %
Platelets: 190 10*3/uL (ref 150–400)
RBC: 4.13 MIL/uL (ref 3.87–5.11)
RDW: 12.1 % (ref 11.5–15.5)
WBC: 5.8 10*3/uL (ref 4.0–10.5)
nRBC: 0 % (ref 0.0–0.2)

## 2018-05-13 LAB — RENAL FUNCTION PANEL
Albumin: 3.4 g/dL — ABNORMAL LOW (ref 3.5–5.0)
Anion gap: 12 (ref 5–15)
BUN: 42 mg/dL — ABNORMAL HIGH (ref 8–23)
CO2: 25 mmol/L (ref 22–32)
Calcium: 9.1 mg/dL (ref 8.9–10.3)
Chloride: 99 mmol/L (ref 98–111)
Creatinine, Ser: 2.57 mg/dL — ABNORMAL HIGH (ref 0.44–1.00)
GFR calc non Af Amer: 17 mL/min — ABNORMAL LOW (ref >60)
GFR, EST AFRICAN AMERICAN: 19 mL/min — AB (ref >60)
Glucose, Bld: 304 mg/dL — ABNORMAL HIGH (ref 70–99)
Phosphorus: 3.1 mg/dL (ref 2.5–4.6)
Potassium: 3.8 mmol/L (ref 3.5–5.1)
Sodium: 136 mmol/L (ref 135–145)

## 2018-05-13 LAB — MAGNESIUM: Magnesium: 2.1 mg/dL (ref 1.7–2.4)

## 2018-05-13 LAB — IRON AND TIBC
Iron: 60 ug/dL (ref 28–170)
Saturation Ratios: 24 % (ref 10.4–31.8)
TIBC: 252 ug/dL (ref 250–450)
UIBC: 192 ug/dL

## 2018-05-13 LAB — POCT HEMOGLOBIN-HEMACUE: Hemoglobin: 12.6 g/dL (ref 12.0–15.0)

## 2018-05-13 LAB — FERRITIN: Ferritin: 181 ng/mL (ref 11–307)

## 2018-05-13 MED ORDER — EPOETIN ALFA-EPBX 10000 UNIT/ML IJ SOLN
10000.0000 [IU] | INTRAMUSCULAR | Status: DC
  Filled 2018-05-13: qty 1

## 2018-05-20 DIAGNOSIS — H01003 Unspecified blepharitis right eye, unspecified eyelid: Secondary | ICD-10-CM | POA: Diagnosis not present

## 2018-05-20 DIAGNOSIS — E113291 Type 2 diabetes mellitus with mild nonproliferative diabetic retinopathy without macular edema, right eye: Secondary | ICD-10-CM | POA: Diagnosis not present

## 2018-05-20 DIAGNOSIS — H04123 Dry eye syndrome of bilateral lacrimal glands: Secondary | ICD-10-CM | POA: Diagnosis not present

## 2018-05-20 DIAGNOSIS — H02722 Madarosis of right lower eyelid and periocular area: Secondary | ICD-10-CM | POA: Diagnosis not present

## 2018-05-25 ENCOUNTER — Encounter (HOSPITAL_COMMUNITY): Payer: Medicare Other

## 2018-05-27 ENCOUNTER — Encounter (HOSPITAL_COMMUNITY): Payer: Medicare Other

## 2018-06-10 ENCOUNTER — Encounter (HOSPITAL_COMMUNITY): Payer: Medicare Other

## 2018-07-15 DIAGNOSIS — I4891 Unspecified atrial fibrillation: Secondary | ICD-10-CM | POA: Diagnosis not present

## 2018-07-15 DIAGNOSIS — J439 Emphysema, unspecified: Secondary | ICD-10-CM | POA: Diagnosis not present

## 2018-07-15 DIAGNOSIS — F3342 Major depressive disorder, recurrent, in full remission: Secondary | ICD-10-CM | POA: Diagnosis not present

## 2018-07-15 DIAGNOSIS — I350 Nonrheumatic aortic (valve) stenosis: Secondary | ICD-10-CM | POA: Diagnosis not present

## 2018-07-15 DIAGNOSIS — Z7901 Long term (current) use of anticoagulants: Secondary | ICD-10-CM | POA: Diagnosis not present

## 2018-07-15 DIAGNOSIS — I10 Essential (primary) hypertension: Secondary | ICD-10-CM | POA: Diagnosis not present

## 2018-07-15 DIAGNOSIS — I6529 Occlusion and stenosis of unspecified carotid artery: Secondary | ICD-10-CM | POA: Diagnosis not present

## 2018-07-15 DIAGNOSIS — E039 Hypothyroidism, unspecified: Secondary | ICD-10-CM | POA: Diagnosis not present

## 2018-07-15 DIAGNOSIS — I5032 Chronic diastolic (congestive) heart failure: Secondary | ICD-10-CM | POA: Diagnosis not present

## 2018-07-15 DIAGNOSIS — E785 Hyperlipidemia, unspecified: Secondary | ICD-10-CM | POA: Diagnosis not present

## 2018-07-15 DIAGNOSIS — E1140 Type 2 diabetes mellitus with diabetic neuropathy, unspecified: Secondary | ICD-10-CM | POA: Diagnosis not present

## 2018-07-15 DIAGNOSIS — I739 Peripheral vascular disease, unspecified: Secondary | ICD-10-CM | POA: Diagnosis not present

## 2018-07-22 ENCOUNTER — Telehealth: Payer: Self-pay | Admitting: Cardiology

## 2018-07-22 NOTE — Telephone Encounter (Signed)
Virtual Visit Pre-Appointment Phone Call  "(Name), I am calling you today to discuss your upcoming appointment. We are currently trying to limit exposure to the virus that causes COVID-19 by seeing patients at home rather than in the office."  1. "What is the BEST phone number to call the day of the visit?" - include this in appointment notes  2. Do you have or have access to (through a family member/friend) a smartphone with video capability that we can use for your visit?" a. If yes - list this number in appt notes as cell (if different from BEST phone #) and list the appointment type as a VIDEO visit in appointment notes b. If no - list the appointment type as a PHONE visit in appointment notes  3. Confirm consent - "In the setting of the current Covid19 crisis, you are scheduled for a (phone or video) visit with your provider on (date) at (time).  Just as we do with many in-office visits, in order for you to participate in this visit, we must obtain consent.  If you'd like, I can send this to your mychart (if signed up) or email for you to review.  Otherwise, I can obtain your verbal consent now.  All virtual visits are billed to your insurance company just like a normal visit would be.  By agreeing to a virtual visit, we'd like you to understand that the technology does not allow for your provider to perform an examination, and thus may limit your provider's ability to fully assess your condition. If your provider identifies any concerns that need to be evaluated in person, we will make arrangements to do so.  Finally, though the technology is pretty good, we cannot assure that it will always work on either your or our end, and in the setting of a video visit, we may have to convert it to a phone-only visit.  In either situation, we cannot ensure that we have a secure connection.  Are you willing to proceed?" STAFF: Did the patient verbally acknowledge consent to telehealth visit? Document  YES/NO here: *yes/ telephone only [phone] /verbal consent 07/22/18 4. Advise patient to be prepared - "Two hours prior to your appointment, go ahead and check your blood pressure, pulse, oxygen saturation, and your weight (if you have the equipment to check those) and write them all down. When your visit starts, your provider will ask you for this information. If you have an Apple Watch or Kardia device, please plan to have heart rate information ready on the day of your appointment. Please have a pen and paper handy nearby the day of the visit as well."  5. Give patient instructions for MyChart download to smartphone OR Doximity/Doxy.me as below if video visit (depending on what platform provider is using)  6. Inform patient they will receive a phone call 15 minutes prior to their appointment time (may be from unknown caller ID) so they should be prepared to answer    [place] has been deemed a candidate for a follow-up tele-health visit to limit community exposure during the Covid-19 pandemic. I spoke with the patient via phone to ensure availability of phone/video source, confirm preferred email & phone number, and discuss instructions and expectations.  I reminded Idella L Silberman to be prepared with any vital sign and/or heart rhythm information that could potentially be obtained via home monitoring, at the time of her visit. I reminded Epsie L Damman  to expect a phone call prior to her visit.  Armando Gang 07/22/2018 1:40 PM   INSTRUCTIONS FOR DOWNLOADING THE MYCHART APP TO SMARTPHONE  - The patient must first make sure to have activated MyChart and know their login information - If Apple, go to [employer] and type in MyChart in the search bar and download the app. If Android, ask patient to go to [employer] and type in [place] in the search bar and download the app. The app is free but as with any other app downloads, their phone may require them to  verify saved payment information or Apple/Android password.  - The patient will need to then log into the app with their MyChart username and password, and select  as their healthcare provider to link the account. When it is time for your visit, go to the MyChart app, find appointments, and click Begin Video Visit. Be sure to Select Allow for your device to access the Microphone and Camera for your visit. You will then be connected, and your provider will be with you shortly.  **If they have any issues connecting, or need assistance please contact MyChart service desk [phone])**  **If using a computer, in order to ensure the best quality for their visit they will need to use either of the following Internet Browsers: [employer], or Google Chrome**  IF USING DOXIMITY or DOXY.ME - The patient will receive a link just prior to their visit by text.     FULL LENGTH CONSENT FOR TELE-HEALTH VISIT   I hereby voluntarily request, consent and authorize [facility] and its employed or contracted physicians, physician assistants, nurse practitioners or other licensed health care professionals (the Practitioner), to provide me with telemedicine health care services (the Services") as deemed necessary by the treating Practitioner. I acknowledge and consent to receive the Services by the Practitioner via telemedicine. I understand that the telemedicine visit will involve communicating with the Practitioner through live audiovisual communication technology and the disclosure of certain medical information by electronic transmission. I acknowledge that I have been given the opportunity to request an in-person assessment or other available alternative prior to the telemedicine visit and am voluntarily participating in the telemedicine visit.  I understand that I have the right to withhold or withdraw my consent to the use of telemedicine in the course of my care at any time,  without affecting my right to future care or treatment, and that the Practitioner or I may terminate the telemedicine visit at any time. I understand that I have the right to inspect all information obtained and/or recorded in the course of the telemedicine visit and may receive copies of available information for a reasonable fee.  I understand that some of the potential risks of receiving the Services via telemedicine include:   Delay or interruption in medical evaluation due to technological equipment failure or disruption;  Information transmitted may not be sufficient (e.g. poor resolution of images) to allow for appropriate medical decision making by the Practitioner; and/or   In rare instances, security protocols could fail, causing a breach of personal health information.  Furthermore, I acknowledge that it is my responsibility to provide information about my medical history, conditions and care that is complete and accurate to the best of my ability. I acknowledge that Practitioner's advice, recommendations, and/or decision may be based on factors not within their control, such as incomplete or inaccurate data provided by me or distortions of diagnostic images or specimens  that may result from electronic transmissions. I understand that the practice of medicine is not an exact science and that Practitioner makes no warranties or guarantees regarding treatment outcomes. I acknowledge that I will receive a copy of this consent concurrently upon execution via email to the email address I last provided but may also request a printed copy by calling the office of [place].    I understand that my insurance will be billed for this visit.   I have read or had this consent read to me.  I understand the contents of this consent, which adequately explains the benefits and risks of the Services being provided via telemedicine.   I have been provided ample opportunity to ask questions regarding  this consent and the Services and have had my questions answered to my satisfaction.  I give my informed consent for the services to be provided through the use of telemedicine in my medical care  By participating in this telemedicine visit I agree to the above.

## 2018-07-25 ENCOUNTER — Encounter: Payer: Self-pay | Admitting: Cardiology

## 2018-07-25 DIAGNOSIS — E785 Hyperlipidemia, unspecified: Secondary | ICD-10-CM | POA: Insufficient documentation

## 2018-07-25 DIAGNOSIS — N183 Chronic kidney disease, stage 3 unspecified: Secondary | ICD-10-CM | POA: Insufficient documentation

## 2018-07-25 NOTE — Progress Notes (Signed)
Virtual Visit via Telephone Note   This visit type was conducted due to national recommendations for restrictions regarding the COVID-19 Pandemic (e.g. social distancing) in an effort to limit this patient's exposure and mitigate transmission in our community.  Due to her co-morbid illnesses, this patient is at least at moderate risk for complications without adequate follow up.  This format is felt to be most appropriate for this patient at this time.  All issues noted in this document were discussed and addressed.  A limited physical exam was performed with this format.  Please refer to the patient's chart for her consent to telehealth for [facility].   Evaluation Performed:  Follow-up visit  This visit type was conducted due to national recommendations for restrictions regarding the COVID-19 Pandemic (e.g. social distancing).  This format is felt to be most appropriate for this patient at this time.  All issues noted in this document were discussed and addressed.  No physical exam was performed (except for noted visual exam findings with Video Visits).  Please refer to the patient's chart (MyChart message for video visits and phone note for telephone visits) for the patient's consent to telehealth for [facility]  Date:  07/26/2018   ID:  Jackie Alvarez, DOB 1931/12/26, MRN [id number]  Patient Location:  Home  Provider location:   [place]  PCP:  Maurice Small, MD  Cardiologist:  Fransico Him, MD Electrophysiologist:  None   Chief Complaint:  CHF, atrial flutter, AS  History of Present Illness:    Jackie Alvarez is a 85 y.o. female who presents via audio/video conferencing for a telehealth visit today.    Jackie Alvarez is a 85 y.o. female with a hx of chronic diastolic CHF, HTN, CKD stage III, remote atrial flutter and mild AS.  She is here today for followup and is doing well.  She denies any chest pain or pressure, SOB, DOE, PND, orthopnea, LE edema, dizziness, palpitations or  syncope. She is compliant with her meds and is tolerating meds with no SE.    The patient does not have symptoms concerning for COVID-19 infection (fever, chills, cough, or new shortness of breath).    Prior CV studies:   The following studies were reviewed today:  none  Past Medical History:  Diagnosis Date  . Anemia   . Anxiety   . Aortic stenosis 07/01/2016   mild by echo 07/2017  . Atrial flutter ([place]) 2008   remote  . Cancer [facility]) 1994   Left Breast  . Carotid artery stenosis    40-59% right and 1-39% left stenosis by dopplers 09/2017  . Central retinal vein occlusion, right eye   . Cholelithiases   . Chronic diastolic CHF (congestive heart failure) ([place])   . Chronic kidney disease    CKD stage III  . COPD (chronic obstructive pulmonary disease) ([place])   . Depression   . Diabetes mellitus   . GERD (gastroesophageal reflux disease)   . Hiatal hernia   . Hyperlipidemia   . Hypertension   . Obesity   . Peripheral neuropathy   . Pulmonary HTN ([place])    mild PASP 19mmHg by echo 07/2017  . Schatzki's ring   . Thyroid disease    toxic multinodular goiter with history of thyrotoxicosis now s/p RAI ablative therapy - now hypothyroid  . Ulcer 2009   gatric with history of bleeding - resolved   Past Surgical History:  Procedure Laterality Date  . ABDOMINAL AORTAGRAM  Jan.  18, 2013  . ABDOMINAL AORTAGRAM N/A 03/17/2011   Procedure: ABDOMINAL Maxcine Ham;  Surgeon: Serafina Mitchell, MD;  Location: [facility] CATH LAB;  Service: Cardiovascular;  Laterality: N/A;  . ABDOMINAL HYSTERECTOMY     with BSO  . BREAST LUMPECTOMY    . CHOLECYSTECTOMY     Gall Bladder  . HIP SURGERY Left   . PR VEIN BYPASS GRAFT,AORTO-FEM-POP    . WRIST SURGERY  02/2009     Current Meds  Medication Sig  . amLODipine (NORVASC) 10 MG tablet Take 10 mg by mouth daily.    Marland Kitchen atorvastatin (LIPITOR) 40 MG tablet Take 1 tablet (40 mg total) by mouth daily.  . betamethasone dipropionate (DIPROLENE) 0.05 % cream   .  cholecalciferol (VITAMIN D3) 25 MCG (1000 UT) tablet Take 1,000 Units by mouth daily.  . citalopram (CELEXA) 40 MG tablet Take 40 mg by mouth daily.  . cloNIDine (CATAPRES) 0.1 MG tablet Take 0.1 mg by mouth 2 (two) times daily.   . furosemide (LASIX) 40 MG tablet Take 2 (40mg ) tablets by mouth in AM and take 1 tablet in the afternoon  . glipiZIDE (GLUCOTROL) 5 MG tablet Take 5 mg by mouth daily before breakfast.  . hydrALAZINE (APRESOLINE) 100 MG tablet Take 100 mg by mouth 3 (three) times daily.  Marland Kitchen JANUVIA 100 MG tablet Take 50 mg by mouth daily.   Marland Kitchen levothyroxine (SYNTHROID, LEVOTHROID) 88 MCG tablet Take 88 mcg by mouth daily before breakfast.  . mirtazapine (REMERON) 30 MG tablet Take 30 mg by mouth every morning.  . potassium chloride SA (K-DUR,KLOR-CON) 20 MEQ tablet Take 20 mEq by mouth daily.  Marland Kitchen warfarin (COUMADIN) 1 MG tablet Take 1 mg by mouth as directed. Take 2 mg on Sunday, Monday, Wednesday and Friday. 1.5 mg on Tuesday and Thursday     Allergies:   Metformin and related and Omnipaque [iohexol]   Social History   Tobacco Use  . Smoking status: Former Smoker    Packs/day: 1.00    Years: 45.00    Pack years: 45.00    Types: Cigarettes    Last attempt to quit: 01/26/1997    Years since quitting: 21.5  . Smokeless tobacco: Never Used  Substance Use Topics  . Alcohol use: No  . Drug use: No     Family Hx: The patient's family history includes Cancer in her father, mother, and sister; Cirrhosis in her father; Diabetes in her sister.  ROS:   Please see the history of present illness.     All other systems reviewed and are negative.   Labs/Other Tests and Data Reviewed:    Recent Labs: 05/13/2018: BUN 42; Creatinine, Ser 2.57; Hemoglobin 12.4; Hemoglobin 12.6; Magnesium 2.1; Platelets 190; Potassium 3.8; Sodium 136   Recent Lipid Panel Lab Results  Component Value Date/Time   CHOL 150 09/02/2015 11:10 AM   TRIG 170 (H) 09/02/2015 11:10 AM   HDL 58 09/02/2015  11:10 AM   CHOLHDL 2.6 09/02/2015 11:10 AM   LDLCALC 58 09/02/2015 11:10 AM    Wt Readings from Last 3 Encounters:  07/26/18 156 lb (70.8 kg)  02/28/18 156 lb (70.8 kg)  12/28/17 152 lb (68.9 kg)     Objective:    Vital Signs:  Ht 5\' 6"  (1.676 m)   Wt 156 lb (70.8 kg)   BMI 25.18 kg/m     ASSESSMENT & PLAN:    1.  Chronic Diastolic CHF - her weight has been stable.  She has not  had any LE edema or SOB.  She will continue on Lasix 80mg  qam and 40mg  qpm.  Her last creatinine was 2.57 on 05/13/2018.  2.  Hypertension - her BP is controlled at home at 142/75mmHg.  She will continue on Clonidine 0.3mg  BID, amlodipine 10mg  daily, Hydralazine 100mg  TID.    3.  Bilateral carotid artery stenosis - her last carotid dopplers were 07/2017 showing 40-59% right and 1-39% left carotid stenosis.   I will repeat these in  August to make sure there has not been progression.  She will continue on Lipitor 40mg  daily.  She is not on ASA due to warfarin.  4.  Mild aortic stenosis - she had an echo a year ago showing normal LVF with moderately thickened AV leaflets with mean AVG 52mmHg.    5.  Pulmonary hypertension - her last echo a year ago showed mild to moderate pulmonary HTN with PASP 73mmHg.  SHe will continue on Lasix.  I will repeat an echo in August.   6.  Hyperlipidemia - her LDL goal is < 70.  Her last LDL was 57 in 09/2017.  I will repeat an FLP and ALT in August.  She will continue on Atorvastatin 40mg  daily.    7.  COVID-19 Education:The signs and symptoms of COVID-19 were discussed with the patient and how to seek care for testing (follow up with PCP or arrange E-visit).  The importance of social distancing was discussed today.  8.  Paroxysmal atrial flutter - this occurred remotely in 2008.  She has not had any breakthrough of palpitations.  She will continue on warfarin.  She has not had any bleeding problems.   9.  CKD stage 3 - this is followed by her PCP.  Her last creatinine was 2.57  on 05/13/2018.  It has been as high as 4.2 in the past.  Patient Risk:   After full review of this patient's clinical status, I feel that they are at least moderate risk at this time.  Time:   Today, I have spent 15 minutes directly with the patient on telephone discussing medical problems including CHF, HTN, carotid stenosis, AS, pulmonary HTN.  We also reviewed the symptoms of COVID 19 and the ways to protect against contracting the virus with telehealth technology.  I spent an additional 5 minutes reviewing patient's chart including 2D echo and carotid dopplers..  Medication Adjustments/Labs and Tests Ordered: Current medicines are reviewed at length with the patient today.  Concerns regarding medicines are outlined above.  Tests Ordered: No orders of the defined types were placed in this encounter.  Medication Changes: No orders of the defined types were placed in this encounter.   Disposition:  Follow up in 1 year(s)  Signed, Fransico Him, MD  07/26/2018 9:12 AM    [place]

## 2018-07-26 ENCOUNTER — Other Ambulatory Visit: Payer: Self-pay

## 2018-07-26 ENCOUNTER — Telehealth (INDEPENDENT_AMBULATORY_CARE_PROVIDER_SITE_OTHER): Payer: Medicare Other | Admitting: Cardiology

## 2018-07-26 ENCOUNTER — Encounter: Payer: Self-pay | Admitting: Cardiology

## 2018-07-26 VITALS — Ht 66.0 in | Wt 156.0 lb

## 2018-07-26 DIAGNOSIS — I5032 Chronic diastolic (congestive) heart failure: Secondary | ICD-10-CM | POA: Diagnosis not present

## 2018-07-26 DIAGNOSIS — I350 Nonrheumatic aortic (valve) stenosis: Secondary | ICD-10-CM | POA: Diagnosis not present

## 2018-07-26 DIAGNOSIS — I6523 Occlusion and stenosis of bilateral carotid arteries: Secondary | ICD-10-CM | POA: Diagnosis not present

## 2018-07-26 DIAGNOSIS — I272 Pulmonary hypertension, unspecified: Secondary | ICD-10-CM | POA: Diagnosis not present

## 2018-07-26 DIAGNOSIS — N183 Chronic kidney disease, stage 3 unspecified: Secondary | ICD-10-CM

## 2018-07-26 DIAGNOSIS — Z7189 Other specified counseling: Secondary | ICD-10-CM

## 2018-07-26 DIAGNOSIS — R600 Localized edema: Secondary | ICD-10-CM

## 2018-07-26 DIAGNOSIS — I10 Essential (primary) hypertension: Secondary | ICD-10-CM | POA: Diagnosis not present

## 2018-07-26 DIAGNOSIS — I4819 Other persistent atrial fibrillation: Secondary | ICD-10-CM | POA: Diagnosis not present

## 2018-07-26 DIAGNOSIS — E785 Hyperlipidemia, unspecified: Secondary | ICD-10-CM

## 2018-07-26 NOTE — Patient Instructions (Signed)
Medication Instructions:  Your physician recommends that you continue on your current medications as directed. Please refer to the Current Medication list given to you today.  If you need a refill on your cardiac medications before your next appointment, please call your pharmacy.   Lab work: Fasting Labs: Lipid and Liver on 10/10/18  If you have labs (blood work) drawn today and your tests are completely normal, you will receive your results only by: Marland Kitchen MyChart Message (if you have MyChart) OR . A paper copy in the mail If you have any lab test that is abnormal or we need to change your treatment, we will call you to review the results.  Testing/Procedures: Your physician has requested that you have an echocardiogram around 10/2018. Echocardiography is a painless test that uses sound waves to create images of your heart. It provides your doctor with information about the size and shape of your heart and how well your heart's chambers and valves are working. This procedure takes approximately one hour. There are no restrictions for this procedure.  Your physician has requested that you have a carotid duplex around 10/2018 at [place], [address]. This test is an ultrasound of the carotid arteries in your neck. It looks at blood flow through these arteries that supply the brain with blood. Allow one hour for this exam. There are no restrictions or special instructions.  Follow-Up: At [facility], you and your health needs are our priority.  As part of our continuing mission to provide you with exceptional heart care, we have created designated Provider Care Teams.  These Care Teams include your primary Cardiologist (physician) and Advanced Practice Providers (APPs -  Physician Assistants and Nurse Practitioners) who all work together to provide you with the care you need, when you need it. You will need a follow up appointment in 1 years.  Please call our office 2 months in advance to  schedule this appointment.  You may see Dr. [name] or one of the following Advanced Practice Providers on your designated Care Team:   [place], PA-C Melina Copa, PA-C . Ermalinda Barrios, PA-C

## 2018-07-28 DIAGNOSIS — I10 Essential (primary) hypertension: Secondary | ICD-10-CM | POA: Diagnosis not present

## 2018-07-28 DIAGNOSIS — E1140 Type 2 diabetes mellitus with diabetic neuropathy, unspecified: Secondary | ICD-10-CM | POA: Diagnosis not present

## 2018-07-28 DIAGNOSIS — Z7901 Long term (current) use of anticoagulants: Secondary | ICD-10-CM | POA: Diagnosis not present

## 2018-07-28 DIAGNOSIS — E039 Hypothyroidism, unspecified: Secondary | ICD-10-CM | POA: Diagnosis not present

## 2018-07-28 DIAGNOSIS — E785 Hyperlipidemia, unspecified: Secondary | ICD-10-CM | POA: Diagnosis not present

## 2018-07-28 DIAGNOSIS — E559 Vitamin D deficiency, unspecified: Secondary | ICD-10-CM | POA: Diagnosis not present

## 2018-08-31 DIAGNOSIS — Z7901 Long term (current) use of anticoagulants: Secondary | ICD-10-CM | POA: Diagnosis not present

## 2018-09-02 DIAGNOSIS — Z853 Personal history of malignant neoplasm of breast: Secondary | ICD-10-CM | POA: Diagnosis not present

## 2018-09-02 DIAGNOSIS — Z1231 Encounter for screening mammogram for malignant neoplasm of breast: Secondary | ICD-10-CM | POA: Diagnosis not present

## 2018-09-19 ENCOUNTER — Other Ambulatory Visit: Payer: Self-pay | Admitting: Cardiology

## 2018-09-29 DIAGNOSIS — Z853 Personal history of malignant neoplasm of breast: Secondary | ICD-10-CM | POA: Diagnosis not present

## 2018-09-29 DIAGNOSIS — N6341 Unspecified lump in right breast, subareolar: Secondary | ICD-10-CM | POA: Diagnosis not present

## 2018-09-30 DIAGNOSIS — Z7901 Long term (current) use of anticoagulants: Secondary | ICD-10-CM | POA: Diagnosis not present

## 2018-10-10 ENCOUNTER — Other Ambulatory Visit (HOSPITAL_COMMUNITY): Payer: Medicare Other

## 2018-10-10 ENCOUNTER — Other Ambulatory Visit: Payer: Medicare Other

## 2018-10-12 DIAGNOSIS — L821 Other seborrheic keratosis: Secondary | ICD-10-CM | POA: Diagnosis not present

## 2018-10-12 DIAGNOSIS — C441122 Basal cell carcinoma of skin of right lower eyelid, including canthus: Secondary | ICD-10-CM | POA: Diagnosis not present

## 2018-10-12 DIAGNOSIS — D485 Neoplasm of uncertain behavior of skin: Secondary | ICD-10-CM | POA: Diagnosis not present

## 2018-10-17 ENCOUNTER — Other Ambulatory Visit: Payer: Self-pay | Admitting: Cardiology

## 2018-10-17 DIAGNOSIS — I6523 Occlusion and stenosis of bilateral carotid arteries: Secondary | ICD-10-CM

## 2018-10-17 DIAGNOSIS — E785 Hyperlipidemia, unspecified: Secondary | ICD-10-CM

## 2018-10-21 ENCOUNTER — Other Ambulatory Visit: Payer: Self-pay | Admitting: Cardiology

## 2018-10-21 DIAGNOSIS — I6523 Occlusion and stenosis of bilateral carotid arteries: Secondary | ICD-10-CM

## 2018-10-24 ENCOUNTER — Encounter (HOSPITAL_COMMUNITY): Payer: Medicare Other

## 2018-11-02 DIAGNOSIS — Z7901 Long term (current) use of anticoagulants: Secondary | ICD-10-CM | POA: Diagnosis not present

## 2018-11-28 DIAGNOSIS — E039 Hypothyroidism, unspecified: Secondary | ICD-10-CM | POA: Diagnosis not present

## 2018-11-28 DIAGNOSIS — Z23 Encounter for immunization: Secondary | ICD-10-CM | POA: Diagnosis not present

## 2018-11-28 DIAGNOSIS — E559 Vitamin D deficiency, unspecified: Secondary | ICD-10-CM | POA: Diagnosis not present

## 2018-11-28 DIAGNOSIS — E119 Type 2 diabetes mellitus without complications: Secondary | ICD-10-CM | POA: Diagnosis not present

## 2018-11-28 DIAGNOSIS — Z7901 Long term (current) use of anticoagulants: Secondary | ICD-10-CM | POA: Diagnosis not present

## 2018-11-28 DIAGNOSIS — E785 Hyperlipidemia, unspecified: Secondary | ICD-10-CM | POA: Diagnosis not present

## 2018-11-28 DIAGNOSIS — I10 Essential (primary) hypertension: Secondary | ICD-10-CM | POA: Diagnosis not present

## 2018-11-28 DIAGNOSIS — Z0000 Encounter for general adult medical examination without abnormal findings: Secondary | ICD-10-CM | POA: Diagnosis not present

## 2018-12-19 ENCOUNTER — Encounter (HOSPITAL_COMMUNITY): Payer: Medicare Other

## 2018-12-28 ENCOUNTER — Other Ambulatory Visit (HOSPITAL_COMMUNITY): Payer: Self-pay | Admitting: Cardiology

## 2018-12-28 ENCOUNTER — Other Ambulatory Visit: Payer: Self-pay

## 2018-12-28 ENCOUNTER — Ambulatory Visit (HOSPITAL_COMMUNITY)
Admission: RE | Admit: 2018-12-28 | Discharge: 2018-12-28 | Disposition: A | Discharge status: Home / Self Care | Payer: Medicare Other | Source: Ambulatory Visit | Attending: Internal Medicine | Admitting: Internal Medicine

## 2018-12-28 DIAGNOSIS — I6523 Occlusion and stenosis of bilateral carotid arteries: Secondary | ICD-10-CM | POA: Insufficient documentation

## 2018-12-28 DIAGNOSIS — Z7901 Long term (current) use of anticoagulants: Secondary | ICD-10-CM | POA: Diagnosis not present

## 2018-12-29 ENCOUNTER — Telehealth: Payer: Self-pay | Admitting: *Deleted

## 2018-12-29 ENCOUNTER — Encounter: Payer: Self-pay | Admitting: Cardiology

## 2018-12-29 DIAGNOSIS — I6523 Occlusion and stenosis of bilateral carotid arteries: Secondary | ICD-10-CM

## 2018-12-29 NOTE — Telephone Encounter (Signed)
-----   Message from Nuala Alpha, LPN sent at [id number]/03/[id number]  9:37 AM EDT -----  ----- Message ----- From: Sueanne Margarita, MD Sent: 12/29/2018   9:32 AM EDT To: Rebec[facility] [address] Triage  40-59% bilateral carotid stenosis - repeat scan in 1 year

## 2018-12-29 NOTE — Telephone Encounter (Signed)
Pt has been notified of carotid results by phone with verbal understanding. Pt agreeable to repeat carotids in 1 yr. I will place order in Epic. Patient notified of result.  Please refer to phone note from today for complete details.   Julaine Hua, [place] 12/29/2018 9:50 AM

## 2019-01-14 ENCOUNTER — Other Ambulatory Visit: Payer: Self-pay | Admitting: Cardiology

## 2019-02-08 DIAGNOSIS — H903 Sensorineural hearing loss, bilateral: Secondary | ICD-10-CM | POA: Diagnosis not present

## 2019-02-08 DIAGNOSIS — H6123 Impacted cerumen, bilateral: Secondary | ICD-10-CM | POA: Diagnosis not present

## 2019-02-08 DIAGNOSIS — H838X3 Other specified diseases of inner ear, bilateral: Secondary | ICD-10-CM | POA: Diagnosis not present

## 2019-02-13 DIAGNOSIS — Z7901 Long term (current) use of anticoagulants: Secondary | ICD-10-CM | POA: Diagnosis not present

## 2019-04-14 DIAGNOSIS — E039 Hypothyroidism, unspecified: Secondary | ICD-10-CM | POA: Diagnosis not present

## 2019-04-14 DIAGNOSIS — E1140 Type 2 diabetes mellitus with diabetic neuropathy, unspecified: Secondary | ICD-10-CM | POA: Diagnosis not present

## 2019-04-14 DIAGNOSIS — I10 Essential (primary) hypertension: Secondary | ICD-10-CM | POA: Diagnosis not present

## 2019-04-14 DIAGNOSIS — Z7901 Long term (current) use of anticoagulants: Secondary | ICD-10-CM | POA: Diagnosis not present

## 2019-04-14 DIAGNOSIS — E119 Type 2 diabetes mellitus without complications: Secondary | ICD-10-CM | POA: Diagnosis not present

## 2019-04-14 DIAGNOSIS — E785 Hyperlipidemia, unspecified: Secondary | ICD-10-CM | POA: Diagnosis not present

## 2019-04-17 ENCOUNTER — Other Ambulatory Visit: Payer: Self-pay | Admitting: Cardiology

## 2019-04-20 DIAGNOSIS — N184 Chronic kidney disease, stage 4 (severe): Secondary | ICD-10-CM | POA: Diagnosis not present

## 2019-04-20 DIAGNOSIS — D631 Anemia in chronic kidney disease: Secondary | ICD-10-CM | POA: Diagnosis not present

## 2019-04-20 DIAGNOSIS — E1122 Type 2 diabetes mellitus with diabetic chronic kidney disease: Secondary | ICD-10-CM | POA: Diagnosis not present

## 2019-04-20 DIAGNOSIS — N2581 Secondary hyperparathyroidism of renal origin: Secondary | ICD-10-CM | POA: Diagnosis not present

## 2019-04-20 DIAGNOSIS — I480 Paroxysmal atrial fibrillation: Secondary | ICD-10-CM | POA: Diagnosis not present

## 2019-04-20 DIAGNOSIS — I129 Hypertensive chronic kidney disease with stage 1 through stage 4 chronic kidney disease, or unspecified chronic kidney disease: Secondary | ICD-10-CM | POA: Diagnosis not present

## 2019-04-20 DIAGNOSIS — N189 Chronic kidney disease, unspecified: Secondary | ICD-10-CM | POA: Diagnosis not present

## 2019-04-25 DIAGNOSIS — N184 Chronic kidney disease, stage 4 (severe): Secondary | ICD-10-CM | POA: Diagnosis not present

## 2019-05-02 DIAGNOSIS — Z7901 Long term (current) use of anticoagulants: Secondary | ICD-10-CM | POA: Diagnosis not present

## 2019-05-12 DIAGNOSIS — Z7901 Long term (current) use of anticoagulants: Secondary | ICD-10-CM | POA: Diagnosis not present

## 2019-05-15 DIAGNOSIS — H60501 Unspecified acute noninfective otitis externa, right ear: Secondary | ICD-10-CM | POA: Diagnosis not present

## 2019-05-15 DIAGNOSIS — Z7901 Long term (current) use of anticoagulants: Secondary | ICD-10-CM | POA: Diagnosis not present

## 2019-05-25 DIAGNOSIS — H66011 Acute suppurative otitis media with spontaneous rupture of ear drum, right ear: Secondary | ICD-10-CM | POA: Diagnosis not present

## 2019-05-25 DIAGNOSIS — H9011 Conductive hearing loss, unilateral, right ear, with unrestricted hearing on the contralateral side: Secondary | ICD-10-CM | POA: Diagnosis not present

## 2019-06-19 DIAGNOSIS — Z7901 Long term (current) use of anticoagulants: Secondary | ICD-10-CM | POA: Diagnosis not present

## 2019-06-20 DIAGNOSIS — E611 Iron deficiency: Secondary | ICD-10-CM | POA: Diagnosis not present

## 2019-06-20 DIAGNOSIS — R399 Unspecified symptoms and signs involving the genitourinary system: Secondary | ICD-10-CM | POA: Diagnosis not present

## 2019-06-20 DIAGNOSIS — E039 Hypothyroidism, unspecified: Secondary | ICD-10-CM | POA: Diagnosis not present

## 2019-06-20 DIAGNOSIS — E559 Vitamin D deficiency, unspecified: Secondary | ICD-10-CM | POA: Diagnosis not present

## 2019-06-20 DIAGNOSIS — E785 Hyperlipidemia, unspecified: Secondary | ICD-10-CM | POA: Diagnosis not present

## 2019-06-20 DIAGNOSIS — E1140 Type 2 diabetes mellitus with diabetic neuropathy, unspecified: Secondary | ICD-10-CM | POA: Diagnosis not present

## 2019-06-20 DIAGNOSIS — I10 Essential (primary) hypertension: Secondary | ICD-10-CM | POA: Diagnosis not present

## 2019-06-20 DIAGNOSIS — Z7901 Long term (current) use of anticoagulants: Secondary | ICD-10-CM | POA: Diagnosis not present

## 2019-06-20 DIAGNOSIS — E538 Deficiency of other specified B group vitamins: Secondary | ICD-10-CM | POA: Diagnosis not present

## 2019-06-22 DIAGNOSIS — F329 Major depressive disorder, single episode, unspecified: Secondary | ICD-10-CM | POA: Diagnosis not present

## 2019-06-22 DIAGNOSIS — I130 Hypertensive heart and chronic kidney disease with heart failure and stage 1 through stage 4 chronic kidney disease, or unspecified chronic kidney disease: Secondary | ICD-10-CM | POA: Diagnosis not present

## 2019-06-22 DIAGNOSIS — E785 Hyperlipidemia, unspecified: Secondary | ICD-10-CM | POA: Diagnosis not present

## 2019-06-22 DIAGNOSIS — K449 Diaphragmatic hernia without obstruction or gangrene: Secondary | ICD-10-CM | POA: Diagnosis not present

## 2019-06-22 DIAGNOSIS — M810 Age-related osteoporosis without current pathological fracture: Secondary | ICD-10-CM | POA: Diagnosis not present

## 2019-06-22 DIAGNOSIS — E11319 Type 2 diabetes mellitus with unspecified diabetic retinopathy without macular edema: Secondary | ICD-10-CM | POA: Diagnosis not present

## 2019-06-22 DIAGNOSIS — E559 Vitamin D deficiency, unspecified: Secondary | ICD-10-CM | POA: Diagnosis not present

## 2019-06-22 DIAGNOSIS — E538 Deficiency of other specified B group vitamins: Secondary | ICD-10-CM | POA: Diagnosis not present

## 2019-06-22 DIAGNOSIS — R531 Weakness: Secondary | ICD-10-CM | POA: Diagnosis not present

## 2019-06-22 DIAGNOSIS — L89151 Pressure ulcer of sacral region, stage 1: Secondary | ICD-10-CM | POA: Diagnosis not present

## 2019-06-22 DIAGNOSIS — I5030 Unspecified diastolic (congestive) heart failure: Secondary | ICD-10-CM | POA: Diagnosis not present

## 2019-06-22 DIAGNOSIS — E890 Postprocedural hypothyroidism: Secondary | ICD-10-CM | POA: Diagnosis not present

## 2019-06-22 DIAGNOSIS — R54 Age-related physical debility: Secondary | ICD-10-CM | POA: Diagnosis not present

## 2019-06-22 DIAGNOSIS — Z5181 Encounter for therapeutic drug level monitoring: Secondary | ICD-10-CM | POA: Diagnosis not present

## 2019-06-22 DIAGNOSIS — Z7901 Long term (current) use of anticoagulants: Secondary | ICD-10-CM | POA: Diagnosis not present

## 2019-06-22 DIAGNOSIS — E1140 Type 2 diabetes mellitus with diabetic neuropathy, unspecified: Secondary | ICD-10-CM | POA: Diagnosis not present

## 2019-06-22 DIAGNOSIS — Z87891 Personal history of nicotine dependence: Secondary | ICD-10-CM | POA: Diagnosis not present

## 2019-06-22 DIAGNOSIS — D692 Other nonthrombocytopenic purpura: Secondary | ICD-10-CM | POA: Diagnosis not present

## 2019-06-22 DIAGNOSIS — J439 Emphysema, unspecified: Secondary | ICD-10-CM | POA: Diagnosis not present

## 2019-06-22 DIAGNOSIS — E1122 Type 2 diabetes mellitus with diabetic chronic kidney disease: Secondary | ICD-10-CM | POA: Diagnosis not present

## 2019-06-22 DIAGNOSIS — Z7984 Long term (current) use of oral hypoglycemic drugs: Secondary | ICD-10-CM | POA: Diagnosis not present

## 2019-06-22 DIAGNOSIS — N183 Chronic kidney disease, stage 3 unspecified: Secondary | ICD-10-CM | POA: Diagnosis not present

## 2019-06-22 DIAGNOSIS — E1151 Type 2 diabetes mellitus with diabetic peripheral angiopathy without gangrene: Secondary | ICD-10-CM | POA: Diagnosis not present

## 2019-06-22 DIAGNOSIS — I4891 Unspecified atrial fibrillation: Secondary | ICD-10-CM | POA: Diagnosis not present

## 2019-06-22 DIAGNOSIS — K219 Gastro-esophageal reflux disease without esophagitis: Secondary | ICD-10-CM | POA: Diagnosis not present

## 2019-06-22 DIAGNOSIS — F419 Anxiety disorder, unspecified: Secondary | ICD-10-CM | POA: Diagnosis not present

## 2019-06-22 DIAGNOSIS — Z853 Personal history of malignant neoplasm of breast: Secondary | ICD-10-CM | POA: Diagnosis not present

## 2019-06-22 DIAGNOSIS — E441 Mild protein-calorie malnutrition: Secondary | ICD-10-CM | POA: Diagnosis not present

## 2019-06-25 DIAGNOSIS — N183 Chronic kidney disease, stage 3 unspecified: Secondary | ICD-10-CM | POA: Diagnosis not present

## 2019-06-25 DIAGNOSIS — I130 Hypertensive heart and chronic kidney disease with heart failure and stage 1 through stage 4 chronic kidney disease, or unspecified chronic kidney disease: Secondary | ICD-10-CM | POA: Diagnosis not present

## 2019-06-25 DIAGNOSIS — L89151 Pressure ulcer of sacral region, stage 1: Secondary | ICD-10-CM | POA: Diagnosis not present

## 2019-06-25 DIAGNOSIS — E1122 Type 2 diabetes mellitus with diabetic chronic kidney disease: Secondary | ICD-10-CM | POA: Diagnosis not present

## 2019-06-25 DIAGNOSIS — E441 Mild protein-calorie malnutrition: Secondary | ICD-10-CM | POA: Diagnosis not present

## 2019-06-25 DIAGNOSIS — I5030 Unspecified diastolic (congestive) heart failure: Secondary | ICD-10-CM | POA: Diagnosis not present

## 2019-06-27 DIAGNOSIS — I130 Hypertensive heart and chronic kidney disease with heart failure and stage 1 through stage 4 chronic kidney disease, or unspecified chronic kidney disease: Secondary | ICD-10-CM | POA: Diagnosis not present

## 2019-06-27 DIAGNOSIS — L89151 Pressure ulcer of sacral region, stage 1: Secondary | ICD-10-CM | POA: Diagnosis not present

## 2019-06-27 DIAGNOSIS — E1122 Type 2 diabetes mellitus with diabetic chronic kidney disease: Secondary | ICD-10-CM | POA: Diagnosis not present

## 2019-06-27 DIAGNOSIS — E441 Mild protein-calorie malnutrition: Secondary | ICD-10-CM | POA: Diagnosis not present

## 2019-06-27 DIAGNOSIS — N183 Chronic kidney disease, stage 3 unspecified: Secondary | ICD-10-CM | POA: Diagnosis not present

## 2019-06-27 DIAGNOSIS — I5030 Unspecified diastolic (congestive) heart failure: Secondary | ICD-10-CM | POA: Diagnosis not present

## 2019-06-28 DIAGNOSIS — E441 Mild protein-calorie malnutrition: Secondary | ICD-10-CM | POA: Diagnosis not present

## 2019-06-28 DIAGNOSIS — I5030 Unspecified diastolic (congestive) heart failure: Secondary | ICD-10-CM | POA: Diagnosis not present

## 2019-06-28 DIAGNOSIS — E1122 Type 2 diabetes mellitus with diabetic chronic kidney disease: Secondary | ICD-10-CM | POA: Diagnosis not present

## 2019-06-28 DIAGNOSIS — L89151 Pressure ulcer of sacral region, stage 1: Secondary | ICD-10-CM | POA: Diagnosis not present

## 2019-06-28 DIAGNOSIS — N183 Chronic kidney disease, stage 3 unspecified: Secondary | ICD-10-CM | POA: Diagnosis not present

## 2019-06-28 DIAGNOSIS — I130 Hypertensive heart and chronic kidney disease with heart failure and stage 1 through stage 4 chronic kidney disease, or unspecified chronic kidney disease: Secondary | ICD-10-CM | POA: Diagnosis not present

## 2019-06-30 DIAGNOSIS — N183 Chronic kidney disease, stage 3 unspecified: Secondary | ICD-10-CM | POA: Diagnosis not present

## 2019-06-30 DIAGNOSIS — L89151 Pressure ulcer of sacral region, stage 1: Secondary | ICD-10-CM | POA: Diagnosis not present

## 2019-06-30 DIAGNOSIS — I5030 Unspecified diastolic (congestive) heart failure: Secondary | ICD-10-CM | POA: Diagnosis not present

## 2019-06-30 DIAGNOSIS — E441 Mild protein-calorie malnutrition: Secondary | ICD-10-CM | POA: Diagnosis not present

## 2019-06-30 DIAGNOSIS — E1122 Type 2 diabetes mellitus with diabetic chronic kidney disease: Secondary | ICD-10-CM | POA: Diagnosis not present

## 2019-06-30 DIAGNOSIS — I130 Hypertensive heart and chronic kidney disease with heart failure and stage 1 through stage 4 chronic kidney disease, or unspecified chronic kidney disease: Secondary | ICD-10-CM | POA: Diagnosis not present

## 2019-07-03 DIAGNOSIS — L89151 Pressure ulcer of sacral region, stage 1: Secondary | ICD-10-CM | POA: Diagnosis not present

## 2019-07-03 DIAGNOSIS — I5030 Unspecified diastolic (congestive) heart failure: Secondary | ICD-10-CM | POA: Diagnosis not present

## 2019-07-03 DIAGNOSIS — I130 Hypertensive heart and chronic kidney disease with heart failure and stage 1 through stage 4 chronic kidney disease, or unspecified chronic kidney disease: Secondary | ICD-10-CM | POA: Diagnosis not present

## 2019-07-03 DIAGNOSIS — N183 Chronic kidney disease, stage 3 unspecified: Secondary | ICD-10-CM | POA: Diagnosis not present

## 2019-07-03 DIAGNOSIS — E1122 Type 2 diabetes mellitus with diabetic chronic kidney disease: Secondary | ICD-10-CM | POA: Diagnosis not present

## 2019-07-03 DIAGNOSIS — E441 Mild protein-calorie malnutrition: Secondary | ICD-10-CM | POA: Diagnosis not present

## 2019-07-06 DIAGNOSIS — I130 Hypertensive heart and chronic kidney disease with heart failure and stage 1 through stage 4 chronic kidney disease, or unspecified chronic kidney disease: Secondary | ICD-10-CM | POA: Diagnosis not present

## 2019-07-06 DIAGNOSIS — E1122 Type 2 diabetes mellitus with diabetic chronic kidney disease: Secondary | ICD-10-CM | POA: Diagnosis not present

## 2019-07-06 DIAGNOSIS — N183 Chronic kidney disease, stage 3 unspecified: Secondary | ICD-10-CM | POA: Diagnosis not present

## 2019-07-06 DIAGNOSIS — L89151 Pressure ulcer of sacral region, stage 1: Secondary | ICD-10-CM | POA: Diagnosis not present

## 2019-07-06 DIAGNOSIS — E441 Mild protein-calorie malnutrition: Secondary | ICD-10-CM | POA: Diagnosis not present

## 2019-07-06 DIAGNOSIS — I5030 Unspecified diastolic (congestive) heart failure: Secondary | ICD-10-CM | POA: Diagnosis not present

## 2019-07-07 DIAGNOSIS — I5030 Unspecified diastolic (congestive) heart failure: Secondary | ICD-10-CM | POA: Diagnosis not present

## 2019-07-07 DIAGNOSIS — E1122 Type 2 diabetes mellitus with diabetic chronic kidney disease: Secondary | ICD-10-CM | POA: Diagnosis not present

## 2019-07-07 DIAGNOSIS — E441 Mild protein-calorie malnutrition: Secondary | ICD-10-CM | POA: Diagnosis not present

## 2019-07-07 DIAGNOSIS — I130 Hypertensive heart and chronic kidney disease with heart failure and stage 1 through stage 4 chronic kidney disease, or unspecified chronic kidney disease: Secondary | ICD-10-CM | POA: Diagnosis not present

## 2019-07-07 DIAGNOSIS — N183 Chronic kidney disease, stage 3 unspecified: Secondary | ICD-10-CM | POA: Diagnosis not present

## 2019-07-07 DIAGNOSIS — L89151 Pressure ulcer of sacral region, stage 1: Secondary | ICD-10-CM | POA: Diagnosis not present

## 2019-07-10 DIAGNOSIS — I5030 Unspecified diastolic (congestive) heart failure: Secondary | ICD-10-CM | POA: Diagnosis not present

## 2019-07-10 DIAGNOSIS — E441 Mild protein-calorie malnutrition: Secondary | ICD-10-CM | POA: Diagnosis not present

## 2019-07-10 DIAGNOSIS — I130 Hypertensive heart and chronic kidney disease with heart failure and stage 1 through stage 4 chronic kidney disease, or unspecified chronic kidney disease: Secondary | ICD-10-CM | POA: Diagnosis not present

## 2019-07-10 DIAGNOSIS — E1122 Type 2 diabetes mellitus with diabetic chronic kidney disease: Secondary | ICD-10-CM | POA: Diagnosis not present

## 2019-07-10 DIAGNOSIS — L89151 Pressure ulcer of sacral region, stage 1: Secondary | ICD-10-CM | POA: Diagnosis not present

## 2019-07-10 DIAGNOSIS — N183 Chronic kidney disease, stage 3 unspecified: Secondary | ICD-10-CM | POA: Diagnosis not present

## 2019-07-12 DIAGNOSIS — I130 Hypertensive heart and chronic kidney disease with heart failure and stage 1 through stage 4 chronic kidney disease, or unspecified chronic kidney disease: Secondary | ICD-10-CM | POA: Diagnosis not present

## 2019-07-12 DIAGNOSIS — E1122 Type 2 diabetes mellitus with diabetic chronic kidney disease: Secondary | ICD-10-CM | POA: Diagnosis not present

## 2019-07-12 DIAGNOSIS — E441 Mild protein-calorie malnutrition: Secondary | ICD-10-CM | POA: Diagnosis not present

## 2019-07-12 DIAGNOSIS — I5030 Unspecified diastolic (congestive) heart failure: Secondary | ICD-10-CM | POA: Diagnosis not present

## 2019-07-12 DIAGNOSIS — N183 Chronic kidney disease, stage 3 unspecified: Secondary | ICD-10-CM | POA: Diagnosis not present

## 2019-07-12 DIAGNOSIS — L89151 Pressure ulcer of sacral region, stage 1: Secondary | ICD-10-CM | POA: Diagnosis not present

## 2019-07-13 DIAGNOSIS — I5030 Unspecified diastolic (congestive) heart failure: Secondary | ICD-10-CM | POA: Diagnosis not present

## 2019-07-13 DIAGNOSIS — N183 Chronic kidney disease, stage 3 unspecified: Secondary | ICD-10-CM | POA: Diagnosis not present

## 2019-07-13 DIAGNOSIS — L89151 Pressure ulcer of sacral region, stage 1: Secondary | ICD-10-CM | POA: Diagnosis not present

## 2019-07-13 DIAGNOSIS — E1122 Type 2 diabetes mellitus with diabetic chronic kidney disease: Secondary | ICD-10-CM | POA: Diagnosis not present

## 2019-07-13 DIAGNOSIS — I130 Hypertensive heart and chronic kidney disease with heart failure and stage 1 through stage 4 chronic kidney disease, or unspecified chronic kidney disease: Secondary | ICD-10-CM | POA: Diagnosis not present

## 2019-07-13 DIAGNOSIS — E441 Mild protein-calorie malnutrition: Secondary | ICD-10-CM | POA: Diagnosis not present

## 2019-07-17 DIAGNOSIS — Z7901 Long term (current) use of anticoagulants: Secondary | ICD-10-CM | POA: Diagnosis not present

## 2019-07-17 DIAGNOSIS — D692 Other nonthrombocytopenic purpura: Secondary | ICD-10-CM | POA: Diagnosis not present

## 2019-07-17 DIAGNOSIS — Z5181 Encounter for therapeutic drug level monitoring: Secondary | ICD-10-CM | POA: Diagnosis not present

## 2019-07-17 DIAGNOSIS — E1140 Type 2 diabetes mellitus with diabetic neuropathy, unspecified: Secondary | ICD-10-CM | POA: Diagnosis not present

## 2019-07-19 DIAGNOSIS — E1122 Type 2 diabetes mellitus with diabetic chronic kidney disease: Secondary | ICD-10-CM | POA: Diagnosis not present

## 2019-07-19 DIAGNOSIS — L89151 Pressure ulcer of sacral region, stage 1: Secondary | ICD-10-CM | POA: Diagnosis not present

## 2019-07-19 DIAGNOSIS — I130 Hypertensive heart and chronic kidney disease with heart failure and stage 1 through stage 4 chronic kidney disease, or unspecified chronic kidney disease: Secondary | ICD-10-CM | POA: Diagnosis not present

## 2019-07-19 DIAGNOSIS — I5030 Unspecified diastolic (congestive) heart failure: Secondary | ICD-10-CM | POA: Diagnosis not present

## 2019-07-19 DIAGNOSIS — E441 Mild protein-calorie malnutrition: Secondary | ICD-10-CM | POA: Diagnosis not present

## 2019-07-19 DIAGNOSIS — N183 Chronic kidney disease, stage 3 unspecified: Secondary | ICD-10-CM | POA: Diagnosis not present

## 2019-07-20 DIAGNOSIS — E441 Mild protein-calorie malnutrition: Secondary | ICD-10-CM | POA: Diagnosis not present

## 2019-07-20 DIAGNOSIS — N183 Chronic kidney disease, stage 3 unspecified: Secondary | ICD-10-CM | POA: Diagnosis not present

## 2019-07-20 DIAGNOSIS — L89151 Pressure ulcer of sacral region, stage 1: Secondary | ICD-10-CM | POA: Diagnosis not present

## 2019-07-20 DIAGNOSIS — E1122 Type 2 diabetes mellitus with diabetic chronic kidney disease: Secondary | ICD-10-CM | POA: Diagnosis not present

## 2019-07-20 DIAGNOSIS — I130 Hypertensive heart and chronic kidney disease with heart failure and stage 1 through stage 4 chronic kidney disease, or unspecified chronic kidney disease: Secondary | ICD-10-CM | POA: Diagnosis not present

## 2019-07-20 DIAGNOSIS — I5030 Unspecified diastolic (congestive) heart failure: Secondary | ICD-10-CM | POA: Diagnosis not present

## 2019-07-21 DIAGNOSIS — L89151 Pressure ulcer of sacral region, stage 1: Secondary | ICD-10-CM | POA: Diagnosis not present

## 2019-07-21 DIAGNOSIS — N183 Chronic kidney disease, stage 3 unspecified: Secondary | ICD-10-CM | POA: Diagnosis not present

## 2019-07-21 DIAGNOSIS — I130 Hypertensive heart and chronic kidney disease with heart failure and stage 1 through stage 4 chronic kidney disease, or unspecified chronic kidney disease: Secondary | ICD-10-CM | POA: Diagnosis not present

## 2019-07-21 DIAGNOSIS — I5030 Unspecified diastolic (congestive) heart failure: Secondary | ICD-10-CM | POA: Diagnosis not present

## 2019-07-21 DIAGNOSIS — E1122 Type 2 diabetes mellitus with diabetic chronic kidney disease: Secondary | ICD-10-CM | POA: Diagnosis not present

## 2019-07-21 DIAGNOSIS — E441 Mild protein-calorie malnutrition: Secondary | ICD-10-CM | POA: Diagnosis not present

## 2019-07-22 DIAGNOSIS — K449 Diaphragmatic hernia without obstruction or gangrene: Secondary | ICD-10-CM | POA: Diagnosis not present

## 2019-07-22 DIAGNOSIS — L89151 Pressure ulcer of sacral region, stage 1: Secondary | ICD-10-CM | POA: Diagnosis not present

## 2019-07-22 DIAGNOSIS — E1122 Type 2 diabetes mellitus with diabetic chronic kidney disease: Secondary | ICD-10-CM | POA: Diagnosis not present

## 2019-07-22 DIAGNOSIS — F329 Major depressive disorder, single episode, unspecified: Secondary | ICD-10-CM | POA: Diagnosis not present

## 2019-07-22 DIAGNOSIS — I4891 Unspecified atrial fibrillation: Secondary | ICD-10-CM | POA: Diagnosis not present

## 2019-07-22 DIAGNOSIS — E1140 Type 2 diabetes mellitus with diabetic neuropathy, unspecified: Secondary | ICD-10-CM | POA: Diagnosis not present

## 2019-07-22 DIAGNOSIS — N183 Chronic kidney disease, stage 3 unspecified: Secondary | ICD-10-CM | POA: Diagnosis not present

## 2019-07-22 DIAGNOSIS — E441 Mild protein-calorie malnutrition: Secondary | ICD-10-CM | POA: Diagnosis not present

## 2019-07-22 DIAGNOSIS — M810 Age-related osteoporosis without current pathological fracture: Secondary | ICD-10-CM | POA: Diagnosis not present

## 2019-07-22 DIAGNOSIS — K219 Gastro-esophageal reflux disease without esophagitis: Secondary | ICD-10-CM | POA: Diagnosis not present

## 2019-07-22 DIAGNOSIS — E538 Deficiency of other specified B group vitamins: Secondary | ICD-10-CM | POA: Diagnosis not present

## 2019-07-22 DIAGNOSIS — F419 Anxiety disorder, unspecified: Secondary | ICD-10-CM | POA: Diagnosis not present

## 2019-07-22 DIAGNOSIS — Z853 Personal history of malignant neoplasm of breast: Secondary | ICD-10-CM | POA: Diagnosis not present

## 2019-07-22 DIAGNOSIS — Z87891 Personal history of nicotine dependence: Secondary | ICD-10-CM | POA: Diagnosis not present

## 2019-07-22 DIAGNOSIS — Z5181 Encounter for therapeutic drug level monitoring: Secondary | ICD-10-CM | POA: Diagnosis not present

## 2019-07-22 DIAGNOSIS — E559 Vitamin D deficiency, unspecified: Secondary | ICD-10-CM | POA: Diagnosis not present

## 2019-07-22 DIAGNOSIS — E1151 Type 2 diabetes mellitus with diabetic peripheral angiopathy without gangrene: Secondary | ICD-10-CM | POA: Diagnosis not present

## 2019-07-22 DIAGNOSIS — E11319 Type 2 diabetes mellitus with unspecified diabetic retinopathy without macular edema: Secondary | ICD-10-CM | POA: Diagnosis not present

## 2019-07-22 DIAGNOSIS — E785 Hyperlipidemia, unspecified: Secondary | ICD-10-CM | POA: Diagnosis not present

## 2019-07-22 DIAGNOSIS — Z7901 Long term (current) use of anticoagulants: Secondary | ICD-10-CM | POA: Diagnosis not present

## 2019-07-22 DIAGNOSIS — E890 Postprocedural hypothyroidism: Secondary | ICD-10-CM | POA: Diagnosis not present

## 2019-07-22 DIAGNOSIS — I130 Hypertensive heart and chronic kidney disease with heart failure and stage 1 through stage 4 chronic kidney disease, or unspecified chronic kidney disease: Secondary | ICD-10-CM | POA: Diagnosis not present

## 2019-07-22 DIAGNOSIS — J439 Emphysema, unspecified: Secondary | ICD-10-CM | POA: Diagnosis not present

## 2019-07-22 DIAGNOSIS — Z7984 Long term (current) use of oral hypoglycemic drugs: Secondary | ICD-10-CM | POA: Diagnosis not present

## 2019-07-22 DIAGNOSIS — I5030 Unspecified diastolic (congestive) heart failure: Secondary | ICD-10-CM | POA: Diagnosis not present

## 2019-07-24 DIAGNOSIS — L89151 Pressure ulcer of sacral region, stage 1: Secondary | ICD-10-CM | POA: Diagnosis not present

## 2019-07-24 DIAGNOSIS — E1122 Type 2 diabetes mellitus with diabetic chronic kidney disease: Secondary | ICD-10-CM | POA: Diagnosis not present

## 2019-07-24 DIAGNOSIS — E441 Mild protein-calorie malnutrition: Secondary | ICD-10-CM | POA: Diagnosis not present

## 2019-07-24 DIAGNOSIS — N183 Chronic kidney disease, stage 3 unspecified: Secondary | ICD-10-CM | POA: Diagnosis not present

## 2019-07-24 DIAGNOSIS — I5030 Unspecified diastolic (congestive) heart failure: Secondary | ICD-10-CM | POA: Diagnosis not present

## 2019-07-24 DIAGNOSIS — I130 Hypertensive heart and chronic kidney disease with heart failure and stage 1 through stage 4 chronic kidney disease, or unspecified chronic kidney disease: Secondary | ICD-10-CM | POA: Diagnosis not present

## 2019-07-26 DIAGNOSIS — N2581 Secondary hyperparathyroidism of renal origin: Secondary | ICD-10-CM | POA: Diagnosis not present

## 2019-07-26 DIAGNOSIS — D631 Anemia in chronic kidney disease: Secondary | ICD-10-CM | POA: Diagnosis not present

## 2019-07-26 DIAGNOSIS — E1122 Type 2 diabetes mellitus with diabetic chronic kidney disease: Secondary | ICD-10-CM | POA: Diagnosis not present

## 2019-07-26 DIAGNOSIS — N184 Chronic kidney disease, stage 4 (severe): Secondary | ICD-10-CM | POA: Diagnosis not present

## 2019-07-26 DIAGNOSIS — I129 Hypertensive chronic kidney disease with stage 1 through stage 4 chronic kidney disease, or unspecified chronic kidney disease: Secondary | ICD-10-CM | POA: Diagnosis not present

## 2019-07-27 DIAGNOSIS — N183 Chronic kidney disease, stage 3 unspecified: Secondary | ICD-10-CM | POA: Diagnosis not present

## 2019-07-27 DIAGNOSIS — E1122 Type 2 diabetes mellitus with diabetic chronic kidney disease: Secondary | ICD-10-CM | POA: Diagnosis not present

## 2019-07-27 DIAGNOSIS — I130 Hypertensive heart and chronic kidney disease with heart failure and stage 1 through stage 4 chronic kidney disease, or unspecified chronic kidney disease: Secondary | ICD-10-CM | POA: Diagnosis not present

## 2019-07-27 DIAGNOSIS — L89151 Pressure ulcer of sacral region, stage 1: Secondary | ICD-10-CM | POA: Diagnosis not present

## 2019-07-27 DIAGNOSIS — I5030 Unspecified diastolic (congestive) heart failure: Secondary | ICD-10-CM | POA: Diagnosis not present

## 2019-07-27 DIAGNOSIS — E441 Mild protein-calorie malnutrition: Secondary | ICD-10-CM | POA: Diagnosis not present

## 2019-07-31 DIAGNOSIS — E441 Mild protein-calorie malnutrition: Secondary | ICD-10-CM | POA: Diagnosis not present

## 2019-07-31 DIAGNOSIS — L89151 Pressure ulcer of sacral region, stage 1: Secondary | ICD-10-CM | POA: Diagnosis not present

## 2019-07-31 DIAGNOSIS — I130 Hypertensive heart and chronic kidney disease with heart failure and stage 1 through stage 4 chronic kidney disease, or unspecified chronic kidney disease: Secondary | ICD-10-CM | POA: Diagnosis not present

## 2019-07-31 DIAGNOSIS — N183 Chronic kidney disease, stage 3 unspecified: Secondary | ICD-10-CM | POA: Diagnosis not present

## 2019-07-31 DIAGNOSIS — E1122 Type 2 diabetes mellitus with diabetic chronic kidney disease: Secondary | ICD-10-CM | POA: Diagnosis not present

## 2019-07-31 DIAGNOSIS — I5030 Unspecified diastolic (congestive) heart failure: Secondary | ICD-10-CM | POA: Diagnosis not present

## 2019-08-02 DIAGNOSIS — L89151 Pressure ulcer of sacral region, stage 1: Secondary | ICD-10-CM | POA: Diagnosis not present

## 2019-08-02 DIAGNOSIS — E1122 Type 2 diabetes mellitus with diabetic chronic kidney disease: Secondary | ICD-10-CM | POA: Diagnosis not present

## 2019-08-02 DIAGNOSIS — I5030 Unspecified diastolic (congestive) heart failure: Secondary | ICD-10-CM | POA: Diagnosis not present

## 2019-08-02 DIAGNOSIS — N183 Chronic kidney disease, stage 3 unspecified: Secondary | ICD-10-CM | POA: Diagnosis not present

## 2019-08-02 DIAGNOSIS — I130 Hypertensive heart and chronic kidney disease with heart failure and stage 1 through stage 4 chronic kidney disease, or unspecified chronic kidney disease: Secondary | ICD-10-CM | POA: Diagnosis not present

## 2019-08-02 DIAGNOSIS — E441 Mild protein-calorie malnutrition: Secondary | ICD-10-CM | POA: Diagnosis not present

## 2019-08-04 DIAGNOSIS — E441 Mild protein-calorie malnutrition: Secondary | ICD-10-CM | POA: Diagnosis not present

## 2019-08-04 DIAGNOSIS — N183 Chronic kidney disease, stage 3 unspecified: Secondary | ICD-10-CM | POA: Diagnosis not present

## 2019-08-04 DIAGNOSIS — I5030 Unspecified diastolic (congestive) heart failure: Secondary | ICD-10-CM | POA: Diagnosis not present

## 2019-08-04 DIAGNOSIS — I130 Hypertensive heart and chronic kidney disease with heart failure and stage 1 through stage 4 chronic kidney disease, or unspecified chronic kidney disease: Secondary | ICD-10-CM | POA: Diagnosis not present

## 2019-08-04 DIAGNOSIS — E1122 Type 2 diabetes mellitus with diabetic chronic kidney disease: Secondary | ICD-10-CM | POA: Diagnosis not present

## 2019-08-04 DIAGNOSIS — L89151 Pressure ulcer of sacral region, stage 1: Secondary | ICD-10-CM | POA: Diagnosis not present

## 2019-08-09 DIAGNOSIS — N183 Chronic kidney disease, stage 3 unspecified: Secondary | ICD-10-CM | POA: Diagnosis not present

## 2019-08-09 DIAGNOSIS — E441 Mild protein-calorie malnutrition: Secondary | ICD-10-CM | POA: Diagnosis not present

## 2019-08-09 DIAGNOSIS — E1122 Type 2 diabetes mellitus with diabetic chronic kidney disease: Secondary | ICD-10-CM | POA: Diagnosis not present

## 2019-08-09 DIAGNOSIS — I130 Hypertensive heart and chronic kidney disease with heart failure and stage 1 through stage 4 chronic kidney disease, or unspecified chronic kidney disease: Secondary | ICD-10-CM | POA: Diagnosis not present

## 2019-08-09 DIAGNOSIS — I5030 Unspecified diastolic (congestive) heart failure: Secondary | ICD-10-CM | POA: Diagnosis not present

## 2019-08-09 DIAGNOSIS — L89151 Pressure ulcer of sacral region, stage 1: Secondary | ICD-10-CM | POA: Diagnosis not present

## 2019-08-11 DIAGNOSIS — L89151 Pressure ulcer of sacral region, stage 1: Secondary | ICD-10-CM | POA: Diagnosis not present

## 2019-08-11 DIAGNOSIS — E1122 Type 2 diabetes mellitus with diabetic chronic kidney disease: Secondary | ICD-10-CM | POA: Diagnosis not present

## 2019-08-11 DIAGNOSIS — I5030 Unspecified diastolic (congestive) heart failure: Secondary | ICD-10-CM | POA: Diagnosis not present

## 2019-08-11 DIAGNOSIS — E441 Mild protein-calorie malnutrition: Secondary | ICD-10-CM | POA: Diagnosis not present

## 2019-08-11 DIAGNOSIS — N183 Chronic kidney disease, stage 3 unspecified: Secondary | ICD-10-CM | POA: Diagnosis not present

## 2019-08-11 DIAGNOSIS — I130 Hypertensive heart and chronic kidney disease with heart failure and stage 1 through stage 4 chronic kidney disease, or unspecified chronic kidney disease: Secondary | ICD-10-CM | POA: Diagnosis not present

## 2019-08-14 DIAGNOSIS — I5030 Unspecified diastolic (congestive) heart failure: Secondary | ICD-10-CM | POA: Diagnosis not present

## 2019-08-14 DIAGNOSIS — L89151 Pressure ulcer of sacral region, stage 1: Secondary | ICD-10-CM | POA: Diagnosis not present

## 2019-08-14 DIAGNOSIS — I130 Hypertensive heart and chronic kidney disease with heart failure and stage 1 through stage 4 chronic kidney disease, or unspecified chronic kidney disease: Secondary | ICD-10-CM | POA: Diagnosis not present

## 2019-08-14 DIAGNOSIS — E1122 Type 2 diabetes mellitus with diabetic chronic kidney disease: Secondary | ICD-10-CM | POA: Diagnosis not present

## 2019-08-14 DIAGNOSIS — E441 Mild protein-calorie malnutrition: Secondary | ICD-10-CM | POA: Diagnosis not present

## 2019-08-14 DIAGNOSIS — N183 Chronic kidney disease, stage 3 unspecified: Secondary | ICD-10-CM | POA: Diagnosis not present

## 2019-08-15 DIAGNOSIS — L89152 Pressure ulcer of sacral region, stage 2: Secondary | ICD-10-CM | POA: Diagnosis not present

## 2019-08-15 DIAGNOSIS — E441 Mild protein-calorie malnutrition: Secondary | ICD-10-CM | POA: Diagnosis not present

## 2019-08-15 DIAGNOSIS — Z7901 Long term (current) use of anticoagulants: Secondary | ICD-10-CM | POA: Diagnosis not present

## 2019-08-17 DIAGNOSIS — E441 Mild protein-calorie malnutrition: Secondary | ICD-10-CM | POA: Diagnosis not present

## 2019-08-17 DIAGNOSIS — L89151 Pressure ulcer of sacral region, stage 1: Secondary | ICD-10-CM | POA: Diagnosis not present

## 2019-08-17 DIAGNOSIS — N183 Chronic kidney disease, stage 3 unspecified: Secondary | ICD-10-CM | POA: Diagnosis not present

## 2019-08-17 DIAGNOSIS — E1122 Type 2 diabetes mellitus with diabetic chronic kidney disease: Secondary | ICD-10-CM | POA: Diagnosis not present

## 2019-08-17 DIAGNOSIS — I130 Hypertensive heart and chronic kidney disease with heart failure and stage 1 through stage 4 chronic kidney disease, or unspecified chronic kidney disease: Secondary | ICD-10-CM | POA: Diagnosis not present

## 2019-08-17 DIAGNOSIS — I5030 Unspecified diastolic (congestive) heart failure: Secondary | ICD-10-CM | POA: Diagnosis not present

## 2019-08-18 DIAGNOSIS — E1122 Type 2 diabetes mellitus with diabetic chronic kidney disease: Secondary | ICD-10-CM | POA: Diagnosis not present

## 2019-08-18 DIAGNOSIS — E441 Mild protein-calorie malnutrition: Secondary | ICD-10-CM | POA: Diagnosis not present

## 2019-08-18 DIAGNOSIS — I130 Hypertensive heart and chronic kidney disease with heart failure and stage 1 through stage 4 chronic kidney disease, or unspecified chronic kidney disease: Secondary | ICD-10-CM | POA: Diagnosis not present

## 2019-08-18 DIAGNOSIS — N183 Chronic kidney disease, stage 3 unspecified: Secondary | ICD-10-CM | POA: Diagnosis not present

## 2019-08-18 DIAGNOSIS — I5030 Unspecified diastolic (congestive) heart failure: Secondary | ICD-10-CM | POA: Diagnosis not present

## 2019-08-18 DIAGNOSIS — L89151 Pressure ulcer of sacral region, stage 1: Secondary | ICD-10-CM | POA: Diagnosis not present

## 2019-08-21 DIAGNOSIS — M810 Age-related osteoporosis without current pathological fracture: Secondary | ICD-10-CM | POA: Diagnosis not present

## 2019-08-21 DIAGNOSIS — E441 Mild protein-calorie malnutrition: Secondary | ICD-10-CM | POA: Diagnosis not present

## 2019-08-21 DIAGNOSIS — Z87891 Personal history of nicotine dependence: Secondary | ICD-10-CM | POA: Diagnosis not present

## 2019-08-21 DIAGNOSIS — F419 Anxiety disorder, unspecified: Secondary | ICD-10-CM | POA: Diagnosis not present

## 2019-08-21 DIAGNOSIS — E11319 Type 2 diabetes mellitus with unspecified diabetic retinopathy without macular edema: Secondary | ICD-10-CM | POA: Diagnosis not present

## 2019-08-21 DIAGNOSIS — E1140 Type 2 diabetes mellitus with diabetic neuropathy, unspecified: Secondary | ICD-10-CM | POA: Diagnosis not present

## 2019-08-21 DIAGNOSIS — E1122 Type 2 diabetes mellitus with diabetic chronic kidney disease: Secondary | ICD-10-CM | POA: Diagnosis not present

## 2019-08-21 DIAGNOSIS — J439 Emphysema, unspecified: Secondary | ICD-10-CM | POA: Diagnosis not present

## 2019-08-21 DIAGNOSIS — I5030 Unspecified diastolic (congestive) heart failure: Secondary | ICD-10-CM | POA: Diagnosis not present

## 2019-08-21 DIAGNOSIS — Z853 Personal history of malignant neoplasm of breast: Secondary | ICD-10-CM | POA: Diagnosis not present

## 2019-08-21 DIAGNOSIS — E538 Deficiency of other specified B group vitamins: Secondary | ICD-10-CM | POA: Diagnosis not present

## 2019-08-21 DIAGNOSIS — F329 Major depressive disorder, single episode, unspecified: Secondary | ICD-10-CM | POA: Diagnosis not present

## 2019-08-21 DIAGNOSIS — Z7984 Long term (current) use of oral hypoglycemic drugs: Secondary | ICD-10-CM | POA: Diagnosis not present

## 2019-08-21 DIAGNOSIS — E559 Vitamin D deficiency, unspecified: Secondary | ICD-10-CM | POA: Diagnosis not present

## 2019-08-21 DIAGNOSIS — E785 Hyperlipidemia, unspecified: Secondary | ICD-10-CM | POA: Diagnosis not present

## 2019-08-21 DIAGNOSIS — E890 Postprocedural hypothyroidism: Secondary | ICD-10-CM | POA: Diagnosis not present

## 2019-08-21 DIAGNOSIS — K219 Gastro-esophageal reflux disease without esophagitis: Secondary | ICD-10-CM | POA: Diagnosis not present

## 2019-08-21 DIAGNOSIS — I130 Hypertensive heart and chronic kidney disease with heart failure and stage 1 through stage 4 chronic kidney disease, or unspecified chronic kidney disease: Secondary | ICD-10-CM | POA: Diagnosis not present

## 2019-08-21 DIAGNOSIS — Z9181 History of falling: Secondary | ICD-10-CM | POA: Diagnosis not present

## 2019-08-21 DIAGNOSIS — E1151 Type 2 diabetes mellitus with diabetic peripheral angiopathy without gangrene: Secondary | ICD-10-CM | POA: Diagnosis not present

## 2019-08-21 DIAGNOSIS — N183 Chronic kidney disease, stage 3 unspecified: Secondary | ICD-10-CM | POA: Diagnosis not present

## 2019-08-21 DIAGNOSIS — Z7901 Long term (current) use of anticoagulants: Secondary | ICD-10-CM | POA: Diagnosis not present

## 2019-08-21 DIAGNOSIS — L89152 Pressure ulcer of sacral region, stage 2: Secondary | ICD-10-CM | POA: Diagnosis not present

## 2019-08-21 DIAGNOSIS — I4891 Unspecified atrial fibrillation: Secondary | ICD-10-CM | POA: Diagnosis not present

## 2019-08-21 DIAGNOSIS — K449 Diaphragmatic hernia without obstruction or gangrene: Secondary | ICD-10-CM | POA: Diagnosis not present

## 2019-08-23 DIAGNOSIS — E1122 Type 2 diabetes mellitus with diabetic chronic kidney disease: Secondary | ICD-10-CM | POA: Diagnosis not present

## 2019-08-23 DIAGNOSIS — I5030 Unspecified diastolic (congestive) heart failure: Secondary | ICD-10-CM | POA: Diagnosis not present

## 2019-08-23 DIAGNOSIS — N183 Chronic kidney disease, stage 3 unspecified: Secondary | ICD-10-CM | POA: Diagnosis not present

## 2019-08-23 DIAGNOSIS — L89152 Pressure ulcer of sacral region, stage 2: Secondary | ICD-10-CM | POA: Diagnosis not present

## 2019-08-23 DIAGNOSIS — I130 Hypertensive heart and chronic kidney disease with heart failure and stage 1 through stage 4 chronic kidney disease, or unspecified chronic kidney disease: Secondary | ICD-10-CM | POA: Diagnosis not present

## 2019-08-23 DIAGNOSIS — E441 Mild protein-calorie malnutrition: Secondary | ICD-10-CM | POA: Diagnosis not present

## 2019-08-25 DIAGNOSIS — L89152 Pressure ulcer of sacral region, stage 2: Secondary | ICD-10-CM | POA: Diagnosis not present

## 2019-08-25 DIAGNOSIS — I5030 Unspecified diastolic (congestive) heart failure: Secondary | ICD-10-CM | POA: Diagnosis not present

## 2019-08-25 DIAGNOSIS — N183 Chronic kidney disease, stage 3 unspecified: Secondary | ICD-10-CM | POA: Diagnosis not present

## 2019-08-25 DIAGNOSIS — I130 Hypertensive heart and chronic kidney disease with heart failure and stage 1 through stage 4 chronic kidney disease, or unspecified chronic kidney disease: Secondary | ICD-10-CM | POA: Diagnosis not present

## 2019-08-25 DIAGNOSIS — E1122 Type 2 diabetes mellitus with diabetic chronic kidney disease: Secondary | ICD-10-CM | POA: Diagnosis not present

## 2019-08-25 DIAGNOSIS — E441 Mild protein-calorie malnutrition: Secondary | ICD-10-CM | POA: Diagnosis not present

## 2019-08-30 DIAGNOSIS — E441 Mild protein-calorie malnutrition: Secondary | ICD-10-CM | POA: Diagnosis not present

## 2019-08-30 DIAGNOSIS — N183 Chronic kidney disease, stage 3 unspecified: Secondary | ICD-10-CM | POA: Diagnosis not present

## 2019-08-30 DIAGNOSIS — I130 Hypertensive heart and chronic kidney disease with heart failure and stage 1 through stage 4 chronic kidney disease, or unspecified chronic kidney disease: Secondary | ICD-10-CM | POA: Diagnosis not present

## 2019-08-30 DIAGNOSIS — L89152 Pressure ulcer of sacral region, stage 2: Secondary | ICD-10-CM | POA: Diagnosis not present

## 2019-08-30 DIAGNOSIS — E1122 Type 2 diabetes mellitus with diabetic chronic kidney disease: Secondary | ICD-10-CM | POA: Diagnosis not present

## 2019-08-30 DIAGNOSIS — I5030 Unspecified diastolic (congestive) heart failure: Secondary | ICD-10-CM | POA: Diagnosis not present

## 2019-09-01 DIAGNOSIS — N183 Chronic kidney disease, stage 3 unspecified: Secondary | ICD-10-CM | POA: Diagnosis not present

## 2019-09-01 DIAGNOSIS — E1122 Type 2 diabetes mellitus with diabetic chronic kidney disease: Secondary | ICD-10-CM | POA: Diagnosis not present

## 2019-09-01 DIAGNOSIS — E441 Mild protein-calorie malnutrition: Secondary | ICD-10-CM | POA: Diagnosis not present

## 2019-09-01 DIAGNOSIS — L89152 Pressure ulcer of sacral region, stage 2: Secondary | ICD-10-CM | POA: Diagnosis not present

## 2019-09-01 DIAGNOSIS — I5030 Unspecified diastolic (congestive) heart failure: Secondary | ICD-10-CM | POA: Diagnosis not present

## 2019-09-01 DIAGNOSIS — I130 Hypertensive heart and chronic kidney disease with heart failure and stage 1 through stage 4 chronic kidney disease, or unspecified chronic kidney disease: Secondary | ICD-10-CM | POA: Diagnosis not present

## 2019-09-04 DIAGNOSIS — I130 Hypertensive heart and chronic kidney disease with heart failure and stage 1 through stage 4 chronic kidney disease, or unspecified chronic kidney disease: Secondary | ICD-10-CM | POA: Diagnosis not present

## 2019-09-04 DIAGNOSIS — E441 Mild protein-calorie malnutrition: Secondary | ICD-10-CM | POA: Diagnosis not present

## 2019-09-04 DIAGNOSIS — E1122 Type 2 diabetes mellitus with diabetic chronic kidney disease: Secondary | ICD-10-CM | POA: Diagnosis not present

## 2019-09-04 DIAGNOSIS — L89152 Pressure ulcer of sacral region, stage 2: Secondary | ICD-10-CM | POA: Diagnosis not present

## 2019-09-04 DIAGNOSIS — N183 Chronic kidney disease, stage 3 unspecified: Secondary | ICD-10-CM | POA: Diagnosis not present

## 2019-09-04 DIAGNOSIS — I5030 Unspecified diastolic (congestive) heart failure: Secondary | ICD-10-CM | POA: Diagnosis not present

## 2019-09-06 DIAGNOSIS — N183 Chronic kidney disease, stage 3 unspecified: Secondary | ICD-10-CM | POA: Diagnosis not present

## 2019-09-06 DIAGNOSIS — E1122 Type 2 diabetes mellitus with diabetic chronic kidney disease: Secondary | ICD-10-CM | POA: Diagnosis not present

## 2019-09-06 DIAGNOSIS — I130 Hypertensive heart and chronic kidney disease with heart failure and stage 1 through stage 4 chronic kidney disease, or unspecified chronic kidney disease: Secondary | ICD-10-CM | POA: Diagnosis not present

## 2019-09-06 DIAGNOSIS — L89152 Pressure ulcer of sacral region, stage 2: Secondary | ICD-10-CM | POA: Diagnosis not present

## 2019-09-06 DIAGNOSIS — E441 Mild protein-calorie malnutrition: Secondary | ICD-10-CM | POA: Diagnosis not present

## 2019-09-06 DIAGNOSIS — I5030 Unspecified diastolic (congestive) heart failure: Secondary | ICD-10-CM | POA: Diagnosis not present

## 2019-09-13 DIAGNOSIS — N183 Chronic kidney disease, stage 3 unspecified: Secondary | ICD-10-CM | POA: Diagnosis not present

## 2019-09-13 DIAGNOSIS — E1122 Type 2 diabetes mellitus with diabetic chronic kidney disease: Secondary | ICD-10-CM | POA: Diagnosis not present

## 2019-09-13 DIAGNOSIS — E441 Mild protein-calorie malnutrition: Secondary | ICD-10-CM | POA: Diagnosis not present

## 2019-09-13 DIAGNOSIS — L89152 Pressure ulcer of sacral region, stage 2: Secondary | ICD-10-CM | POA: Diagnosis not present

## 2019-09-13 DIAGNOSIS — I5030 Unspecified diastolic (congestive) heart failure: Secondary | ICD-10-CM | POA: Diagnosis not present

## 2019-09-13 DIAGNOSIS — I130 Hypertensive heart and chronic kidney disease with heart failure and stage 1 through stage 4 chronic kidney disease, or unspecified chronic kidney disease: Secondary | ICD-10-CM | POA: Diagnosis not present

## 2019-09-14 DIAGNOSIS — E441 Mild protein-calorie malnutrition: Secondary | ICD-10-CM | POA: Diagnosis not present

## 2019-09-14 DIAGNOSIS — E1122 Type 2 diabetes mellitus with diabetic chronic kidney disease: Secondary | ICD-10-CM | POA: Diagnosis not present

## 2019-09-14 DIAGNOSIS — N183 Chronic kidney disease, stage 3 unspecified: Secondary | ICD-10-CM | POA: Diagnosis not present

## 2019-09-14 DIAGNOSIS — I5030 Unspecified diastolic (congestive) heart failure: Secondary | ICD-10-CM | POA: Diagnosis not present

## 2019-09-14 DIAGNOSIS — I130 Hypertensive heart and chronic kidney disease with heart failure and stage 1 through stage 4 chronic kidney disease, or unspecified chronic kidney disease: Secondary | ICD-10-CM | POA: Diagnosis not present

## 2019-09-14 DIAGNOSIS — L89152 Pressure ulcer of sacral region, stage 2: Secondary | ICD-10-CM | POA: Diagnosis not present

## 2019-09-20 DIAGNOSIS — M810 Age-related osteoporosis without current pathological fracture: Secondary | ICD-10-CM | POA: Diagnosis not present

## 2019-09-20 DIAGNOSIS — E1151 Type 2 diabetes mellitus with diabetic peripheral angiopathy without gangrene: Secondary | ICD-10-CM | POA: Diagnosis not present

## 2019-09-20 DIAGNOSIS — I5030 Unspecified diastolic (congestive) heart failure: Secondary | ICD-10-CM | POA: Diagnosis not present

## 2019-09-20 DIAGNOSIS — Z7984 Long term (current) use of oral hypoglycemic drugs: Secondary | ICD-10-CM | POA: Diagnosis not present

## 2019-09-20 DIAGNOSIS — E890 Postprocedural hypothyroidism: Secondary | ICD-10-CM | POA: Diagnosis not present

## 2019-09-20 DIAGNOSIS — I4891 Unspecified atrial fibrillation: Secondary | ICD-10-CM | POA: Diagnosis not present

## 2019-09-20 DIAGNOSIS — N183 Chronic kidney disease, stage 3 unspecified: Secondary | ICD-10-CM | POA: Diagnosis not present

## 2019-09-20 DIAGNOSIS — K219 Gastro-esophageal reflux disease without esophagitis: Secondary | ICD-10-CM | POA: Diagnosis not present

## 2019-09-20 DIAGNOSIS — L89152 Pressure ulcer of sacral region, stage 2: Secondary | ICD-10-CM | POA: Diagnosis not present

## 2019-09-20 DIAGNOSIS — E538 Deficiency of other specified B group vitamins: Secondary | ICD-10-CM | POA: Diagnosis not present

## 2019-09-20 DIAGNOSIS — Z87891 Personal history of nicotine dependence: Secondary | ICD-10-CM | POA: Diagnosis not present

## 2019-09-20 DIAGNOSIS — Z9181 History of falling: Secondary | ICD-10-CM | POA: Diagnosis not present

## 2019-09-20 DIAGNOSIS — E11319 Type 2 diabetes mellitus with unspecified diabetic retinopathy without macular edema: Secondary | ICD-10-CM | POA: Diagnosis not present

## 2019-09-20 DIAGNOSIS — Z7901 Long term (current) use of anticoagulants: Secondary | ICD-10-CM | POA: Diagnosis not present

## 2019-09-20 DIAGNOSIS — E1140 Type 2 diabetes mellitus with diabetic neuropathy, unspecified: Secondary | ICD-10-CM | POA: Diagnosis not present

## 2019-09-20 DIAGNOSIS — E785 Hyperlipidemia, unspecified: Secondary | ICD-10-CM | POA: Diagnosis not present

## 2019-09-20 DIAGNOSIS — E441 Mild protein-calorie malnutrition: Secondary | ICD-10-CM | POA: Diagnosis not present

## 2019-09-20 DIAGNOSIS — E559 Vitamin D deficiency, unspecified: Secondary | ICD-10-CM | POA: Diagnosis not present

## 2019-09-20 DIAGNOSIS — E1122 Type 2 diabetes mellitus with diabetic chronic kidney disease: Secondary | ICD-10-CM | POA: Diagnosis not present

## 2019-09-20 DIAGNOSIS — J439 Emphysema, unspecified: Secondary | ICD-10-CM | POA: Diagnosis not present

## 2019-09-20 DIAGNOSIS — F329 Major depressive disorder, single episode, unspecified: Secondary | ICD-10-CM | POA: Diagnosis not present

## 2019-09-20 DIAGNOSIS — I130 Hypertensive heart and chronic kidney disease with heart failure and stage 1 through stage 4 chronic kidney disease, or unspecified chronic kidney disease: Secondary | ICD-10-CM | POA: Diagnosis not present

## 2019-09-20 DIAGNOSIS — K449 Diaphragmatic hernia without obstruction or gangrene: Secondary | ICD-10-CM | POA: Diagnosis not present

## 2019-09-20 DIAGNOSIS — Z853 Personal history of malignant neoplasm of breast: Secondary | ICD-10-CM | POA: Diagnosis not present

## 2019-09-20 DIAGNOSIS — F419 Anxiety disorder, unspecified: Secondary | ICD-10-CM | POA: Diagnosis not present

## 2019-09-21 DIAGNOSIS — N183 Chronic kidney disease, stage 3 unspecified: Secondary | ICD-10-CM | POA: Diagnosis not present

## 2019-09-21 DIAGNOSIS — I130 Hypertensive heart and chronic kidney disease with heart failure and stage 1 through stage 4 chronic kidney disease, or unspecified chronic kidney disease: Secondary | ICD-10-CM | POA: Diagnosis not present

## 2019-09-21 DIAGNOSIS — E441 Mild protein-calorie malnutrition: Secondary | ICD-10-CM | POA: Diagnosis not present

## 2019-09-21 DIAGNOSIS — I5030 Unspecified diastolic (congestive) heart failure: Secondary | ICD-10-CM | POA: Diagnosis not present

## 2019-09-21 DIAGNOSIS — E1122 Type 2 diabetes mellitus with diabetic chronic kidney disease: Secondary | ICD-10-CM | POA: Diagnosis not present

## 2019-09-21 DIAGNOSIS — L89152 Pressure ulcer of sacral region, stage 2: Secondary | ICD-10-CM | POA: Diagnosis not present

## 2019-09-22 DIAGNOSIS — L89152 Pressure ulcer of sacral region, stage 2: Secondary | ICD-10-CM | POA: Diagnosis not present

## 2019-09-22 DIAGNOSIS — N183 Chronic kidney disease, stage 3 unspecified: Secondary | ICD-10-CM | POA: Diagnosis not present

## 2019-09-22 DIAGNOSIS — E441 Mild protein-calorie malnutrition: Secondary | ICD-10-CM | POA: Diagnosis not present

## 2019-09-22 DIAGNOSIS — E1122 Type 2 diabetes mellitus with diabetic chronic kidney disease: Secondary | ICD-10-CM | POA: Diagnosis not present

## 2019-09-22 DIAGNOSIS — I5030 Unspecified diastolic (congestive) heart failure: Secondary | ICD-10-CM | POA: Diagnosis not present

## 2019-09-22 DIAGNOSIS — I130 Hypertensive heart and chronic kidney disease with heart failure and stage 1 through stage 4 chronic kidney disease, or unspecified chronic kidney disease: Secondary | ICD-10-CM | POA: Diagnosis not present

## 2019-09-25 DIAGNOSIS — L89152 Pressure ulcer of sacral region, stage 2: Secondary | ICD-10-CM | POA: Diagnosis not present

## 2019-09-25 DIAGNOSIS — I130 Hypertensive heart and chronic kidney disease with heart failure and stage 1 through stage 4 chronic kidney disease, or unspecified chronic kidney disease: Secondary | ICD-10-CM | POA: Diagnosis not present

## 2019-09-25 DIAGNOSIS — E441 Mild protein-calorie malnutrition: Secondary | ICD-10-CM | POA: Diagnosis not present

## 2019-09-25 DIAGNOSIS — I5030 Unspecified diastolic (congestive) heart failure: Secondary | ICD-10-CM | POA: Diagnosis not present

## 2019-09-25 DIAGNOSIS — E1122 Type 2 diabetes mellitus with diabetic chronic kidney disease: Secondary | ICD-10-CM | POA: Diagnosis not present

## 2019-09-25 DIAGNOSIS — N183 Chronic kidney disease, stage 3 unspecified: Secondary | ICD-10-CM | POA: Diagnosis not present

## 2019-09-28 DIAGNOSIS — N183 Chronic kidney disease, stage 3 unspecified: Secondary | ICD-10-CM | POA: Diagnosis not present

## 2019-09-28 DIAGNOSIS — I5030 Unspecified diastolic (congestive) heart failure: Secondary | ICD-10-CM | POA: Diagnosis not present

## 2019-09-28 DIAGNOSIS — E441 Mild protein-calorie malnutrition: Secondary | ICD-10-CM | POA: Diagnosis not present

## 2019-09-28 DIAGNOSIS — E1122 Type 2 diabetes mellitus with diabetic chronic kidney disease: Secondary | ICD-10-CM | POA: Diagnosis not present

## 2019-09-28 DIAGNOSIS — I130 Hypertensive heart and chronic kidney disease with heart failure and stage 1 through stage 4 chronic kidney disease, or unspecified chronic kidney disease: Secondary | ICD-10-CM | POA: Diagnosis not present

## 2019-09-28 DIAGNOSIS — L89152 Pressure ulcer of sacral region, stage 2: Secondary | ICD-10-CM | POA: Diagnosis not present

## 2019-10-02 DIAGNOSIS — E1122 Type 2 diabetes mellitus with diabetic chronic kidney disease: Secondary | ICD-10-CM | POA: Diagnosis not present

## 2019-10-02 DIAGNOSIS — I5030 Unspecified diastolic (congestive) heart failure: Secondary | ICD-10-CM | POA: Diagnosis not present

## 2019-10-02 DIAGNOSIS — N183 Chronic kidney disease, stage 3 unspecified: Secondary | ICD-10-CM | POA: Diagnosis not present

## 2019-10-02 DIAGNOSIS — E441 Mild protein-calorie malnutrition: Secondary | ICD-10-CM | POA: Diagnosis not present

## 2019-10-02 DIAGNOSIS — I130 Hypertensive heart and chronic kidney disease with heart failure and stage 1 through stage 4 chronic kidney disease, or unspecified chronic kidney disease: Secondary | ICD-10-CM | POA: Diagnosis not present

## 2019-10-02 DIAGNOSIS — L89152 Pressure ulcer of sacral region, stage 2: Secondary | ICD-10-CM | POA: Diagnosis not present

## 2019-10-09 DIAGNOSIS — H838X1 Other specified diseases of right inner ear: Secondary | ICD-10-CM | POA: Diagnosis not present

## 2019-10-09 DIAGNOSIS — H9071 Mixed conductive and sensorineural hearing loss, unilateral, right ear, with unrestricted hearing on the contralateral side: Secondary | ICD-10-CM | POA: Diagnosis not present

## 2019-10-09 DIAGNOSIS — H6981 Other specified disorders of Eustachian tube, right ear: Secondary | ICD-10-CM | POA: Diagnosis not present

## 2019-10-11 DIAGNOSIS — E441 Mild protein-calorie malnutrition: Secondary | ICD-10-CM | POA: Diagnosis not present

## 2019-10-11 DIAGNOSIS — I130 Hypertensive heart and chronic kidney disease with heart failure and stage 1 through stage 4 chronic kidney disease, or unspecified chronic kidney disease: Secondary | ICD-10-CM | POA: Diagnosis not present

## 2019-10-11 DIAGNOSIS — N183 Chronic kidney disease, stage 3 unspecified: Secondary | ICD-10-CM | POA: Diagnosis not present

## 2019-10-11 DIAGNOSIS — I5030 Unspecified diastolic (congestive) heart failure: Secondary | ICD-10-CM | POA: Diagnosis not present

## 2019-10-11 DIAGNOSIS — L89152 Pressure ulcer of sacral region, stage 2: Secondary | ICD-10-CM | POA: Diagnosis not present

## 2019-10-11 DIAGNOSIS — E1122 Type 2 diabetes mellitus with diabetic chronic kidney disease: Secondary | ICD-10-CM | POA: Diagnosis not present

## 2019-10-17 DIAGNOSIS — L89152 Pressure ulcer of sacral region, stage 2: Secondary | ICD-10-CM | POA: Diagnosis not present

## 2019-10-17 DIAGNOSIS — I130 Hypertensive heart and chronic kidney disease with heart failure and stage 1 through stage 4 chronic kidney disease, or unspecified chronic kidney disease: Secondary | ICD-10-CM | POA: Diagnosis not present

## 2019-10-17 DIAGNOSIS — N183 Chronic kidney disease, stage 3 unspecified: Secondary | ICD-10-CM | POA: Diagnosis not present

## 2019-10-17 DIAGNOSIS — I5030 Unspecified diastolic (congestive) heart failure: Secondary | ICD-10-CM | POA: Diagnosis not present

## 2019-10-17 DIAGNOSIS — E1122 Type 2 diabetes mellitus with diabetic chronic kidney disease: Secondary | ICD-10-CM | POA: Diagnosis not present

## 2019-10-17 DIAGNOSIS — E441 Mild protein-calorie malnutrition: Secondary | ICD-10-CM | POA: Diagnosis not present

## 2019-10-30 DIAGNOSIS — E039 Hypothyroidism, unspecified: Secondary | ICD-10-CM | POA: Diagnosis not present

## 2019-10-30 DIAGNOSIS — E611 Iron deficiency: Secondary | ICD-10-CM | POA: Diagnosis not present

## 2019-10-30 DIAGNOSIS — Z7984 Long term (current) use of oral hypoglycemic drugs: Secondary | ICD-10-CM | POA: Diagnosis not present

## 2019-10-30 DIAGNOSIS — I10 Essential (primary) hypertension: Secondary | ICD-10-CM | POA: Diagnosis not present

## 2019-10-30 DIAGNOSIS — E785 Hyperlipidemia, unspecified: Secondary | ICD-10-CM | POA: Diagnosis not present

## 2019-10-30 DIAGNOSIS — E559 Vitamin D deficiency, unspecified: Secondary | ICD-10-CM | POA: Diagnosis not present

## 2019-10-30 DIAGNOSIS — E1140 Type 2 diabetes mellitus with diabetic neuropathy, unspecified: Secondary | ICD-10-CM | POA: Diagnosis not present

## 2019-10-30 DIAGNOSIS — Z7901 Long term (current) use of anticoagulants: Secondary | ICD-10-CM | POA: Diagnosis not present

## 2019-11-01 DIAGNOSIS — E1122 Type 2 diabetes mellitus with diabetic chronic kidney disease: Secondary | ICD-10-CM | POA: Diagnosis not present

## 2019-11-01 DIAGNOSIS — I480 Paroxysmal atrial fibrillation: Secondary | ICD-10-CM | POA: Diagnosis not present

## 2019-11-01 DIAGNOSIS — N2581 Secondary hyperparathyroidism of renal origin: Secondary | ICD-10-CM | POA: Diagnosis not present

## 2019-11-01 DIAGNOSIS — N184 Chronic kidney disease, stage 4 (severe): Secondary | ICD-10-CM | POA: Diagnosis not present

## 2019-11-01 DIAGNOSIS — D631 Anemia in chronic kidney disease: Secondary | ICD-10-CM | POA: Diagnosis not present

## 2019-11-01 DIAGNOSIS — I129 Hypertensive chronic kidney disease with stage 1 through stage 4 chronic kidney disease, or unspecified chronic kidney disease: Secondary | ICD-10-CM | POA: Diagnosis not present

## 2019-11-01 DIAGNOSIS — E785 Hyperlipidemia, unspecified: Secondary | ICD-10-CM | POA: Diagnosis not present

## 2019-11-07 DIAGNOSIS — N184 Chronic kidney disease, stage 4 (severe): Secondary | ICD-10-CM | POA: Diagnosis not present

## 2019-11-07 DIAGNOSIS — Z7901 Long term (current) use of anticoagulants: Secondary | ICD-10-CM | POA: Diagnosis not present

## 2019-11-15 DIAGNOSIS — Z7901 Long term (current) use of anticoagulants: Secondary | ICD-10-CM | POA: Diagnosis not present

## 2019-11-15 DIAGNOSIS — I10 Essential (primary) hypertension: Secondary | ICD-10-CM | POA: Diagnosis not present

## 2019-11-20 ENCOUNTER — Other Ambulatory Visit (HOSPITAL_COMMUNITY): Payer: Self-pay | Admitting: *Deleted

## 2019-11-21 ENCOUNTER — Encounter (HOSPITAL_COMMUNITY)
Admission: RE | Admit: 2019-11-21 | Discharge: 2019-11-21 | Disposition: A | Discharge status: Home / Self Care | Payer: Medicare Other | Source: Ambulatory Visit | Attending: Nephrology | Admitting: Nephrology

## 2019-11-21 ENCOUNTER — Other Ambulatory Visit: Payer: Self-pay

## 2019-11-21 DIAGNOSIS — D631 Anemia in chronic kidney disease: Secondary | ICD-10-CM | POA: Diagnosis not present

## 2019-11-21 DIAGNOSIS — N184 Chronic kidney disease, stage 4 (severe): Secondary | ICD-10-CM | POA: Insufficient documentation

## 2019-11-21 MED ORDER — SODIUM CHLORIDE 0.9 % IV SOLN
510.0000 mg | INTRAVENOUS | Status: DC
  Administered 2019-11-21: 510 mg via INTRAVENOUS
  Filled 2019-11-21: qty 17

## 2019-11-28 ENCOUNTER — Encounter (HOSPITAL_COMMUNITY)
Admission: RE | Admit: 2019-11-28 | Discharge: 2019-11-28 | Disposition: A | Discharge status: Home / Self Care | Payer: Medicare Other | Source: Ambulatory Visit | Attending: Nephrology | Admitting: Nephrology

## 2019-11-28 ENCOUNTER — Other Ambulatory Visit: Payer: Self-pay

## 2019-11-28 DIAGNOSIS — D631 Anemia in chronic kidney disease: Secondary | ICD-10-CM | POA: Diagnosis not present

## 2019-11-28 DIAGNOSIS — Z23 Encounter for immunization: Secondary | ICD-10-CM | POA: Diagnosis not present

## 2019-11-28 DIAGNOSIS — Z7901 Long term (current) use of anticoagulants: Secondary | ICD-10-CM | POA: Diagnosis not present

## 2019-11-28 DIAGNOSIS — N184 Chronic kidney disease, stage 4 (severe): Secondary | ICD-10-CM | POA: Diagnosis not present

## 2019-11-28 MED ORDER — SODIUM CHLORIDE 0.9 % IV SOLN
510.0000 mg | INTRAVENOUS | Status: DC
  Administered 2019-11-28: 510 mg via INTRAVENOUS
  Filled 2019-11-28: qty 17

## 2019-12-07 DIAGNOSIS — R627 Adult failure to thrive: Secondary | ICD-10-CM | POA: Diagnosis not present

## 2020-03-09 DEATH — deceased
